# Patient Record
Sex: Male | Born: 1945 | Race: White | Hispanic: No | Marital: Single | Smoking: Current every day smoker
Health system: Southern US, Community
[De-identification: ages and names within clinical notes are randomized; demographics above are authoritative.]

## PROBLEM LIST (undated history)

## (undated) DIAGNOSIS — D696 Thrombocytopenia, unspecified: Secondary | ICD-10-CM

## (undated) DIAGNOSIS — I10 Essential (primary) hypertension: Secondary | ICD-10-CM

## (undated) DIAGNOSIS — F329 Major depressive disorder, single episode, unspecified: Secondary | ICD-10-CM

## (undated) DIAGNOSIS — Z8719 Personal history of other diseases of the digestive system: Secondary | ICD-10-CM

## (undated) DIAGNOSIS — N401 Enlarged prostate with lower urinary tract symptoms: Secondary | ICD-10-CM

## (undated) DIAGNOSIS — K219 Gastro-esophageal reflux disease without esophagitis: Secondary | ICD-10-CM

## (undated) DIAGNOSIS — K760 Fatty (change of) liver, not elsewhere classified: Secondary | ICD-10-CM

## (undated) DIAGNOSIS — C61 Malignant neoplasm of prostate: Secondary | ICD-10-CM

## (undated) DIAGNOSIS — E119 Type 2 diabetes mellitus without complications: Secondary | ICD-10-CM

## (undated) DIAGNOSIS — R3912 Poor urinary stream: Secondary | ICD-10-CM

## (undated) DIAGNOSIS — Z86718 Personal history of other venous thrombosis and embolism: Secondary | ICD-10-CM

## (undated) DIAGNOSIS — F419 Anxiety disorder, unspecified: Secondary | ICD-10-CM

## (undated) DIAGNOSIS — E781 Pure hyperglyceridemia: Secondary | ICD-10-CM

## (undated) DIAGNOSIS — J302 Other seasonal allergic rhinitis: Secondary | ICD-10-CM

## (undated) DIAGNOSIS — K5730 Diverticulosis of large intestine without perforation or abscess without bleeding: Secondary | ICD-10-CM

## (undated) DIAGNOSIS — M1990 Unspecified osteoarthritis, unspecified site: Secondary | ICD-10-CM

## (undated) DIAGNOSIS — F32A Depression, unspecified: Secondary | ICD-10-CM

## (undated) DIAGNOSIS — Z95828 Presence of other vascular implants and grafts: Secondary | ICD-10-CM

## (undated) DIAGNOSIS — G629 Polyneuropathy, unspecified: Secondary | ICD-10-CM

## (undated) DIAGNOSIS — Z973 Presence of spectacles and contact lenses: Secondary | ICD-10-CM

## (undated) HISTORY — PX: OTHER SURGICAL HISTORY: SHX169

## (undated) HISTORY — DX: Polyneuropathy, unspecified: G62.9

## (undated) HISTORY — PX: PROSTATE BIOPSY: SHX241

## (undated) HISTORY — DX: Essential (primary) hypertension: I10

---

## 1971-01-06 HISTORY — PX: CHOLECYSTECTOMY OPEN: SUR202

## 2003-05-12 ENCOUNTER — Emergency Department (HOSPITAL_COMMUNITY): Admission: EM | Admit: 2003-05-12 | Discharge: 2003-05-12 | Payer: Self-pay | Admitting: Emergency Medicine

## 2004-03-27 ENCOUNTER — Ambulatory Visit: Payer: Self-pay | Admitting: Internal Medicine

## 2004-03-27 ENCOUNTER — Ambulatory Visit (HOSPITAL_COMMUNITY): Admission: RE | Admit: 2004-03-27 | Discharge: 2004-03-27 | Payer: Self-pay | Admitting: Internal Medicine

## 2005-04-07 ENCOUNTER — Emergency Department (HOSPITAL_COMMUNITY): Admission: EM | Admit: 2005-04-07 | Discharge: 2005-04-07 | Payer: Self-pay | Admitting: Emergency Medicine

## 2005-11-15 ENCOUNTER — Inpatient Hospital Stay (HOSPITAL_COMMUNITY): Admission: EM | Admit: 2005-11-15 | Discharge: 2005-11-24 | Payer: Self-pay | Admitting: Emergency Medicine

## 2005-11-15 ENCOUNTER — Ambulatory Visit: Payer: Self-pay | Admitting: Internal Medicine

## 2005-12-11 ENCOUNTER — Ambulatory Visit: Payer: Self-pay | Admitting: Internal Medicine

## 2005-12-21 ENCOUNTER — Ambulatory Visit (HOSPITAL_COMMUNITY): Admission: RE | Admit: 2005-12-21 | Discharge: 2005-12-21 | Payer: Self-pay | Admitting: Internal Medicine

## 2006-01-07 ENCOUNTER — Ambulatory Visit (HOSPITAL_COMMUNITY): Admission: RE | Admit: 2006-01-07 | Discharge: 2006-01-07 | Payer: Self-pay | Admitting: General Surgery

## 2006-03-09 ENCOUNTER — Inpatient Hospital Stay (HOSPITAL_COMMUNITY): Admission: AD | Admit: 2006-03-09 | Discharge: 2006-03-19 | Payer: Self-pay | Admitting: Pulmonary Disease

## 2006-03-10 ENCOUNTER — Encounter (INDEPENDENT_AMBULATORY_CARE_PROVIDER_SITE_OTHER): Payer: Self-pay | Admitting: Specialist

## 2006-04-01 ENCOUNTER — Ambulatory Visit (HOSPITAL_COMMUNITY): Admission: RE | Admit: 2006-04-01 | Discharge: 2006-04-01 | Payer: Self-pay | Admitting: General Surgery

## 2006-04-07 ENCOUNTER — Ambulatory Visit (HOSPITAL_COMMUNITY): Admission: RE | Admit: 2006-04-07 | Discharge: 2006-04-07 | Payer: Self-pay | Admitting: Pulmonary Disease

## 2006-06-06 DIAGNOSIS — Z86718 Personal history of other venous thrombosis and embolism: Secondary | ICD-10-CM

## 2006-06-06 HISTORY — DX: Personal history of other venous thrombosis and embolism: Z86.718

## 2006-06-11 ENCOUNTER — Ambulatory Visit: Payer: Self-pay | Admitting: Internal Medicine

## 2006-06-11 ENCOUNTER — Encounter (HOSPITAL_COMMUNITY): Admission: RE | Admit: 2006-06-11 | Discharge: 2006-07-11 | Payer: Self-pay | Admitting: Pulmonary Disease

## 2006-06-11 ENCOUNTER — Ambulatory Visit (HOSPITAL_COMMUNITY): Payer: Self-pay | Admitting: Pulmonary Disease

## 2006-06-11 ENCOUNTER — Ambulatory Visit (HOSPITAL_COMMUNITY): Admission: RE | Admit: 2006-06-11 | Discharge: 2006-06-11 | Payer: Self-pay | Admitting: Pulmonary Disease

## 2006-06-25 ENCOUNTER — Inpatient Hospital Stay (HOSPITAL_COMMUNITY): Admission: AD | Admit: 2006-06-25 | Discharge: 2006-06-30 | Payer: Self-pay | Admitting: Pulmonary Disease

## 2006-06-28 ENCOUNTER — Encounter: Payer: Self-pay | Admitting: Internal Medicine

## 2006-06-28 ENCOUNTER — Ambulatory Visit: Payer: Self-pay | Admitting: Internal Medicine

## 2006-06-29 ENCOUNTER — Ambulatory Visit (HOSPITAL_COMMUNITY): Admission: RE | Admit: 2006-06-29 | Discharge: 2006-06-29 | Payer: Self-pay | Admitting: Family Medicine

## 2006-07-02 DIAGNOSIS — Z95828 Presence of other vascular implants and grafts: Secondary | ICD-10-CM

## 2006-07-02 HISTORY — DX: Presence of other vascular implants and grafts: Z95.828

## 2006-07-02 HISTORY — PX: IVC FILTER INSERTION: CATH118245

## 2007-05-12 ENCOUNTER — Ambulatory Visit (HOSPITAL_COMMUNITY): Admission: RE | Admit: 2007-05-12 | Discharge: 2007-05-12 | Payer: Self-pay | Admitting: Pulmonary Disease

## 2007-05-13 ENCOUNTER — Encounter (HOSPITAL_COMMUNITY): Admission: RE | Admit: 2007-05-13 | Discharge: 2007-06-12 | Payer: Self-pay | Admitting: Pulmonary Disease

## 2007-05-13 ENCOUNTER — Ambulatory Visit (HOSPITAL_COMMUNITY): Payer: Self-pay | Admitting: Pulmonary Disease

## 2007-07-21 ENCOUNTER — Ambulatory Visit (HOSPITAL_COMMUNITY): Admission: RE | Admit: 2007-07-21 | Discharge: 2007-07-21 | Payer: Self-pay | Admitting: Pulmonary Disease

## 2007-07-22 ENCOUNTER — Encounter (HOSPITAL_COMMUNITY): Admission: RE | Admit: 2007-07-22 | Discharge: 2007-08-21 | Payer: Self-pay | Admitting: Oncology

## 2007-07-22 ENCOUNTER — Ambulatory Visit (HOSPITAL_COMMUNITY): Payer: Self-pay | Admitting: Pulmonary Disease

## 2008-01-06 HISTORY — PX: CATARACT EXTRACTION W/ INTRAOCULAR LENS  IMPLANT, BILATERAL: SHX1307

## 2008-08-06 ENCOUNTER — Encounter: Payer: Self-pay | Admitting: Cardiology

## 2008-08-09 ENCOUNTER — Encounter (INDEPENDENT_AMBULATORY_CARE_PROVIDER_SITE_OTHER): Payer: Self-pay | Admitting: *Deleted

## 2008-08-20 DIAGNOSIS — E119 Type 2 diabetes mellitus without complications: Secondary | ICD-10-CM

## 2008-08-20 DIAGNOSIS — K644 Residual hemorrhoidal skin tags: Secondary | ICD-10-CM | POA: Insufficient documentation

## 2008-08-20 DIAGNOSIS — Z860100 Personal history of colon polyps, unspecified: Secondary | ICD-10-CM | POA: Insufficient documentation

## 2008-08-20 DIAGNOSIS — E785 Hyperlipidemia, unspecified: Secondary | ICD-10-CM

## 2008-08-20 DIAGNOSIS — I10 Essential (primary) hypertension: Secondary | ICD-10-CM

## 2008-08-20 DIAGNOSIS — F17200 Nicotine dependence, unspecified, uncomplicated: Secondary | ICD-10-CM | POA: Insufficient documentation

## 2008-08-21 ENCOUNTER — Ambulatory Visit: Payer: Self-pay | Admitting: Internal Medicine

## 2008-08-21 DIAGNOSIS — K5289 Other specified noninfective gastroenteritis and colitis: Secondary | ICD-10-CM

## 2008-08-21 DIAGNOSIS — K5732 Diverticulitis of large intestine without perforation or abscess without bleeding: Secondary | ICD-10-CM

## 2008-08-22 ENCOUNTER — Encounter: Payer: Self-pay | Admitting: Internal Medicine

## 2008-08-22 DIAGNOSIS — R197 Diarrhea, unspecified: Secondary | ICD-10-CM

## 2008-08-24 ENCOUNTER — Encounter: Payer: Self-pay | Admitting: Internal Medicine

## 2008-08-29 ENCOUNTER — Ambulatory Visit (HOSPITAL_COMMUNITY): Admission: RE | Admit: 2008-08-29 | Discharge: 2008-08-29 | Payer: Self-pay | Admitting: Internal Medicine

## 2008-08-29 ENCOUNTER — Ambulatory Visit: Payer: Self-pay | Admitting: Internal Medicine

## 2008-08-29 ENCOUNTER — Encounter: Payer: Self-pay | Admitting: Internal Medicine

## 2008-08-30 ENCOUNTER — Encounter: Payer: Self-pay | Admitting: Internal Medicine

## 2008-08-31 ENCOUNTER — Encounter: Payer: Self-pay | Admitting: Internal Medicine

## 2008-10-09 ENCOUNTER — Ambulatory Visit: Payer: Self-pay | Admitting: Internal Medicine

## 2008-10-14 LAB — CONVERTED CEMR LAB
IgA: 366 mg/dL (ref 68–378)
Tissue Transglutaminase Ab, IgA: 0.9 units (ref <7)

## 2008-11-27 ENCOUNTER — Ambulatory Visit (HOSPITAL_COMMUNITY): Admission: RE | Admit: 2008-11-27 | Discharge: 2008-11-27 | Payer: Self-pay | Admitting: Ophthalmology

## 2008-12-18 ENCOUNTER — Ambulatory Visit (HOSPITAL_COMMUNITY): Admission: RE | Admit: 2008-12-18 | Discharge: 2008-12-18 | Payer: Self-pay | Admitting: Ophthalmology

## 2010-03-28 ENCOUNTER — Emergency Department (HOSPITAL_COMMUNITY): Payer: Medicare Other

## 2010-03-28 ENCOUNTER — Emergency Department (HOSPITAL_COMMUNITY)
Admission: EM | Admit: 2010-03-28 | Discharge: 2010-03-28 | Disposition: A | Discharge status: Home / Self Care | Payer: Medicare Other | Attending: Emergency Medicine | Admitting: Emergency Medicine

## 2010-03-28 DIAGNOSIS — IMO0002 Reserved for concepts with insufficient information to code with codable children: Secondary | ICD-10-CM | POA: Insufficient documentation

## 2010-03-28 DIAGNOSIS — S2249XA Multiple fractures of ribs, unspecified side, initial encounter for closed fracture: Secondary | ICD-10-CM | POA: Insufficient documentation

## 2010-03-28 DIAGNOSIS — Y92009 Unspecified place in unspecified non-institutional (private) residence as the place of occurrence of the external cause: Secondary | ICD-10-CM | POA: Insufficient documentation

## 2010-03-28 DIAGNOSIS — S42033A Displaced fracture of lateral end of unspecified clavicle, initial encounter for closed fracture: Secondary | ICD-10-CM | POA: Insufficient documentation

## 2010-03-28 DIAGNOSIS — E119 Type 2 diabetes mellitus without complications: Secondary | ICD-10-CM | POA: Insufficient documentation

## 2010-03-28 DIAGNOSIS — I10 Essential (primary) hypertension: Secondary | ICD-10-CM | POA: Insufficient documentation

## 2010-03-28 DIAGNOSIS — E785 Hyperlipidemia, unspecified: Secondary | ICD-10-CM | POA: Insufficient documentation

## 2010-03-28 DIAGNOSIS — Z79899 Other long term (current) drug therapy: Secondary | ICD-10-CM | POA: Insufficient documentation

## 2010-03-28 LAB — URINALYSIS, ROUTINE W REFLEX MICROSCOPIC
Glucose, UA: >1000 mg/dL — AB
Leukocytes, UA: NEGATIVE
Nitrite: NEGATIVE
pH: 5 (ref 5.0–8.0)

## 2010-03-28 LAB — URINE MICROSCOPIC-ADD ON

## 2010-04-09 LAB — CBC
HCT: 41 % (ref 39.0–52.0)
MCHC: 35.4 g/dL (ref 30.0–36.0)
MCV: 104.1 fL — ABNORMAL HIGH (ref 78.0–100.0)
Platelets: 80 10*3/uL — ABNORMAL LOW (ref 150–400)
RDW: 17 % — ABNORMAL HIGH (ref 11.5–15.5)

## 2010-04-09 LAB — BASIC METABOLIC PANEL
BUN: 5 mg/dL — ABNORMAL LOW (ref 6–23)
CO2: 28 mEq/L (ref 19–32)
GFR calc non Af Amer: >60 mL/min (ref >60)
Glucose, Bld: 243 mg/dL — ABNORMAL HIGH (ref 70–99)
Potassium: 3.2 mEq/L — ABNORMAL LOW (ref 3.5–5.1)

## 2010-04-09 LAB — POCT I-STAT 4, (NA,K, GLUC, HGB,HCT): Glucose, Bld: 162 mg/dL — ABNORMAL HIGH (ref 70–99)

## 2010-04-12 LAB — OVA AND PARASITE EXAMINATION: Ova and parasites: NONE SEEN

## 2010-04-12 LAB — STOOL CULTURE

## 2010-04-12 LAB — GLUCOSE, CAPILLARY: Glucose-Capillary: 171 mg/dL — ABNORMAL HIGH (ref 70–99)

## 2010-04-12 LAB — FECAL LACTOFERRIN, QUANT: Fecal Lactoferrin: NEGATIVE

## 2010-05-20 NOTE — Op Note (Signed)
NAME:  [name], [name]            ACCOUNT NO.:  [id number]   MEDICAL RECORD NO.:  [id number]          PATIENT TYPE:  AMB   LOCATION:  DAY                           FACILITY:  [facility]   PHYSICIAN:  R. Roetta Sessions, M.D. DATE OF BIRTH:  1945-04-12   DATE OF PROCEDURE:  08/29/2008  DATE OF DISCHARGE:                               OPERATIVE REPORT   PROCEDURE:  Colonoscopy with snare polypectomy, segmental biopsy, stool  sampling.   INDICATIONS FOR PROCEDURE:  A 65 year old gentleman with a history of  nonbloody diarrhea and a history of some degree of colitis, seen at  prior colonoscopies.  Also has a history of colonic adenomas.  He comes  for further evaluation of diarrhea and, in part, for polyp surveillance.  Risks, benefits, alternatives and limitations have been discussed,  questions answered.  Please see the documentation in the medical record.   PROCEDURE NOTE:  O2 saturation, blood pressure, pulse and respirations  were monitored throughout entirety of the procedure.   CONSCIOUS SEDATION:  Versed 5 mg IV, Demerol 75 mg IV in divided doses.   INSTRUMENT:  Pentax video chip system.   FINDINGS:  Digital rectal exam revealed no abnormalities on scout  findings.  The prep was good. Colon:  Colonic mucosa was surveyed from  the rectosigmoid junction to the left transverse, right colon,  appendiceal orifice, ileocecal valve, appendiceal orifice/cecum.  These  structures were well-seen and photographed for the record.  Terminal  ileum was intubated 5 cm.  From this level, the scope was slowly and  cautiously withdrawn.  All previously mentioned mucosal surfaces were  again seen.  The patient has scattered pan colonic diverticula.  Surgical anastomosis identified at 25 cm from the anal verge.  The  terminal ileal mucosa appeared normal.  The patient had multiple polyps  in the ascending and descending segments, which were hot and cold  snared.  Also segmental biopsies of the  ascending and sigmoid were taken  to rule out colitis, but otherwise the colonic mucosa appeared normal.  Scope was pulled down into the rectum, where thorough examination of the  rectal mucosa, including retroflexed view of the anal verge,  demonstrated no abnormalities.  Rectal biopsies were also taken.  A  fresh stool specimen was submitted to the microbiology lab.  The patient  also had some minimal internal hemorrhoids.  The patient tolerated the  procedure well.  Cecal withdrawal time 19 minutes.   IMPRESSION:  1. Minimal anal canal internal hemorrhoids, otherwise normal-appearing      rectum, status post biopsy.  2. Surgical anastomosis 25 cm.  3. Pancolonic diverticula.  4. Multiple polyps in the descending and ascending segments, status      post hot and cold snare polypectomies.  5. The remaining colonic mucosa appeared normal, status post stool      sampling and segmental biopsies described above.  6. Normal terminal ileum.  7. Cecal withdrawal time 19 minutes.   RECOMMENDATIONS:  1. Diverticulosis, hemorrhoids and polyp literature provided to Mr.      Lienau.  2. No aspirin or NSAIDs for next 5 days.  3. Follow-up on pathology and stool samples.  4. Further recommendations to follow.      Jonathon Bellows, M.D.  Electronically Signed     RMR/MEDQ  D:  08/29/2008  T:  08/29/2008  Job:  [id number]   cc:   Ramon Dredge L. Juanetta Gosling, M.D.  Fax: [phone]

## 2010-05-20 NOTE — Group Therapy Note (Signed)
NAMEKENZO, OZMENT            ACCOUNT NO.:  [id number]   MEDICAL RECORD NO.:  [id number]          PATIENT TYPE:  INP   LOCATION:  206                           FACILITY:  [facility]   PHYSICIAN:  Edward L. Juanetta Gosling, M.D.DATE OF BIRTH:  1946/01/04   DATE OF PROCEDURE:  DATE OF DISCHARGE:                                 PROGRESS NOTE   Mr. Jeff is better.  He did not sleep well last night but otherwise  he is okay.   EXAM:  Temperature is 97.6, pulse 77, respirations 20, blood pressure  153/90, O2 sats 96% on room air.  His hemoglobin 10.9, white count is  3000.  His chest is clear.  He looks better.   ASSESSMENT:  He is much improved.   PLAN:  He is set for a venous filter today.      Edward L. Juanetta Gosling, M.D.  Electronically Signed     ELH/MEDQ  D:  06/29/2006  T:  06/29/2006  Job:  [id number]

## 2010-05-20 NOTE — Group Therapy Note (Signed)
[name], [name]            ACCOUNT NO.:  [id number]   MEDICAL RECORD NO.:  [id number]          PATIENT TYPE:  INP   LOCATION:  A206                          FACILITY:  [facility]   PHYSICIAN:  Edward L. Juanetta Gosling, M.D.DATE OF BIRTH:  Oct 01, 1945   DATE OF PROCEDURE:  DATE OF DISCHARGE:                                 PROGRESS NOTE   [name] says he is feeling okay.  He is set for EGD and  colonoscopy today.  He has no other new complaints.   PHYSICAL EXAMINATION:  VITAL SIGNS:  This morning shows temperature  97.9, pulse 74, respirations 20, blood pressure 151/90, O2 sats 95% on  room air, blood sugar 99.  CHEST:  Clear.  HEART:  Regular.  ABDOMEN:  Soft.   He is apparently not having much bleeding anymore.  His hemoglobin level  is up at 10.5, platelets 140, white count still low at 2600.   ASSESSMENT:  I think he has significant liver problem related to his  history of alcohol use, and I suspect that that is why his prothrombin  time went out so far so fast.  It was so difficult to correct.  I am not  going to plan to put him back on Coumadin.  I am going to see about  getting a venal cava filter done.  His white blood count is low.  This  has been a chronic problem as well.  He is to have colonoscopy and  perhaps EGD today.      Edward L. Juanetta Gosling, M.D.  Electronically Signed     ELH/MEDQ  D:  06/28/2006  T:  06/28/2006  Job:  [id number]

## 2010-05-20 NOTE — Op Note (Signed)
NAME:  [name], [name]            ACCOUNT NO.:  [id number]   MEDICAL RECORD NO.:  [id number]          PATIENT TYPE:  INP   LOCATION:  A206                          FACILITY:  [facility]   PHYSICIAN:  R. Roetta Sessions, M.D. DATE OF BIRTH:  1945-02-11   DATE OF PROCEDURE:  06/28/2006  DATE OF DISCHARGE:                               OPERATIVE REPORT   PROCEDURE:  Colonoscopy with ileoscopy, snare polypectomy, biopsy.   INDICATIONS FOR PROCEDURE:  65 year old Caucasian male recently started  on Coumadin, presenting coagulopathic with hematochezia requiring  transfusion.  He required reversal of coagulopathy with vitamin K and  FFP and remains hemodynamically stable. Colonoscopy is now being done  with possible EGD to follow. As discussed potential risks, benefits and  alternatives have been reviewed.  Please see documentation in medical  record.   PROCEDURE NOTE:  O2 saturation, blood pressure, pulse and respirations  were monitored throughout the entire procedure.  Conscious sedation  Versed 4 mg IV, Demerol 75 mg in divided doses.  Instrument Pentax video  chip system.  Digital rectal exam revealed no abnormalities.   ENDOSCOPIC FINDINGS:  Prep was suboptimal. Colon:  Colon mucosa was  surveyed from the rectosigmoid junction through the left transverse,  right colon to appendiceal orifice, ileocecal valve and cecum.  The  structures well seen photographed for the record.  Terminal ileum was  intubated at 10 cm. From this level scope was withdrawn.  Previous  mucosal surfaces were again seen.  The patient had no residual pan  colonic diverticula.  Surgical anastomosis identified at 25 cm appeared  normal.  The patient multiple pedunculated polyps. Lesions were in the  ascending and left colon. Largest was 1 cm in the descending colon. They  were all hot snare removed.  There was one in the ascending colon wanted  to ooze after polypectomy was performed requiring one resolution clip  with good hemostasis.  The remainder of the colonic mucosa appeared  normal.  In the terminal ileum there was one 3 mm protuberant polyps on  folds likely not clinically significant however, one was biopsied.   The scope was pulled down the rectum where thorough examination of the  rectal mucosa including retroflex view anal verge demonstrated no  abnormalities.   Please note there was no blood in the lower GI tract.  The patient  tolerated the procedure very well and was reactive in endoscopy.   IMPRESSION:  Normal rectum status post prior sigmoid resection and  anastomosis appeared normal at 25 cm, multiple pedunculated colonic  polyps and residual pan colonic diverticula.  All polyps removed with  snare.  One oozing polyp in the ascending colon required resolution  clipping. The diminutive polyps in the terminal ileum of uncertain  significance, one biopsied.   Suspect the patient did suffer lower GI tract etiology bleed secondary  to polyps and or diverticula in setting of marked over anticoagulation.   RECOMMENDATIONS:  1. Agree with no more anticoagulation and pursuing of a [place]      filter.  2. Will allow advancement diet, n.p.o. past midnight in light for  filter placement tomorrow. No MRI until clip is known to have      passed.  3. CBC in the morning.  4. Discussed findings and recommendations with Edward L. Juanetta Gosling, M.D.  5. As a separate issue would consider further evaluation of his      pancytopenia.  6. Follow-up on pathology.      Jonathon Bellows, M.D.  Electronically Signed     RMR/MEDQ  D:  06/28/2006  T:  06/28/2006  Job:  [id number]   cc:   Ramon Dredge L. Juanetta Gosling, M.D.  Fax: [phone]   Barbaraann Barthel, M.D.  Fax: [phone]   Ladona Horns. Mariel Sleet, MD  Fax: [phone]

## 2010-05-20 NOTE — H&P (Signed)
NAMEJAVIAN, [name]            ACCOUNT NO.:  [id number]   MEDICAL RECORD NO.:  [id number]          PATIENT TYPE:  INP   LOCATION:  A206                          FACILITY:  [facility]   PHYSICIAN:  Edward L. Juanetta Gosling, M.D.DATE OF BIRTH:  May 12, 1945   DATE OF ADMISSION:  06/24/2006  DATE OF DISCHARGE:  LH                              HISTORY & PHYSICAL   HISTORY OF PRESENT ILLNESS:  Mr. Tweed is a 65 year old who is being  admitted because of GI bleeding.  He has a history of diabetes,  hypertension, hyperlipidemia, distal ulcerative colitis, diverticulitis.  He had been undergoing a workup for edema of his legs and was found to  have a blood clot, was started on Lovenox and then Coumadin.  Called my  office today, said that he was having trouble with bleeding from his  rectum and his prothrombin time was 60 seconds with an INR of 6, so we  brought him into the hospital.  He is going to be watched carefully for  significant bleeding, whether he needs blood transfusions.  He is going  to get the vitamin K.   PAST MEDICAL HISTORY:  1. Diabetes.  2. Hypertension.  3. Ulcerative colitis.  4. Diverticulitis.  5. Hyperlipidemia .  6. He had a resection of his distal colon in March of this year.   SOCIAL HISTORY:  Works in a bank.  He drinks alcohol occasionally.  Smokes about a pack of cigarettes daily.   MEDICATIONS:  1. Glyburide.  2. Pravachol.  3. Coumadin.   REVIEW OF SYSTEMS:  Otherwise, pretty much negative.   PHYSICAL EXAMINATION:  VITAL SIGNS:  temperature 98.4, pulse 85,  respirations 20, blood pressure 141/95, O2 sats 96% on room air.  HEENT:  Shows his pupils are equal round react to light and  accommodation.  Nose and throat are clear.  NECK:  Supple.   CHEST:  Clear without wheezes.  HEART:  Regular without murmur, gallop or rubs.  ABDOMEN:  Soft.  RECTAL:  Stool was heme-positive, red, looks like from hemorrhoidal-type  bleeding.  EXTREMITIES:  Do not show  any edema now, and his bowel sounds are  present and active.   LABORATORY WORK:  White blood count is 4700, hemoglobin 11.3 potassium  is 3.3, so that is going to need to be replaced.  Albumin is 2.6.   ASSESSMENT:  He is over anticoagulated.  He does have a blood clot.  He  is going to get some vitamin K.  I am going to replace his potassium and  will repeat labs in the morning.  He is going to have q.4 h  hemoglobin/hematocrit right now.  If his hemoglobin drops, he is going  to need a blood transfusion, and I will have him rechecked in the  morning.  He may be able to be discharged if he improves.      Edward L. Juanetta Gosling, M.D.  Electronically Signed     ELH/MEDQ  D:  06/24/2006  T:  06/25/2006  Job:  [id number]

## 2010-05-20 NOTE — Group Therapy Note (Signed)
[name], MOUNGER            ACCOUNT NO.:  [id number]   MEDICAL RECORD NO.:  [id number]          PATIENT TYPE:  INP   LOCATION:  206                           FACILITY:  [facility]   PHYSICIAN:  Edward L. Juanetta Gosling, M.D.DATE OF BIRTH:  1945-10-22   DATE OF PROCEDURE:  DATE OF DISCHARGE:                                 PROGRESS NOTE   Mr. Marple had his a vena caval filter done yesterday and he is doing  okay.  He has no complaints.   EXAM:  Shows temperature is 97, pulse 76, respirations 18, blood  pressure 159/91, O2 sats 100% on room air.  Blood sugar 136.  White  count is 2900, hemoglobin 10.1, platelets 126,000, and his electrolytes  yesterday were normal.   ASSESSMENT:  He is better.   PLAN:  To discharge home today.  Please see discharge summary for  details.      Edward L. Juanetta Gosling, M.D.  Electronically Signed     ELH/MEDQ  D:  06/30/2006  T:  06/30/2006  Job:  [id number]

## 2010-05-20 NOTE — Group Therapy Note (Signed)
[name], [name]            ACCOUNT NO.:  [id number]   MEDICAL RECORD NO.:  [id number]          PATIENT TYPE:  INP   LOCATION:  A206                          FACILITY:  [facility]   PHYSICIAN:  Edward L. Juanetta Gosling, M.D.DATE OF BIRTH:  May 24, 1945   DATE OF PROCEDURE:  DATE OF DISCHARGE:                                 PROGRESS NOTE   PROBLEM:  GI bleeding, over anticoagulation.   SUBJECTIVE:  [name] continues to have some bleeding.  He has  received vitamin K twice, but now considering the fact that he is having  a significant bleed,  I think we need to go ahead and get a give him  some fresh frozen plasma.  I am going to have GI see him as well.  He  said he wants to eat.  I told him I do not think that is going to be  possible yet.   EXAM:  Today shows his temperature is 97, pulse 73, respirations 16,  blood pressure 144/97.  White count 3300, hemoglobin 9.4 from 11.31 when  we started yesterday, INR is still 5.6 despite vitamin K x2 although the  second dose was this morning.   ASSESSMENT:  He has significant bleeding and he still bleeding.   I am going to ask for GI consultation continue checking his  hemoglobin/hematocrit give him a blood transfusion if he gets below 09.  We will give him some fresh frozen plasma to go ahead and fully correct  his INR, and continue with everything else.  He says he has some pain  and he has a high tolerance pain medications, et Karie Soda, so I am going  to give him some morphine.  He is on [facility] standing orders already.      Edward L. Juanetta Gosling, M.D.  Electronically Signed     ELH/MEDQ  D:  06/25/2006  T:  06/25/2006  Job:  [id number]

## 2010-05-20 NOTE — Group Therapy Note (Signed)
NAMECAMERIN, LADOUCEUR            ACCOUNT NO.:  [id number]   MEDICAL RECORD NO.:  [id number]          PATIENT TYPE:  INP   LOCATION:  A206                          FACILITY:  [facility]   PHYSICIAN:  Edward L. Juanetta Gosling, M.D.DATE OF BIRTH:  15-Jan-1945   DATE OF PROCEDURE:  06/27/2006  DATE OF DISCHARGE:                                 PROGRESS NOTE   [name] has continued to have some bleeding.  He got 1 unit of  blood yesterday.  He is set for colonoscopy and then, if needed, an  upper endoscopy tomorrow.  He is probably going to need a vena caval  filter because I am very concerned about putting him back on Coumadin.   EXAM:  His exam today shows temperature is 97.8, pulse 75, respirations  20, blood pressure 153/81, O2 sat 95% on room air.  CHEST:  His chest is clear.  HEART:  Heart is regular.  ABDOMEN:  His abdomen soft.  He does not have any tenderness.  EXTREMITIES:  He still has edema of both legs.   His white blood count is 2800, hemoglobin 9, hematocrit 25.4, platelets  114.  It should be noted that he had low white blood cell count, etc.,  when he was in the hospital previously which was thought to be related  to use of alcohol.  I do not plan to change anything today.      Edward L. Juanetta Gosling, M.D.  Electronically Signed     ELH/MEDQ  D:  06/27/2006  T:  06/27/2006  Job:  [id number]

## 2010-05-20 NOTE — Discharge Summary (Signed)
[name], [name]            ACCOUNT NO.:  [id number]   MEDICAL RECORD NO.:  [id number]          PATIENT TYPE:  INP   LOCATION:  A206                          FACILITY:  [facility]   PHYSICIAN:  Edward L. Juanetta Gosling, M.D.DATE OF BIRTH:  10/30/1945   DATE OF ADMISSION:  06/24/2006  DATE OF DISCHARGE:  06/25/2008LH                               DISCHARGE SUMMARY   FINAL DISCHARGE DIAGNOSES:  1. Gastrointestinal bleeding secondary to polyps.  2. Over anticoagulation.  3. Thrombophlebitis.  4. Diabetes.  5. Hypertension.  6. Hyperlipidemia.  7. Distal ulcerative colitis.  8. Diverticulosis status post sigmoid resection.  9. Pancytopenia mild, probably related to use of alcohol.  10.Hypokalemia.   HISTORY:  Mr. Wegner is a 65 year old who had been in his usual state  of fairly poor health having had a sigmoid resection for diverticulitis  and then ended up with a bleeding; and who had been started on Coumadin  because of a clot in his leg; was started on Coumadin, was  anticoagulated and then called my office with rectal bleeding.  He was  found to have a prothrombin time of 60 seconds with an INR of 6.  He was  brought into the hospital at that point.   ADMISSION PHYSICAL EXAMINATION:  A well-developed, well-nourished male  who did not appear to be in any acute distress.  VITAL SIGNS:  Temperature is 98.4, pulse 85, respirations 20, blood  pressure 141/95, O2 sat was 96% on room air.  RECTAL:  His stool was heme positive, red; looked like from more of a  hemorrhoidal type bleeding.  Rectal exam otherwise was negative.  ABDOMEN:  His abdomen was soft.  EXTREMITIES:  Showed no edema.   LABORATORY DATA:  His white blood count was 4700 and later came down;  hemoglobin 11.3, potassium 3.3.   HOSPITAL COURSE:  He was over anticoagulated, so he was given vitamin K.  Potassium was replaced.  He had significant drop in his hemoglobin and  hematocrit and ended up having to have several  units of blood.  He had  GI consultation and eventually underwent a colonoscopy.  He was found to  have polyps in his colon which were removed.  One of these required a  metal staple.  Because of concerns about putting him back on Coumadin,  he underwent a venous filter procedure on the 24th, tolerated all this  well.  By the time of discharge, his potassium was over 3.5.  His  hemoglobin was over 10.   He is discharged home in improved condition on no Coumadin, no aspirin  for at least 2 weeks, and he is going to take iron over-the-counter one  daily, lisinopril 10 mg daily, Pravachol 40 mg daily, glyburide 2.5 mg  daily and he can take Tylenol for pain.  He is told that he can start  his activity, getting up and moving around and I will see him back in my  office next week.      Edward L. Juanetta Gosling, M.D.  Electronically Signed     ELH/MEDQ  D:  06/30/2006  T:  06/30/2006  Job:  [id number]

## 2010-05-20 NOTE — Consult Note (Signed)
[name], [name]            ACCOUNT NO.:  [id number]   MEDICAL RECORD NO.:  [id number]          PATIENT TYPE:  INP   LOCATION:  A206                          FACILITY:  [facility]   PHYSICIAN:  Kassie Mends, M.D.      DATE OF BIRTH:  [birth year]   DATE OF CONSULTATION:  06/25/2006  DATE OF DISCHARGE:                                 CONSULTATION   REASON FOR CONSULTATION:  Rectal bleeding.   HPI:  [name] is a 65 year old male who tells me 2 nights ago he  had sushi, he went to the bathroom and passed a large amount of pure  blood which was bright red and fresh, there were no clots, it did fill  the commode.  Since that time he has had about 10-12 episodes total.  He  complains of low bilateral abdominal pain, feels like hunger pains.  He  also complains of indigestion and heartburn, denies any dysphagia or  odynophagia and denies any anorexia, any fever, does have some chills.  He has been on Coumadin for about 2 weeks now.  Upon admission his INR  was found to be 5.6, he was given Vitamin K 10 mg this morning and  yesterday for a total of 2 doses.  He denies any nausea, vomiting or  diarrhea.  He did lose some weight after his sigmoid resection in March.  However, he tells me he has been able to gain a lot of this back.  Hemoglobin is down to 9.4 from 11.3 yesterday.   PAST MEDICAL/SURGICAL HISTORY:  1. He has a remote history of distal UC in March of 2008.  2. He had a sigmoid resection and drainage of a psoas muscle abscess.  3. He had history of recurrent refractory diverticulitis.  4. His last colonoscopy was March 27, 2004.  He was found to have      pancolonic diverticula and 4 adenomatous polyps as well as an      external hemorrhoid.  Post surgery he developed DVT and was placed      on Lovenox and then Coumadin.  5. He has a history of diabetes mellitus, hypertension and      hyperlipidemia.   MEDICATIONS PRIOR TO ADMISSION:  1. Coumadin 5 mg daily.  2.  Glyburide 2.5 mg daily.  3. Pravastatin 40 mg daily.  4. Levsin p.r.n.  5. Darvocet p.r.n.  6. Percocet p.r.n.  7. Vicodin p.r.n.   ALLERGIES:  FLAGYL CAUSES NAUSEA AND VOMITING.   FAMILY HISTORY:  Positive for 1 cousin and 2 maternal aunts with colon  cancer.  No first degree relatives.   SOCIAL HISTORY:  1. Mr. Marbach has a 40 pack year history of tobacco use.  2. Denies any drug use.  3. He does consume a couple of alcoholic beverages every day or every      other day.  4. He works for [employer].  5. He is single.  6. He lives alone.   REVIEW OF SYSTEMS:  See HPI, otherwise negative.   PHYSICAL EXAM:  VITAL SIGNS:  Weight 86.1 kg, height 74 inches, temp 97,  pulse 72, respirations 19, blood pressure 161/94.  GENERAL:  [name] is an alert, oriented, pleasant, cooperative  Caucasian male in no acute distress.  HEENT:  Sclera clear, nonicteric, conjunctiva pink.  Oropharynx pink and  moist without any lesions.  NECK:  Supple without any mass or thyromegaly.  CHEST:  Heart regular rate and rhythm, normal S1, S2 without any  murmurs, clicks, rubs or gallops.  LUNGS:  Clear to auscultation bilaterally.  ABDOMEN:  Positive bowel sounds x4, no bruits auscultated.  He does have  mild left lower quadrant tenderness, mild suprapubic tenderness, no  rebound, tenderness or guarding, no hepatosplenomegaly or masses.  EXTREMITIES:  Without clubbing or edema bilaterally.  SKIN:  Pale, warm and dry.   LABORATORY STUDIES:  The WBC is 3.3, hemoglobin 9.4, hematocrit 25.8 and  platelets 142,000.  Calcium 7.5, sodium 138, potassium 3.3, chloride  104, CO2 25, BUN 4, creatinine 0.89, glucose 139.  Total bilirubin 0.7,  alkaline phosphatase 64, AST 37, ALT 18, total protein 5.7, albumin 2.6.   IMPRESSION:  [name] is a 65 year old male who is 3 months status  post a sigmoid resection for refractory/recurrent diverticulitis with 2  days of passing bright red blood per rectum.   His hemoglobin is down to  9.4 today, his INR is 5.6 due to Coumadin for 2 weeks for a  postoperative deep vein thrombosis.  Etiology of his bleeding is  questionable at this point, most likely diverticulosi. He does have a  remote history of distal ulcerative colitis or less likely rapid transit  upper gastrointestinal bleed.   PLAN:  1. Agree with Vitamin K to get INR down to therapeutic level.  2. Transfuse if hemoglobin is less than 9.  3. If bleeding persists, will require flex-sig for further      evaluations.  Add daily PPI.  4. Will continue to monitor for signs of regularly declining      hemoglobin.  At that point would consider EGD as well.   We would like to thank Dr. Juanetta Gosling for allowing [place] to participate in the  care of Mr. Weisenberger.      Lorenza Burton, N.P.      Kassie Mends, M.D.  Electronically Signed    KJ/MEDQ  D:  06/25/2006  T:  06/25/2006  Job:  [id number]   cc:   Ramon Dredge L. Juanetta Gosling, M.D.  Fax: [phone]

## 2010-05-20 NOTE — Procedures (Signed)
NAME:  JAKEOB, TULLIS NO.:  [id number]   MEDICAL RECORD NO.:  [id number]          PATIENT TYPE:  OUT   LOCATION:  RAD                           FACILITY:  [facility]   PHYSICIAN:  Pricilla Riffle, MD, FACCDATE OF BIRTH:  05-10-1945   DATE OF PROCEDURE:  06/11/2006  DATE OF DISCHARGE:                                ECHOCARDIOGRAM   INDICATIONS:  The patient is a 65 year old being evaluate for edema.  Test to evaluate LV function.   Two-dimensional echocardiogram:  Left ventricle is normal in size with  an end-diastolic dimension of 50 mm.  The interventricular septum and  posterior wall are moderate to severely thickened at 18 mm and 16 mm  each.   Left atrium is normal.  Right atrium, right ventricle are normal.   The aortic valve was mildly thickened, calcified.  It is not stenotic.  There is no insufficiency.   Mitral valve is mildly thickened with mild annular calcification.  There  is no insufficiency.  Pulmonic valve grossly normal.  Tricuspid valve is  normal with trace insufficiency.   Overall, LV systolic function is normal with an LVEF of approximately 60-  65%.  There is mild diastolic dysfunction.  RVEF is normal.   IVC is normal.  No pericardial effusion is seen.      Pricilla Riffle, MD, [facility]  Electronically Signed     PVR/MEDQ  D:  06/14/2006  T:  06/15/2006  Job:  [id number]

## 2010-05-23 NOTE — Group Therapy Note (Signed)
NAMEOLIVIER, [name] NO.:  [id number]   MEDICAL RECORD NO.:  [id number]          PATIENT TYPE:  INP   LOCATION:  IC09                          FACILITY:  [facility]   PHYSICIAN:  Catalina Pizza, M.D.        DATE OF BIRTH:  1945-12-20   DATE OF PROCEDURE:  03/13/2006  DATE OF DISCHARGE:                                 PROGRESS NOTE   SUBJECTIVE:  [name] is a white male post-op day 3 from a sigmoid  resection secondary to diverticular disease on March 10, 2006, performed  by Dr. Malvin Johns and Dr. Lovell Sheehan.  Apparently, post-op the patient had  some complications resulting in reintubation but has since been doing  well, is extubated and improving.  He is not having any significant  problems with confusion or being disoriented as he was yesterday.   OBJECTIVE:  VITAL SIGNS:  Blood pressure is 142/86, heart rate is 90,  temperature is 97.1 has remained afebrile, sating 92 to 95% on room air.  GENERAL:  [name] is a white male lying in bed in no acute distress.  HEENT:  Unremarkable.  He does have right IJ in with no significant  erythema, some old bloody drainage.  He has an NG tube in place at [name]  time.  The mucous membranes are moist.  Oropharynx is clear.  LUNGS:  Show a few crackles at the bases bilaterally.  HEART:  Regular rate and rhythm.  No murmurs, gallops, or rubs.  ABDOMEN:  Soft, nontender, nondistended with positive bowel sounds.  He  does have a large bandage in the midline with a drain in place, draining  serosanguineous type fluid.  The bandage is clean, dry and intact.  EXTREMITIES:  No lower extremity edema.  NEUROLOGIC:  The patient is alert and oriented  x3.  No deficits  appreciated.  Does have some fidgeting-type nature with tubes coming out  his nose and tape.   LABORATORY:  Work attained showed a CBC showed a white count of 4.3,  hemoglobin 9.4, platelet count 224.  B-MET shows sodium 136, potassium  4.1, chloride 108, CO2 25, glucose 156, BUN 6,  creatinine 1.14, calcium  is 7.3.   IMPRESSION:  [name] is a 65 year old white male status post a  sigmoidectomy due to diverticular disease with a small abscess in the  psoas area.   ASSESSMENT/PLAN:  1. Recent abdominal surgery.  Followed by Dr. Malvin Johns and is      adjusting his medications as well as when the patient will be      appropriate to start advancing his diet as well as when the patient      will be able to get off ICU and get up out of bed.  I emphasized to      the patient that once [name] stepwise process goes, will be able to      get more routinely as well as get the NG tube out and get the tube      out of his bladder when felt appropriate from surgery's standpoint.  2. Recent confusion.  At [name] time,  he is not having any confusion.      Question whether it was related to ICU confusion versus withdrawal      type symptoms, although Dr. Juanetta Gosling did not feel that [name]      [name] was a significant drinker.  He did start a multivitamin      and thiamine and may use Ativan as needed if he does have some      worsening confusion.  3. Diabetes mellitus, type 2.  Post surgery need to keep close check      on his blood sugars.  Blood sugars have been 132, 118, 156, and      171, respectively.  We will continue to manage routinely with      sliding scale insulin since      the patient is not having significant oral intake at [name] time.  4. Psoas abscess.  He is continued on Cipro intravenous and has      remained afebrile for now I agree with [name] antibiotic choice.  We      will continue to monitor.      Catalina Pizza, M.D.  Electronically Signed     ZH/MEDQ  D:  03/13/2006  T:  03/13/2006  Job:  [id number]

## 2010-05-23 NOTE — Group Therapy Note (Signed)
NAMECORDAY, WYKA            ACCOUNT NO.:  [id number]   MEDICAL RECORD NO.:  [id number]          PATIENT TYPE:  INP   LOCATION:  A214                          FACILITY:  [facility]   PHYSICIAN:  Edward L. Juanetta Gosling, M.D.DATE OF BIRTH:  Jun 13, 1945   DATE OF PROCEDURE:  DATE OF DISCHARGE:                                   PROGRESS NOTE   Mr. Crotteau is sleepy, I suspect because he is on the delirium tremens  protocol.  He otherwise says he has had some mild abdominal pain but no  other problems.  His temperature is 97.7, pulse 83, respirations 18, blood  sugar 82, blood pressure 109/75.  His chest is clear.  His heart is regular.  His abdomen is mildly tender.  Extremities showed no edema and his CNS shows  that he is sleepy.  His white count is 2,700, hemoglobin 11.7 platelets 143,  sodium down to 129, potassium is 3.4, glucose 129 on the B-met with BUN of  4, creatinine 0.8.  His albumin is 2.3.  My assessment is that he has  diverticulitis.  He has a history of significant alcohol consumption so he  is being treated with DT prophylaxis.  He has probable COPD.  He has  diabetes, which is well-controlled.  He has hypertension, which is well-  controlled.  He is hyponatremic.  He is hypokalemic, he is leukocytopenic,  and thrombocytopenic.  I suspect that the leukocytopenia, thrombocytopenia  are related to his problems with alcohol.  Will plan to continue the DT  prophylaxis protocol, to get him on multiple vitamin, folic acid, etc,  replace his potassium, continue his antibiotics, continue with his  treatments for blood sugar and follow.      Edward L. Juanetta Gosling, M.D.  Electronically Signed     ELH/MEDQ  D:  11/18/2005  T:  11/18/2005  Job:  [id number]

## 2010-05-23 NOTE — H&P (Signed)
NAMETRAVES, MAJCHRZAK            ACCOUNT NO.:  [id number]   MEDICAL RECORD NO.:  [id number]          PATIENT TYPE:  INP   LOCATION:  A331                          FACILITY:  [facility]   PHYSICIAN:  Edward L. Juanetta Gosling, M.D.DATE OF BIRTH:  [birth year]   DATE OF ADMISSION:  03/09/2006  DATE OF DISCHARGE:  LH                              HISTORY & PHYSICAL   REASON FOR ADMISSION:  Patient being admitted for diabetes control and  bowel prep with significant trouble with hypoglycemia prior to surgery.   HISTORY OF PRESENT ILLNESS:  He is a 65 year old who has multiple  medical problems including diabetes, hypertension, hyperlipidemia,  distal ulcerative colitis and was hospitalized in November of this year  and was found to have significant areas of diverticulitis, possibly some  abscess formation, and he has been following with Dr. Perrin Maltese.  Plans  had initially been made for him to have surgery done in the [facility]  area, but after some thought about it, he wants Dr. Malvin Johns to do his  surgery.  He is being admitted because he is diabetic, he has had  significant hypoglycemia and in fact, had hypoglycemia last night while  he was taking his GoLYTELY and had to bring his blood sugar with orange  juice.   PAST MEDICAL HISTORY:  His past medical history, as mentioned, is  positive for diabetes, hypertension, ulcerative colitis, diverticulitis,  hyperlipidemia and he had a colonoscopy in March 2006.   SOCIAL HISTORY:  He works in a bank.  He smokes about a package of  cigarettes daily.  He drinks alcohol occasionally.   REVIEW OF SYSTEMS:  Other than as mentioned, is pretty much negative.   CURRENT MEDICATIONS:  1. Glyburide 2.5 mg daily.  2. Pravachol 40 mg daily.   PHYSICAL EXAM:  GENERAL:  He looks much better than at the time of his  discharge from the hospital last November.  His nutrition looks better.  He seems to feel better.  VITAL SIGNS:  His temperature is 95.7, pulse 105,  respirations 20, blood  sugar 80, blood pressure 100/72, O2 SAT 98%, weight 79.9.  HEENT:  His mucous membranes are mildly dry.  His nose and throat are  clear.  NECK:  Supple without masses.  CHEST:  Clear with some rhonchi, no wheezing.  HEART:  Regular without murmur, gallop or rub.  ABDOMEN:  Soft.  No masses are felt.  EXTREMITIES:  No clubbing or cyanosis.  He has 1 to 2+ edema of both  legs.  I have told him this is probably protein-calorie problem.  At his  last admission, this was complicated by thrombocytopenia and leukopenia,  which were thought to be secondary to the seriousness of infection and  probably to alcohol use.   LABORATORY WORK:  White count 11,000, hemoglobin 13.7, platelets  316,000.  Sodium is 134, potassium 3.4, glucose was 54, BUN of 10,  creatinine 0.9, albumin of 2.   ASSESSMENT:  He has significant problems with hypoglycemia.  He is going  to be on the hypoglycemia protocol.  We are going to stop his glyburide,  hold  his Pravachol; he does not need it right now.  He is going to have  a bowel preparation and be sent for surgery tomorrow.      Edward L. Juanetta Gosling, M.D.  Electronically Signed     ELH/MEDQ  D:  03/09/2006  T:  03/10/2006  Job:  [id number]

## 2010-05-23 NOTE — Group Therapy Note (Signed)
[name]            ACCOUNT NO.:  [id number]   MEDICAL RECORD NO.:  [id number]          PATIENT TYPE:  INP   LOCATION:  IC09                          FACILITY:  [facility]   PHYSICIAN:  Edward L. Juanetta Gosling, M.D.DATE OF BIRTH:  May 13, 1945   DATE OF PROCEDURE:  DATE OF DISCHARGE:                                 PROGRESS NOTE   [name] is a bit confused and somewhat disoriented this morning.  He says that he did not sleep well because he was in a new room.  His  exam shows that he is awake, alert but confused, as mentioned.  He has  no other new complaints.  His exam shows blood pressure 146/92.  His  pulse rate 92, temperature 36.9, white count is down to 4200 from a peak  of 11,000.  His I&O +896 on the 5th, +37 and 46 on the 6th, and negative  very slightly so far today.  His weight is up to 85.7 kg from 79.9.  Hemoglobin is 9.9, platelets 209.  pH is 7.39, pCO2 of 37.8, pO2 was 117  yesterday.  His chest is clear.  His heart is regular.  His abdomen  soft.  The rest of his exam is unchanged.  I did not examine his  abdomen.   ASSESSMENT:  I think he is doing fair.  One of the concerns is that I  think he may be having some of the ICU disorientation, but he also has a  history of moderate alcohol consumption, so I am going to go and give  him vitamins and thiamine just to be sure.  I do think this has much  more typical pattern of a ICU confusion, but I want to be sure.  He is  very calm but mildly confused.   PLAN:  My plan then is to add the MVI, add thiamine, continue his other  treatments.  I will be out of town starting this afternoon.  Dr. Catalina Pizza will cover for me.      Edward L. Juanetta Gosling, M.D.  Electronically Signed     ELH/MEDQ  D:  03/12/2006  T:  03/12/2006  Job:  [id number]

## 2010-05-23 NOTE — Group Therapy Note (Signed)
[name], CEASE NO.:  [id number]   MEDICAL RECORD NO.:  [id number]          PATIENT TYPE:  INP   LOCATION:  A314                          FACILITY:  [facility]   PHYSICIAN:  Catalina Pizza, M.D.        DATE OF BIRTH:  10-24-1945   DATE OF PROCEDURE:  03/16/2006  DATE OF DISCHARGE:                                 PROGRESS NOTE   Mr. Nichelson is a pleasant white male, postop day 6 from sigmoid  resection secondary to diverticular disease by Dr. Malvin Johns.  At this  time, he is out of the unit, on the floor, and does have NG tube  replaced and started on a clear liquid diet to see if he tolerates this  well.  At this point, the patient has no complaints related to the  surgery.  He does continue to complain about burning sensation and pain  in his thigh with ambulation which apparently has been there for quite  some time, and Dr. Juanetta Gosling has been apprised of this.   PHYSICAL EXAMINATION:  VITAL SIGNS:  Temperature is 97, blood pressure  is 143/83, pulse 90, respirations 20.  CBGs 180, 220, and 186,  respectively.  97% on room air.  GENERAL:  This is a thin white male in no acute distress.  HEENT:  Unremarkable.  LUNGS:  Good air movement throughout.  No crackles appreciated.  HEART:  Regular rate and rhythm.  No murmurs, gallops, or rubs.  ABDOMEN:  Soft, with midline scar from recent surgery.  No acute  problems noted.  EXTREMITIES:  No lower extremity edema.  NEUROLOGIC:  Alert and oriented x3.  No further confusion.  MUSCULOSKELETAL/SKIN:  No significant findings on exam of left leg.  Appears to be neuropathic in nature.   IMPRESSION:  This is a 65 year old white male status post sigmoidectomy  due to diverticular disease.   ASSESSMENT:  1. Sigmoidectomy due to diverticular disease.  Appears to be doing      well post surgery at this time.  2. Mild anxiety.  He will continue with Ativan and nicotine patch.  3. Diabetes mellitus type 2.  Diet is advanced, and  will need to      manage with more routine long-acting insulin to maximize therapy.  4. History of ? psoas abscess.  Has not had any further fever.  Is      continued on Cipro for now.  No other signs.  5. Anemia.  Hemoglobin has remained stable post surgery, although on      the low side.  6. Left thigh pain.  The patient is worried about his bounce back from      a physical standpoint due to pain in his left leg.  Unclear exactly      the cause of this.  It has been going on for some time, and Dr.      Juanetta Gosling is aware of this.      Appears neuropathic in nature, and question whether related to      lumbar spine compression on nerve or lateral cutaneous nerve  compression, and whether the patient might benefit from being on a      medicine such as Neurontin or Lyrica is unclear.  Will defer to Dr.      Juanetta Gosling for this.      Catalina Pizza, M.D.  Electronically Signed     ZH/MEDQ  D:  03/16/2006  T:  03/18/2006  Job:  [id number]

## 2010-05-23 NOTE — Group Therapy Note (Signed)
[name], [name]            ACCOUNT NO.:  [id number]   MEDICAL RECORD NO.:  [id number]          PATIENT TYPE:  INP   LOCATION:  A314                          FACILITY:  [facility]   PHYSICIAN:  Edward L. Juanetta Gosling, M.D.DATE OF BIRTH:  22-Aug-1945   DATE OF PROCEDURE:  03/19/2006  DATE OF DISCHARGE:  03/19/2006                                 PROGRESS NOTE   [name] says he is doing okay and he is set for discharge by Dr.  Malvin Johns today.  I have discussed his medications with him.  He will be  on his regular medicines at home, but he will not be taking his meds for  lipids.  I have discussed with him the fact that he needs to be  rechecked sometime in probably 2-3 months as far as his lipids, A1c  level, et Karie Soda, that doing it now probably will not help [place].   His temperature is 99.8, pulse is 90, respirations 20, blood sugar 209,  blood pressure 161/86, O2 sats 97% on room air.  He looks better.   ASSESSMENT:  He is improving.   PLAN:  Discharge today per Dr. Malvin Johns.      Edward L. Juanetta Gosling, M.D.  Electronically Signed     ELH/MEDQ  D:  03/19/2006  T:  03/20/2006  Job:  [id number]

## 2010-05-23 NOTE — Consult Note (Signed)
[name], [name] NO.:  [id number]   MEDICAL RECORD NO.:  [id number]          PATIENT TYPE:  INP   LOCATION:  A214                          FACILITY:  [facility]   PHYSICIAN:  Barbaraann Barthel, M.D. DATE OF BIRTH:  [birth year]   DATE OF CONSULTATION:  11/16/2005  DATE OF DISCHARGE:                                   CONSULTATION   Surgery was asked to see this 65 year old white male for abdominal pain and  diverticular disease.   CHIEF COMPLAINT:  Left-sided abdominal crampy pain for approximately 1 week.   HISTORY OF PRESENT MEDICAL ILLNESS:  The patient states that he developed  abdominal pain approximately a week ago and this began in the left lower  quadrant with crampy pain and he is essentially ignored this.  He did take  some milk of magnesia when he was having symptoms of constipation.  The pain  worsened and necessitated a visit to the emergency room on Sunday and  surgery was consulted the following day.  CT scans revealed that he had  diverticulitis with some dilated proximal bowel and he had what appeared to  be abscess formation within the wall of the left colon and there was no free  air and no extravasation of contrast.   Of note is the fact that this patient has had a colonoscopy basically in  March 2006, because he bears the diagnosis of having ulcerative colitis of  the transverse colon.  This was diagnosed by Dr. Welton Flakes years ago.  This  colonoscopy otherwise did not reveal any cancer.  He also has had a history  of diverticulitis in two other previous occasions.   PHYSICAL EXAMINATION:  GENERAL APPEARANCE:  A pleasant 65 year old white  male.  VITAL SIGNS:  He is 6 feet 2, weighs 200 pounds.  He is afebrile,  temperature 98, blood pressure 117/65, heart rate 75 per minute,  respirations 18.  HEENT:  Head is normocephalic.  Eyes:  Extraocular movements are intact.  Pupils are round and react to light and accommodation.  There is noted some  conjunctive pallor.  Sclera is otherwise a normal tincture.  Nose and oral  mucosa are moist.  NECK:  Supple and cylindrical without jugular vein distension, thyromegaly  or tracheal deviation.  There is no cervical adenopathy and no bruits are  auscultated.  The patient has had all of his teeth extracted.  CHEST:  Clear both anterior and posterior to auscultation.  HEART:  Regular rhythm.  ABDOMEN:  Bowel sounds are normoactive.  The patient is somewhat tender on  deep palpation on the left side, but clearly improved from after admission  and the initiation of antibiotics.  He states that he is certainly better  than he was a week ago when this process began.  No femoral or inguinal  hernias are appreciated.  GENITALIA:  Otherwise within normal limits.  The patient has a right  paramedial incision consistent with a previous gallbladder surgery and he  states at that time he also had an incidental appendectomy.  RECTAL:  There is trace guaiac positive stool and no masses palpated  and the  prostate is smooth.  EXTREMITIES:  Within normal limits.   REVIEW OF SYSTEMS:  GI:  The patient has had a couple of bouts of nausea and  vomiting with this episode.  He has a history of hiatal hernia and a history  of diverticular disease in the past and he has been diagnosed with  ulcerative colitis in the past and he has undergone fairly regular follow-up  of with colonoscopies to follow-up after this diagnosis.   The patient has no history of hepatitis.  The patient he has had no bright  red rectal bleeding or black tarry stools.  He has had some constipation  and, as stated, he has guaiac-positive stools on rectal examination.   GU:  The patient states that he has had no dysuria present or frequency.  He  does have a small kidney stone noted on CT scan but he has had no  symptomatic kidney stone problems in the past.  ENDOCRINE:  The patient as a  history of diabetes type 2 for which he takes  glyburide.  No past history of  thyroid disease.   CARDIORESPIRATORY:  The patient has a history of hypertension and  hyperlipidemia.  The patient smokes, he states, half a pack of cigarettes  per day.  He also apparently imbibes bourbon on a fairly regular basis,  although he states that this is social in nature.   MUSCULOSKELETAL:  Grossly within normal limits.   SOCIOECONOMIC:  The patient works in the back office of a bank.   MEDICATIONS:  The patient takes glyburide, lisinopril and a Statin for his  hyperlipidemia.  He has some intolerance to p.o. Flagyl, no allergy per se.   IMPRESSION:  Recurrent diverticulitis with abscess formation as noted on the  CT scan.   SECONDARY DIAGNOSES:  1. Diabetes mellitus type 2.  2. Hypertension.  3. History of ulcerative colitis with a past colonoscopy being negative      for any cancer in March 2006.   PLAN:  This patient is improving with antibiotic therapy.  I have increased  his antibiotics from 400 mg a Cipro IV daily to b.i.d.  He is to continue  his Cleocin and I have altered his diet to a full liquid diet only and we  will follow him closely.  He has currently had his electrolytes replaced as  he presented with some hypokalemia that was significant and now after six  runs of potassium, his potassium is now 3.2 with a sodium 128.  This is due  to the diarrhea that was having from this problem.   The plan is to remain on full liquid diet, continue the antibiotics and  replacement of potassium and I will follow him for any acute changes.  He  appears to be improving.  We can follow him with a flat plate of the abdomen  to see if there are any changes despite saying that he may have some  obstructive qualities, he is moving his bowels well here now and passing  flatus and clinically he is not obstructing and clinically is abdomen is  soft with improvement.  We will follow him closely for any acute surgical  changes.     Barbaraann Barthel, M.D.  Electronically Signed     WB/MEDQ  D:  11/16/2005  T:  11/16/2005  Job:  [id number]

## 2010-05-23 NOTE — Group Therapy Note (Signed)
NAMEERNESTINE, [name]            ACCOUNT NO.:  [id number]   MEDICAL RECORD NO.:  [id number]          PATIENT TYPE:  INP   LOCATION:  IC09                          FACILITY:  [facility]   PHYSICIAN:  Edward L. Juanetta Gosling, M.D.DATE OF BIRTH:  02-23-1945   DATE OF PROCEDURE:  DATE OF DISCHARGE:                                 PROGRESS NOTE   This is a patient of Dr. Malvin Johns in the intensive care unit.   SUBJECTIVE:  [name] is overall doing, I think, about the same.  He has no new complaints.  He is intubated on a ventilator.   PHYSICAL EXAMINATION:  He is awake and alert.  His blood pressures in the 130s, pulse is in the 80s.  His chest is very clear.  His heart is regular.  He motions that his pain is fairly well controlled but not perfect.   ASSESSMENT:  He seems to doing better.   PLAN:  Try to continue with his meds and treatments, and see if we get  him off the ventilator today.      Edward L. Juanetta Gosling, M.D.  Electronically Signed     ELH/MEDQ  D:  03/11/2006  T:  03/11/2006  Job:  [id number]

## 2010-05-23 NOTE — Op Note (Signed)
[name] [name], [name]            ACCOUNT NO.:  [id number]   MEDICAL RECORD NO.:  [id number]          PATIENT TYPE:  AMB   LOCATION:  DAY                           FACILITY:  [facility]   PHYSICIAN:  Lionel December, M.D.    DATE OF BIRTH:  1945-12-04   DATE OF PROCEDURE:  03/27/2004  DATE OF DISCHARGE:                                 OPERATIVE REPORT   PROCEDURE:  Total colonoscopy with polypectomy.   INDICATIONS:  [name] [name] is a 65 year old Caucasian male with remote history of  distal ulcerative colitis, who is not on any maintenance therapy, who is  having a recurrent episode of LLQ abdominal pain.  Recently he had low-grade  fever and also had constipation while usually he has diarrhea.  He felt  better after he took Cipro, which was given to him for other reasons.  He  says his last colonoscopy was in 2000 at [facility],  which was normal.   The procedure risks were reviewed the patient and  informed consent was  obtained.   PREMEDICATION:  Demerol 50 mg IV, Versed 8 mg IV.   FINDINGS:  Procedure performed in endoscopy suite.  The patient's vital  signs and O2 saturation were monitored during procedure and remained stable.  The patient was in the placed left lateral recumbent position and rectal  examination performed.  No abnormality noted external or digital exam.  The  Olympus video scope was placed in the rectum and advanced under vision into  sigmoid colon.  Preparation was suboptimal.  He had thick mucosal coating of  stool and some pieces of formed stool.  He had scattered diverticula  throughout the colon.  There was a swollen, erythematous fold at sigmoid  colon in the vicinity of some diverticula, and at this segment was narrowed.  After some difficulty I was able to pass the scope through it  finally  through this segment, which appeared to be spastic.  The scope was advanced  into cecum.  He had scattered diverticula throughout the colon, but most of  these were in  the sigmoid colon.  Cecal landmarks, i.e. appendiceal stump  and ileocecal valve, were-well seen.  A short segment of TI was also  examined and was normal.  As the scope was withdrawn colonic mucosa was  carefully examined.  There two small polyps, one at descending colon,  another one at hepatic flexure, both of which was snared and retrieved for  histologic exam and submitted in one container.  There were two more small  polyps in area of splenic flexure-descending colon, which was snared and  retrieved for histologic examination.  There was least one more polyp in  transverse colon.  He had a lot of stool in this area, and this was not  snared.  There were no other mucosal abnormalities, although exam was not  optimal because of the prep.  On the way out, biopsy was taken from this  erythematous, swollen fold in the sigmoid colon.  Mucosa of the rectum was  normal.  Scope was retroflexed to examine anorectal region and moderate-  sized hemorrhoids were  noted below the dentate line.  Endoscope was  straightened and withdrawn.  The patient tolerated the procedure well.   FINAL DIAGNOSES:  1.  Pan colonic diverticulosis.  He has markedly swollen fold at sigmoid      colon with spastic segment, possibly indicative of diverticulitis.  2.  Four small polyps snared, two from the right side into from splenic      flexure area.  He had at least one or maybe two more polyps which were      not removed.  3.  External hemorrhoids.   RECOMMENDATIONS:  1.  Will treat him with Cipro 500 mg p.o. b.i.d. for two weeks, followed by      500 mg p.o. b.i.d. for two weeks along with metronidazole 500 mg p.o.      b.i.d. for weeks.  2.  He will start Fiber Choice two tablets q.d. starting next week.  3.  Dicyclomine 10 mg p.o. t.i.d..  4.  I will be contacting the patient with biopsy results.  We will plan to      seen him in the office in four to six weeks and determine whether or not      he should  have a barium enema otherwise.  If he remains with pain in      left lower quadrant, I may consider barium enema to make sure he has not      developed stricture in this area.  5.  Finally he should return for colonoscopy in six to 12 months after a      vigorous prep, at which time these polyps will be removed and biopsy      also taken for from to taken from different segments of the colon      looking for dysplasia.      NR/MEDQ  D:  03/27/2004  T:  03/27/2004  Job:  [id number]   cc:   Ramon Dredge L. Juanetta Gosling, M.D.  [address]  [place]  [place] [address]  Fax: [phone]

## 2010-05-23 NOTE — Group Therapy Note (Signed)
[name], [name]            ACCOUNT NO.:  [id number]   MEDICAL RECORD NO.:  [id number]          PATIENT TYPE:  INP   LOCATION:  A314                          FACILITY:  [facility]   PHYSICIAN:  Edward L. Juanetta Gosling, M.D.DATE OF BIRTH:  1945/07/29   DATE OF PROCEDURE:  03/17/2006  DATE OF DISCHARGE:                                 PROGRESS NOTE   [name] has had some abdominal discomfort during the night that he  thinks was gas but feels better now.  He is otherwise doing fairly well.  He did not eat this morning because he did not feel like it with the  abdominal discomfort.  Otherwise, That he will likely do with abdominal  discomfort.  Otherwise he is doing okay.  He has been up and moving  around, walking in the halls.  His exam shows that he is awake and  alert, looks fairly comfortable.  Temperature is 98.5; pulse is 90,  respirations 20, blood sugar 201, blood pressure 150/73.  His chest is  clear.  Abdomen is soft.   ASSESSMENT:  He seems to be doing better in general.   PLAN:  Continue his current medications and treatments, and I have added  [facility] standing orders so he could have Maalox, Mylanta, etc., if he  needs it.  The same.  The patient for the 13th [name] says he is  doing better.  RECTAL:  Plans to send him home tomorrow.  He is set for  a to have his sutures removed.  His exam temperature is 99, pulse 97,  respirations 20, blood sugar 201, blood pressure 132/74, O2 saturations  97% on room air.   ASSESSMENT:  He is had a colon resection.  He is better but he is still  slow slowly responding.  His blood sugars in the actually stressed that.      Edward L. Juanetta Gosling, M.D.  Electronically Signed     ELH/MEDQ  D:  03/18/2006  T:  03/19/2006  Job:  [id number]

## 2010-05-23 NOTE — Group Therapy Note (Signed)
[name], [name]            ACCOUNT NO.:  [id number]   MEDICAL RECORD NO.:  [id number]          PATIENT TYPE:  INP   LOCATION:  A319                          FACILITY:  [facility]   PHYSICIAN:  Edward L. Juanetta Gosling, M.D.DATE OF BIRTH:  [birth year]   DATE OF PROCEDURE:  11/23/2005  DATE OF DISCHARGE:                                   PROGRESS NOTE   [name] says he is much better.  He had a lot of gas last night but he  is otherwise feeling okay.  He has no new complaints.   PHYSICAL EXAM:  Now shows his temperature 100.1, pulse 78, respirations 20,  blood sugar 163, blood pressure 121/77.   I have discussed with the GI team and we are talking about going ahead and  getting him off of IV antibiotics.   LABS:  This morning show his white count is up to 6,200, hemoglobin is 13.3,  potassium 5.1, albumin is 2.3.   He remains on Cipro, Zosyn, and I am going to switch him over to Cipro and  Augmentin by mouth.  He may be able to be discharged tomorrow.      Edward L. Juanetta Gosling, M.D.  Electronically Signed     ELH/MEDQ  D:  11/23/2005  T:  11/23/2005  Job:  [id number]

## 2010-05-23 NOTE — Consult Note (Signed)
[name], [name] NO.:  [id number]   MEDICAL RECORD NO.:  [id number]          PATIENT TYPE:  INP   LOCATION:  A331                          FACILITY:  [facility]   PHYSICIAN:  [name], M.D. DATE OF BIRTH:  1945/02/14   DATE OF CONSULTATION:  03/09/2006  DATE OF DISCHARGE:                                 CONSULTATION   NOTE:  This is a 65 year old white male who is admitted to the hospital  for elective sigmoid resection.  He was referred both by the medical  service and the GI service for sigmoid resection for recurrent  diverticulitis.  He had had three previous episodes and a procedure  complicated with abscess.  We had treated him with antibiotics.  This  became quiescent and finally after improvement with serial CTs and we  had plan for elective surgery.  We discussed complications with him in  detail, discussing complications not limited to but including bleeding,  infection, anastomosis leak and the possibility of a colostomy.  Informed consent was obtained.   PHYSICAL EXAMINATION:  GENERAL:  A pleasant 64 year old white male who  appears tired.  VITAL SIGNS:  He is 6 feet 2 inches, weighs 182-1/2 pounds.  His  temperature is 98.5 and respirations 16, pulse rate 80, and a blood  pressure 106/60.  HEENT:  His head is normocephalic.  Eyes:  Extraocular movements are  intact.  Pupils were round and react to light and accommodation.  There  is some conjunctive pallor noted.  The sclerae otherwise are a normal  tincture without an icteric tincture.  Nose and oral mucosa are moist.  The patient has had extensive dental work.  NECK:  Supple and cylindrical without jugular vein distension,  thyromegaly or tracheal deviation.  There is no cervical adenopathy.  No  bruits are auscultated and no cervical adenopathy.  CHEST:  Clear both anterior and posterior to auscultation.  HEART:  Regular rhythm.  ABDOMEN:  The patient is mildly tender on deep palpation  in the left  lower quadrant, however bowel sounds are normoactive.  The patient has  had a colonoscopy in March of 2006 with the last rectal examination  performed in the office was and guaiac-negative.  GENITALIA:  Within  normal limits.  The patient has no femoral or inguinal hernias.  EXTREMITIES:  Within normal limits.   REVIEW OF SYSTEMS:  GI:  The patient has a history of recurrent  diverticular disease as mentioned above.  CT scan shows some  improvement.  He bears the diagnosis of ulcerative colitis, which  apparently was in the transverse colon, room seen by Dr. Welton Flakes many years  ago.  This was not mentioned with any flare-ups or any cancer findings  on his last colonoscopy March 2006.  The patient has no history of  hepatitis.  He has had no bright red rectal bleeding or black, tarry  stools.  Recently he has had some constipation and he is having loose  stools now and has just completed his mechanical bowel prep.  MUSCULOSKELETAL:  Grossly within normal limits.  CARDIORESPIRATORY:  The  patient has a  history of hypertension and hyperlipidemia.  He smokes  perhaps maybe a half pack of cigarettes per day and he has discontinued  his alcohol consumption, which he did on a fairly regular social basis.  ENDOCRINE:  The patient is a type 2 diabetic.  SOCIOECONOMIC:  The  patient works in the office in a bank.   MEDICATIONS:  The patient takes glyburide, lisinopril and a statin for  hyperlipidemia.   He has no known allergies.  However, Flagyl does make him nauseated.   IMPRESSION:  1. Recurrent diverticular disease with previous abscess formation and      previous 3 hospitalizations, 3 serious bouts with diverticular      disease.  2. Diabetes mellitus type 2.  3. Hypertension.  4. Hyperlipidemia.  5. Remote history of ulcerative colitis, negative colonoscopy.   PLAN:  The we have discussed admission with him.  He is admitted to Dr.  Juanetta Gosling' service.  We will follow him in  consultation and we will plan  for sigmoid resection.  We have discussed this in detail with the  patient including the above-mentioned possibility of risks, and he is  complaining his mechanical and antibiotic prep and when medically  stable, we will plan for surgery on him in the morning.  Laboratory data  is in order.  He has some mild hypokalemia of 3.4.  We will give him  some p.o. potassium preoperatively.  We will plan for surgery if all  agree in the a.m.      [name], M.D.  Electronically Signed     WB/MEDQ  D:  03/09/2006  T:  03/10/2006  Job:  [id number]   cc:   Ramon Dredge L. Juanetta Gosling, M.D.  Fax: [phone]   Lionel December, M.D.  [address]  [place]  [place] [address]

## 2010-05-23 NOTE — Group Therapy Note (Signed)
[name], [name]            ACCOUNT NO.:  [id number]   MEDICAL RECORD NO.:  [id number]          PATIENT TYPE:  INP   LOCATION:  A319                          FACILITY:  [facility]   PHYSICIAN:  Edward L. Juanetta Gosling, M.D.DATE OF BIRTH:  08/17/1945   DATE OF PROCEDURE:  11/24/2005  DATE OF DISCHARGE:                                   PROGRESS NOTE   PROBLEMS:  1. Diverticulitis.  2. Diabetes.  3. Hypertension.   Mr. Basista says he is better.  He has no new complaints.  He has not had  any nausea or vomiting.  His exam shows temperature is 97.3, pulse 77,  respirations 20, blood sugar 119, blood pressure 125/75, O2 sats 100% on  room air.  His chest is clear.  His abdomen is soft.  CT done showed  improvement.   ASSESSMENT:  He is improved.  Plan is for discharge home.  Please see  discharge summary for details.      Edward L. Juanetta Gosling, M.D.  Electronically Signed     ELH/MEDQ  D:  11/24/2005  T:  11/24/2005  Job:  [id number]

## 2010-05-23 NOTE — Group Therapy Note (Signed)
NAMEMAKAYLA, CONFER            ACCOUNT NO.:  [id number]   MEDICAL RECORD NO.:  [id number]          PATIENT TYPE:  INP   LOCATION:  A319                          FACILITY:  [facility]   PHYSICIAN:  Edward L. Juanetta Gosling, M.D.DATE OF BIRTH:  29-Jul-1945   DATE OF PROCEDURE:  11/21/2005  DATE OF DISCHARGE:                                   PROGRESS NOTE   PROBLEM:  1. Diverticulitis with probable abscess formation.  2. Diabetes.  3. Hypertension.   SUBJECTIVE:  Mr Hardcastle says he is better and feels a little better.  He  is eating a little bit but has still not had a bowel movement.  His abdomen  is still mildly uncomfortable.   His physical exam this morning shows that his temperature is 97.6, pulse 68,  respirations 20, blood sugar 169, blood pressure 135/70. White count is  3500, hemoglobin 13, platelets 263. His abdomen is soft.  His chest is  clear.   ASSESSMENT:  He has diverticulitis which is better but not gone.  He has low  white blood cell count, I think multifactorial. He has diabetes, which is  well-controlled, and hypertension, well controlled. He has problems with use  of alcohol, but he has not had any symptoms of DTs thus far.  I am going to  try to get him up and moving around some more and hopefully have him ready  for discharge soon.      Edward L. Juanetta Gosling, M.D.  Electronically Signed     ELH/MEDQ  D:  11/21/2005  T:  11/21/2005  Job:  [id number]

## 2010-05-23 NOTE — Op Note (Signed)
NAME:  [name], [name] NO.:  [id number]   MEDICAL RECORD NO.:  [id number]          PATIENT TYPE:  INP   LOCATION:  A331                          FACILITY:  [facility]   PHYSICIAN:  Barbaraann Barthel, M.D. DATE OF BIRTH:  Oct 04, 1945   DATE OF PROCEDURE:  03/10/2006  DATE OF DISCHARGE:                               OPERATIVE REPORT   SURGEON:  Barbaraann Barthel, M.D.   ASSISTANT:  Dalia Heading, M.D.   PREOPERATIVE DIAGNOSIS:  Recurrent diverticular disease.   POSTOPERATIVE DIAGNOSIS:  Recurrent diverticular disease.   PROCEDURES:  1. Sigmoid resection with stapled side-to-side anastomosis.  2. Drainage of small left psoas muscle abscess.   NOTE:  This is a 65 year old white male who had recurrent episodes of  diverticular disease.  He was referred by the medical service and the GI  service for resection.  We tried to have him in a quiescent state prior  to surgery and he improved clinically this was noted on serial CT scans,  which showed resolution of very small psoas muscle abscess, and the  patient clinically improved and we then scheduled him for surgery and an  elective basis.  The week prior he thought he was beginning to have a  cold and we cancelled the surgery and then rescheduled him, at which  time he was admitted to the hospital, where we then discussed surgery  with this patient and on an elective basis, as stated, and discussing  complications not limited to but including bleeding, infection,  anastomotic leak and the possibility that colostomy may be required.  Informed consent was obtained.   GROSS OPERATIVE FINDINGS:  The patient had an inflamed mid sigmoid colon  that was adhesed to the retroperitoneum.  This was difficult and  required considerable dissection to remove this and freeing up of the  splenic flexure and elevating the distal sigmoid colon and rectum to  allow an anastomosis.  He also had a small psoas muscle abscess.  Rest  of  examination was within normal limits.  We did not see any other  lesions.  There were no changes clinically to substantiate the previous  diagnosis of Dr. Welton Flakes years ago that he may have localize transverse  colon ulcerative colitis.  There were certainly no findings there.  The  rest of the examination apart from extensive adhesions in the right  upper quadrant from his previous open gallbladder surgery, there were no  other abnormalities.  He did have a previous appendectomy.   TECHNIQUE:  The patient was placed in a supine position and after the  adequate administration of general anesthesia via endotracheal  intubation, a Foley catheter was aseptically inserted and the abdomen  was prepped with Betadine solution and draped in the usual manner.  The  patient was placed in stirrups.  We then did a midline incision, which  we had to extend almost to the xiphoid process to allow [place] to do this  difficult dissection with this frozen sigmoid colon into his pelvis.  We  freed up the colon distally and then amputated it with a GIA stapling  device and  then proximally we used a TA-55 stapler to double-close the  proximal portion.  We had to do this again as there was a small amount  of leak when we did that.  We then with tedious dissection clamped the  mesocolon and ligated this with 2-0 silk and with smaller vessels using  the LDS as needed.  Once the specimen was removed, we then irrigated  copiously with normal saline solution, changed gloves, and then  performed a side-to-side stapled anastomosis using then the GIA and the  TA-55 stapler to close the anastomosis.  A distal 3-0 GI silk suture was  placed distally.  We then approximated the mesentery.  I elected leave a  Jackson-Pratt drain in the area of the small psoas muscle wall abscess  which we encountered.  This was quite small.  We irrigated copiously  when we finished the anastomosis and changed gloves and checked for  hemostasis.   We then approximated the peritoneum with 0 Polysorb suture  and the fascia with interrupted 0 Prolene sutures.  The skin was  approximated with the stapling device.   It should be stated that there was some difficulty placing the NG tube  towards the end of the procedure.  We thought that Dr. Lovell Sheehan may have  to do an upper GI endoscopy in order to place this.  This was not  necessary and he was able to manipulate a 16-French NG tube into to the  stomach and I was able to manipulate it further distally so that we had  it in good position at the time of closure.  Prior to closure all  sponge, needle and instrument counts were found to be correct.  Estimated blood loss was approximately 1000 mL.  This was due to no  uncontrolled bleeding but just constant draining or oozing from the  difficult dissection.  The patient received 2 units of packed red blood  cells, 500 mL of Hespan, and 5000 mL of crystalloids intraoperatively.  The patient was then taken to the recovery room.  Once in the recovery  room the patient was extubated but required reintubation when he had  difficulty breathing.  He was taken to the intensive care unit with the  endotracheal tube in place.  I called Dr. Juanetta Gosling, notifying him of his  need for a respiratory consultation and to follow medically.  This  patient will be followed in the intensive care unit postoperatively.      Barbaraann Barthel, M.D.  Electronically Signed     WB/MEDQ  D:  03/10/2006  T:  03/10/2006  Job:  [id number]   cc:   Ramon Dredge L. Juanetta Gosling, M.D.  Fax: [phone]   Lionel December, M.D.  [address]  [place]  [place] [address]   Dalia Heading, M.D.  Fax: [phone]

## 2010-05-23 NOTE — Consult Note (Signed)
[name], [name]            ACCOUNT NO.:  [id number]   MEDICAL RECORD NO.:  [id number]          PATIENT TYPE:  INP   LOCATION:  IC09                          FACILITY:  [facility]   PHYSICIAN:  Roselie Awkward, MD    DATE OF BIRTH:  12-22-1945   DATE OF CONSULTATION:  DATE OF DISCHARGE:                                 CONSULTATION   ANESTHESIA CONSULTATION NOTE   PROCEDURE:  Insertion of light internal jugular central venous line.   DIAGNOSIS:  Status post colon resection and need for venous access.   Consultation was received by Dr. Malvin Johns to insert right internal  jugular central line for venous access for this patient.  The procedure  was explained to patient in detail and risks and complications were  discussed and patient consent was obtained.  Patient was placed supine  in bed and the right area of the neck was exposed and prepped with  antiseptic.  Sterile drape, gown, gloves and technique were observed  throughout the procedure.  With the patient in Trendelenburg position  local anesthesia was used to infiltrate the skin.  A 22 gauge needle was  used to locate the internal jugular vein at the medial border of the  lateral head of the sternocleidomastoid muscle and a sheath introducer  was then inserted into the internal jugular through which a guidewire  was passed.  A vessel dilator was then passed over the guidewire, then  an Arrow triple lumen catheter was passed over the guidewire into the  internal jugular vein and advanced to a depth of about 18 cm.  The  guidewire was removed and blood was aspirated from the distal port.  The  port was flushed and the catheter was sutured in place and secured and  all ports were then aspirated and flushed with saline.  The patient was  taken out of Trendelenburg position and the neck area cleansed and  sterile dressing applied.  The patient tolerated the procedure well  without complications and portable chest x-ray was ordered  for  confirmation of placement.           ______________________________  Roselie Awkward, MD     CP/MEDQ  D:  03/12/2006  T:  03/12/2006  Job:  [id number]

## 2010-05-23 NOTE — Group Therapy Note (Signed)
NAME:  CARL, BUTNER NO.:  [id number]   MEDICAL RECORD NO.:  [id number]          PATIENT TYPE:  INP   LOCATION:  IC09                          FACILITY:  [facility]   PHYSICIAN:  Catalina Pizza, M.D.        DATE OF BIRTH:  1945/11/11   DATE OF PROCEDURE:  03/15/2006  DATE OF DISCHARGE:                                 PROGRESS NOTE   Mr. Poyer is a pleasant white male postoperative day #5 from sigmoid  resection secondary to diverticular disease followed by Barbaraann Barthel, M.D.  Apparently he is doing well at this time.  He did state  he passed gas and does not feel as anxious and jittery since started on  the nicotine patch.  No other issues at this time.   PHYSICAL EXAMINATION:  VITAL SIGNS:  The patient is afebrile, blood  pressures have been ranging in the normal to high normal range.  Heart  rate is stable saturating 99% on room air.  GENERAL:  This is a white male lying in bed in no acute distress.  HEENT:  Unremarkable.  LUNGS:  Few crackles at the bases bilaterally, but otherwise good air  movement.  HEART:  Regular rate and rhythm with no murmurs, rubs, or gallops.  ABDOMEN:  Soft with generalized diffuse tenderness related to surgery.  Continue with drain in the midline, serosanguineous fluid coming out of  this.  EXTREMITIES:  No lower extremity edema.  NEUROLOGY:  Alert and oriented x3.  No further confusion.   LABORATORY DATA:  BMET shows sodium 131, potassium 3.8, chloride 101,  CO2 27, glucose 117, BUN 1, creatinine 1.03, calcium 7.4.  CBC; white  count 7.8, hemoglobin 10, platelet count 267.  CBG's have been 163, 173,  176, 168, and 148, respectively.   IMPRESSION:  This is a 65 year old white male status post sigmoidectomy  due to diverticular disease, and a history of small abscess in the psoas  area.   ASSESSMENT:  1. Recent abdominal surgery followed by Dr. Malvin Johns for this.  2. Recent effusion but no further signs of effusion at this  time.  3. Mild anxiety.  Continue with the Ativan and Nicotine patch p.r.n.  4. Sore throat, probably related to NG tube.  Continue with      Chloraseptic spray.  5. Diabetes mellitus type 2.  As the patient begins to transition to      oral food per Dr. Daisy Blossom orders, we will transition over to      long-acting insulin as well as continue with a sliding scale for      now.  6. Question psoas abscess.  He did have another low grade temperature      at 100.3.  No significant white count at this time, but continued      to evaluate and continue with the Cipro.  Question whether we need      to broaden spectrum antibiotics if continues to have fever or      whether Dr. Malvin Johns thinks this is more related to his post      surgery fever.  7. Anemia.  His hemoglobin has remained stable since surgery and has      trended up mildly.  Once the patient is eating and drinking more,      should improve.      Catalina Pizza, M.D.  Electronically Signed     ZH/MEDQ  D:  03/15/2006  T:  03/15/2006  Job:  [id number]

## 2010-05-23 NOTE — Group Therapy Note (Signed)
[name], [name]            ACCOUNT NO.:  [id number]   MEDICAL RECORD NO.:  [id number]          PATIENT TYPE:  INP   LOCATION:  A319                          FACILITY:  [facility]   PHYSICIAN:  Edward L. Juanetta Gosling, M.D.DATE OF BIRTH:  Nov 20, 1945   DATE OF PROCEDURE:  11/22/2005  DATE OF DISCHARGE:                                   PROGRESS NOTE   PROBLEM:  Diverticulitis, diabetes, hypertension.   [name] is overall better.  He has been able to eat and keep things  down and seems to be improving.  He has no new complaints.   PHYSICAL EXAMINATION:  VITAL SIGNS: His exam today shows temperature is  97.5, pulse is 76, respirations 18, blood sugar 131, blood pressure 134/91,  O2 sat 100% on room air.  CHEST:  His chest is clear.  ABDOMEN:  His abdomen much softer.  EXTREMITIES:  Extremities showed no edema.   ASSESSMENT:  He is better.   PLAN:  Plan is to continue his treatments, try to continue to encourage him  to get up and move around.  Dr. Malvin Johns has advanced his diet.      Edward L. Juanetta Gosling, M.D.  Electronically Signed     ELH/MEDQ  D:  11/22/2005  T:  11/22/2005  Job:  [id number]

## 2010-05-23 NOTE — Group Therapy Note (Signed)
NAMEKERIM, STATZER            ACCOUNT NO.:  [id number]   MEDICAL RECORD NO.:  [id number]          PATIENT TYPE:  INP   LOCATION:  A214                          FACILITY:  [facility]   PHYSICIAN:  Edward L. Juanetta Gosling, M.D.DATE OF BIRTH:  1945/01/25   DATE OF PROCEDURE:  DATE OF DISCHARGE:                                   PROGRESS NOTE   PROGRESS NOTE:  Mr. Bonczek says he feels a little better today.  He does  not have any other new complaints.  He is still having some abdominal pain.  He has had a small bowel movement.   PHYSICAL EXAMINATION:  CHEST:  Fairly clear.  VITAL SIGNS:  He is afebrile now.  ABDOMEN:  Mildly tender.  EXTREMITIES:  Showed no edema.  CNS:  Grossly intact.   ASSESSMENT:  1. He has diabetes.  His blood sugars have been pretty good.  2. He has diverticulitis which is of course why he is here.  3. He has hypertension, well controlled right now.   PLAN:  Then is to have him continue his medications, continue with  treatments, and the antibiotics.  No changes in meds right now.      Edward L. Juanetta Gosling, M.D.  Electronically Signed     ELH/MEDQ  D:  11/17/2005  T:  11/17/2005  Job:  [id number]

## 2010-05-23 NOTE — Consult Note (Signed)
[name], [name]            ACCOUNT NO.:  [id number]   MEDICAL RECORD NO.:  [id number]          PATIENT TYPE:  INP   LOCATION:  A214                          FACILITY:  [facility]   PHYSICIAN:  Lionel December, M.D.    DATE OF BIRTH:  1945/10/24   DATE OF CONSULTATION:  DATE OF DISCHARGE:                                   CONSULTATION   REASON FOR CONSULTATION:  Diverticulitis/abdominal pain.   HISTORY OF PRESENT ILLNESS:  [name] is a 65 year old Caucasian male  who states that over the last week and a half he has felt severe lower  abdominal pain.  Pain began to increase with movement.  He rated his pain  anywhere from 4-10/10 on pain scale.  He describes the pain as cramping.  He had constipation prior to the onset of this where he took Milk of  Magnesia at home with minimal relief.  He had gone up to 5-6 days without  bowel movement.  He drank a bottle of magnesium citrate.  He has had  diarrhea ever since.  He says it has been too numerous to count episodes.  He complains of anorexia as well as intermittent nausea or vomiting and some  chills, but denies any fever.  He was found to be hemoccult positive in the  emergency room.  His last previous bout of diverticulitis was March 2006  where he underwent colonoscopy with Dr.  Karilyn Cota at that time.  He was found  to have pan colonic diverticulosis with possibly diverticulitis and was  treated with antibiotics at that time.  He had four small polyps which were  focally adenomatous.  He was found to have external hemorrhoids.  He also  told me he has a remote history of distal __________, but has not been on  medication for this.  He has a history of diabetes mellitus and  hypercholesterolemia, IBS and hypertension.  He is status post  cholecystectomy.   MEDICATIONS PRIOR TO ADMISSION:  1. Glyburide 2.5 mg daily.  2. Lisinopril __________mg daily.  3. Prevacid 40 mg daily.   ALLERGIES:  No known drug allergies.   INTOLERANCES:  FLAGYL CAUSES NAUSEA AND VOMITING.   FAMILY HISTORY:  Positive for colon cancer in a maternal uncle.  Denies any  first degree relatives with history of rectal carcinoma, liver or chronic GI  problems.   SOCIAL HISTORY:  He is single.  He does not have any children.  He has a 40-  pack year history of tobacco use.  He consumes a couple of alcoholic  beverages every other day.  Denies any drug use.   REVIEW OF SYSTEMS:  CONSTITUTIONAL:  Weight stable.  CARDIOVASCULAR:  Denies  any chest pain or palpitations. PULMONOLOGY:  Denies any cough, shortness of  breath, dyspnea or hemoptysis.  GI:  See HPI.  Denies any heartburn,  indigestion, dysphagia or odynophagia.  GU:  She denies any dysuria,  hematuria or increased urinary frequency.   PHYSICAL EXAMINATION:  VITAL SIGNS:  Weight 191.6 pounds, temperature 98,  respirations 18, blood pressure 117/65, pulse 75.  GENERAL APPEARANCE:  [name]  is alert, oriented x3.  SKIN:  Pink, warm and dry without any rashes or jaundice.  HEENT:  Sclerae are clear, nonicteric.  Conjunctivae are pink.  Oropharynx  pink and moist without any lesions.  NECK:  Supple without any mass or thyromegaly.  CHEST:  Heart regular rate and rhythm with normal S1, S2 without any  murmurs, clicks, rubs or gallops.  LUNGS:  Clear to auscultation bilaterally.  ABDOMEN:  Positive bowel sounds x4.  He does have increased tympany as well  as hyperactive bowel sounds.  Abdomen is soft and mildly distended.  He does  have tenderness to the left lower quadrant.  No rebound tenderness or  guarding.  No hepatosplenomegaly or mass.  EXTREMITIES:  Without clubbing or edema bilaterally.   LABORATORY DATA:  White blood cell count 5.6, hemoglobin 11.7, hematocrit  32.6, platelets 132, MCV 100.  Calcium 6.9, sodium 128, potassium 3.2,  chloride 96, CO2 26, BUN 15, creatinine 1.2, glucose 107, total bilirubin  1.7, alk-phos 98, AST 31, ALT 17, total protein 6,  albumin 2.3, lipase 18.  Myoglobin was greater than 500, troponin was normal as was CK MB.   IMPRESSION:  Mr. Kunath is a 65 year old Caucasian male with acute  diverticulitis with abscess and possible distal colonic obstruction.  Will  review CT with radiologist and Dr. Karilyn Cota.  Will need antibiotic coverage.  He is intolerant of Flagyl.   PLAN:  1. Review CT.  Agree with IV antibiotics for now.  2. Agree with surgical consult.  3. Further recommendations to follow pending review of CT.   Would like to thank Dr. Juanetta Gosling for allowing [place] to participate in the care  of [name].      Nicholas Lose, N.P.      Lionel December, M.D.  Electronically Signed    KC/MEDQ  D:  11/16/2005  T:  11/16/2005  Job:  [id number]   cc:   Lionel December, M.D.  [address]  [place]  [place] [address]

## 2010-05-23 NOTE — Discharge Summary (Signed)
[name], [name] NO.:  [id number]   MEDICAL RECORD NO.:  [id number]          PATIENT TYPE:  INP   LOCATION:  A314                          FACILITY:  [facility]   PHYSICIAN:  Barbaraann Barthel, M.D. DATE OF BIRTH:  Sep 26, 1945   DATE OF ADMISSION:  03/09/2006  DATE OF DISCHARGE:  03/14/2008LH                               DISCHARGE SUMMARY   PROCEDURES:  On March 10, 2006, sigmoid resection and drainage of psoas  muscle abscess.   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   SECONDARY DIAGNOSES:  1. Hypertension.  2. Hyperlipidemia.  3. Remote history of ulcerative colitis of the transverse colon.   CONSULTATIONS:  To Dr. Malvin Johns from surgery.   NOTE:  This is Dr. Malvin Johns dictating a Discharge Summary for Dr.  Juanetta Gosling.   HISTORY OF PRESENT ILLNESS:  This is a 65 year old white male who was  admitted to the medical service for recurrent diverticular disease and  for sigmoid resection.  He had been seen and scoped by the GI service  and was referred as well from them for a sigmoid resection.  He had  multiple bouts of this problem and was noted to have small abscesses  which were improving as he was clinically improving by serial CT scan  surveillance.  He was admitted on March 09, 2006. Mechanical antibiotic  prep was completed, and he was taken to surgery on March 10, 2006, after  being cleared by the medical service.  He underwent a stapled side-to-  side anastomosis of his sigmoid colon, and he had drainage of a psoas  muscle abscess.  This surgery went well.  He did require reintubation in  the recovery room due to poor respiratory effort on his part.  However,  he was later extubated without problems in the intensive care unit later  by the medical service.   The patient was extubated on March 11, 2006, and he remained stable  postoperatively.  His counts remained stable.  He did receive 2 units of  packed red blood cells  intraoperatively; however, he did not require any  postoperative transfusions.  His recovery was slow in the sense that he  had a mildly prolonged ileus; however, he did well, and his diet and  activity were advanced as tolerated, and he was transferred to the  regular floor on March 15, 2006.  His diet and activity were advanced  accordingly, and he was discharged on March 19, 2006, at which time he  was tolerating p.o. well, a soft diet, having bowel movements without  problem.  His wound was clean. Half of his clips were removed, and the  rest of his wound was Steri-Stripped. His drain had been removed a few  days prior to discharge.  He has remained afebrile without leg pain or  shortness of breath or any febrile episodes.   DISCHARGE INSTRUCTIONS:  The patient chooses to go to convalesce with a  friend as he lives by himself.  He will be convalescing in [place].  This patient has been given my number and knows how to call me should he  have any problems.   His discharge instructions include the fact that he is excused from  work.  He is told to increase his activity as tolerated.  He is  permitted to go up and down the stairs.  He is permitted to shower on  March 20, 2006, and he is told to clean his wound 2-3 times a day and  after a shower.  He is told to do no lifting greater than 5 pounds, no  driving, no sexual activity.  His diet is explained above.  He is told  to take a multivitamin daily. Nicoderm patch will be continued 14 mg  daily. Glyburide is to continue  2.5 mg daily, and pravastatin 40 mg  daily.  He is also given a prescription for Darvocet-N 100 one tablet  every 4-6 hours as needed for pain.   We will follow up with him in the perioperative period, and he is told  to contact us should there be any acute changes or go to the emergency  room.      Barbaraann Barthel, M.D.  Electronically Signed     WB/MEDQ  D:  03/19/2006  T:  03/20/2006  Job:  [id number]    cc:   Ramon Dredge L. Juanetta Gosling, M.D.  Fax: [phone]   Lionel December, M.D.  [address]  [place]  [place] [address]
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
## 2010-05-23 NOTE — Group Therapy Note (Signed)
[name], [name] NO.:  [id number]   MEDICAL RECORD NO.:  [id number]          PATIENT TYPE:  INP   LOCATION:  IC09                          FACILITY:  [facility]   PHYSICIAN:  Catalina Pizza, M.D.        DATE OF BIRTH:  July 29, 1945   DATE OF PROCEDURE:  03/14/2006  DATE OF DISCHARGE:                                 PROGRESS NOTE   [name] is a white male, postop day 4 from sigmoid resection  secondary to diverticular disease performed by Dr. Malvin [name].  Apparently  is doing well following this with some to-be-expected abdominal pain,  but the patient states he still feels a little jittery and is requesting  a nicotine patch.  He states he smokes approximately 1/2 to 1 pack per  day.  He also states he is having some throat pain which is likely from  NG tube in place.  Denies any other problems as far as shortness of  breath or chest pain.   PHYSICAL EXAMINATION:  VITAL SIGNS:  Temperature 99.9.  Blood pressure  has been ranging between 138/79, up to 157/73.  Heart rate is 85.  Respirations 19-20.  Satting 97% on room air.  GENERAL:  This is a white male lying in bed.  No acute distress.  HEENT:  Unremarkable.  RIGHT IJ SITE:  Looks good.  NG tube in place.  LUNGS:  Show a few crackles at the base bilaterally, but otherwise good  air movement.  HEART:  Regular rate and rhythm.  No murmurs, gallops, or rubs.  ABDOMEN:  Soft.  Does have some diffuse-type tenderness related to  recent surgery, mainly midline.  Does not show any significant drainage  and does have drain in place draining serosanguineous fluid.  EXTREMITIES:  No lower extremity edema.  NEUROLOGIC:  The patient is alert and oriented x3.  Does not have any  confusion at this time.   LABORATORY DATA:  None obtained.   IMPRESSION:  This is a 65 year old white male status post sigmoidectomy  due to diverticular disease and small abscess and psoas area.   ASSESSMENT AND PLAN:  1. Recent abdominal  surgery followed by Dr. Malvin [name] and will be      dealing with postsurgical stuff as well as pain and when the      patient is able to start getting routine feedings.  2. Recent effusion.  No further signs of effusion at this time.  3. Mild anxiety.  This may be related to withdrawal from tobacco.      Does have Ativan p.r.n. if needed.  Will start back on the nicotine      patch at this time.  4. Sore throat, this is likely due to NG tube.  We will use      Chloraseptic spray p.r.n. for this.  5. Diabetes mellitus, type 2.  Continue to monitor his blood sugars      which appear to be under better control at this time.  Will      continue with the sliding scale of insulin.  6. Psoas abscesses on Cipro IV.  Does have low-grade temperature at      99.9.  Question whether this is postsurgical versus any further      problems with abscess, but will continue with the antibiotic and      continue to monitor routine lab work.      Catalina Pizza, M.D.  Electronically Signed     ZH/MEDQ  D:  03/14/2006  T:  03/14/2006  Job:  [id number]
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
## 2010-05-23 NOTE — Group Therapy Note (Signed)
NAMEALLARD, LIGHTSEY            ACCOUNT NO.:  [id number]   MEDICAL RECORD NO.:  [id number]          PATIENT TYPE:  INP   LOCATION:  A314                          FACILITY:  [facility]   PHYSICIAN:  Edward L. Juanetta Gosling, M.D.DATE OF BIRTH:  1945-07-18   DATE OF PROCEDURE:  03/17/2006  DATE OF DISCHARGE:                                 PROGRESS NOTE   Mr. Grattan says he doing better, says that Dr. Malvin Johns plans to send  him home tomorrow.  He is set for having his sutures removed.  His exam,  temperature is 99, pulse 97, respirations 20, blood sugar 201, blood  pressure 132/74, O2 saturations 97% on room air.   ASSESSMENT:  He has had a colon resection.  He is better, but he is  still slowly responding.  His blood sugars we will need to address.   PLAN:  Continue his current medications and treatments, and I have added  [facility] standing orders so he could have Maalox, Mylanta, etc., if he  needs it.  The same.  The patient for the 13th Mr. Douville says he is  doing better.  RECTAL:  Plans to send him home tomorrow.  He is set for  a to have his sutures removed.  His exam temperature is 99, pulse 97,  respirations 20, blood sugar 201, blood pressure 132/74, O2 saturations  97% on room air.   ASSESSMENT:  He is had a colon resection.  He is better but he is still  slow slowly responding.  His blood sugars in the actually stressed that.      Edward L. Juanetta Gosling, M.D.  Electronically Signed     ELH/MEDQ  D:  03/18/2006  T:  03/19/2006  Job:  [id number]

## 2010-05-23 NOTE — Group Therapy Note (Signed)
[name], [name]            ACCOUNT NO.:  [id number]   MEDICAL RECORD NO.:  [id number]          PATIENT TYPE:  INP   LOCATION:  A214                          FACILITY:  [facility]   PHYSICIAN:  [name], M.D.DATE OF BIRTH:  12-31-1945   DATE OF PROCEDURE:  DATE OF DISCHARGE:                                   PROGRESS NOTE   PROBLEM:  Diverticulitis.   SUBJECTIVE:  [name] is overall much better.  He has less pain.  He  feels like eating.  He is eating some oatmeal now.  His chest is clear.  His  heart is regular.  Abdomen is fairly soft.  Temperature is 97.9, pulse 75,  respirations 20, blood sugar 129, blood pressure 131/72.   ASSESSMENT:  He is complaining of some back pain.  He is complaining of  insomnia.  My assessment is that he is better.  His white count is still low  at about 3000 today.  He has an HIV test pending.  Hemoglobin 11.4,  platelets 179, better.   ASSESSMENT:  He is better.   PLAN:  Is to continue his current treatments, medications and follow.      [name], M.D.  Electronically Signed     ELH/MEDQ  D:  11/19/2005  T:  11/19/2005  Job:  [id number]

## 2010-05-23 NOTE — Group Therapy Note (Signed)
[name], [name]            ACCOUNT NO.:  [id number]   MEDICAL RECORD NO.:  [id number]          PATIENT TYPE:  INP   LOCATION:  A319                          FACILITY:  [facility]   PHYSICIAN:  Edward L. Juanetta Gosling, M.D.DATE OF BIRTH:  1945/02/15   DATE OF PROCEDURE:  11/20/2005  DATE OF DISCHARGE:                                   PROGRESS NOTE   Mr. Schoenfelder says he is much better.  He feels like eating.  He is very  weak, in his upper extremities mostly, however.  He has no other new  complaints.  His physical examination this morning shows temperature is 99,  pulse 88, respirations 20, blood sugar 136, blood pressure 143/77.  His  chest is fairly clear.  His heart is regular.  His abdomen much softer.  Extremities showed he does have some, what looks like even some muscle  wasting.  I suspect his nutrition has been bad.  He has not been following  his diet at home, has not been exercising.  His HIV screen was normal.   ASSESSMENT:  He is better, still diabetic of course.  He has hypertension,  which is pretty well-controlled.  He is very weak, so I am going to go ahead  and request physical therapy consultation to see if we get is a strength  better in his upper extremities.  We are going to go ahead and advance his  diet, discontinue his IV fluids and switch him over to a saline lock.      Edward L. Juanetta Gosling, M.D.  Electronically Signed     ELH/MEDQ  D:  11/20/2005  T:  11/20/2005  Job:  [id number]

## 2010-05-23 NOTE — Procedures (Signed)
NAMEJAFET, WISSING            ACCOUNT NO.:  [id number]   MEDICAL RECORD NO.:  [id number]          PATIENT TYPE:  INP   LOCATION:  A314                          FACILITY:  [facility]   PHYSICIAN:  Edward L. Juanetta Gosling, M.D.DATE OF BIRTH:  04/08/1945   DATE OF PROCEDURE:  03/10/2006  DATE OF DISCHARGE:  03/19/2006                              EKG INTERPRETATION   RHYTHM STRIP:   INTERPRETATION:  The rhythm is sinus rhythm with considerable artifact.  There are areas that show what looks like an artifact from pulling on  the leads.      Edward L. Juanetta Gosling, M.D.  Electronically Signed     ELH/MEDQ  D:  04/09/2006  T:  04/09/2006  Job:  [id number]

## 2010-10-14 ENCOUNTER — Encounter (INDEPENDENT_AMBULATORY_CARE_PROVIDER_SITE_OTHER): Payer: Self-pay | Admitting: Internal Medicine

## 2010-10-14 ENCOUNTER — Ambulatory Visit (INDEPENDENT_AMBULATORY_CARE_PROVIDER_SITE_OTHER): Payer: Medicare Other | Admitting: Internal Medicine

## 2010-10-14 VITALS — BP 156/76 | HR 66 | Temp 98.5°F | Ht 74.0 in | Wt 198.0 lb

## 2010-10-14 DIAGNOSIS — R197 Diarrhea, unspecified: Secondary | ICD-10-CM

## 2010-10-14 MED ORDER — DIPHENOXYLATE-ATROPINE 2.5-0.025 MG PO TABS: 1.0000 | ORAL_TABLET | Freq: Four times a day (QID) | ORAL | Status: DC | PRN

## 2010-10-14 NOTE — Patient Instructions (Signed)
Fiber pills. Lomotil prn.  He will f/u in 4 monts.

## 2010-10-14 NOTE — Progress Notes (Signed)
Subjective:     Patient ID: [name], male   DOB: 05/09/1945, 65 y.o.   MRN: [id number]  HPI Mr. Advincula is a 65 yr old male presenting today with c/o    Years ago, he was having problems with diarrhea. He says it is getting worse and worse. He tells me has had diarrhea for 2-3 yrs which has progressively worsened.  Years ago, he was given an Rx for Cholestyramine for diarrhea.  He says it did not work.  Hx of colon cancer: 2 uncles and died with the colon cancer.   He c/o of explosive diarrhea which is not new.    He says he will have the urge to go and doesn't make it to the bathroom.  He is having diarrhea 2-3 a day.   His appetite is not good.  No weight loss. No abdominal pain. He does c/o hemorrhoidal pain.   Hx of distal ulcerative colitis.    Colonoscopy  8.24.2010  INDICATIONS FOR PROCEDURE: A 65 year old gentleman with a history of  nonbloody diarrhea and a history of some degree of colitis, seen at  prior colonoscopies. Also has a history of colonic adenomas. He comes  for further evaluation of diarrhea and, in part, for polyp surveillance.  Risks, benefits, alternatives and limitations have been discussed,  questions answered. Please see the documentation in the medical record.    FINAL DIAGNOSIS  MICROSCOPIC EXAMINATION AND DIAGNOSIS   1. ASCENDING COLON, POLYP: TUBULAR ADENOMA. NO HIGH GRADE DYSPLASIA OR MALIGNANCY IDENTIFIED.  2. COLON, DESCENDING, POLYP, BIOPSIES: - MULTIPLE FRAGMENTS OF TWO ADENOMATOUS POLYPS, SOME WITH HIGH GRADE GLANDULAR DYSPLASIA. - NO INVASIVE CARCINOMA IDENTIFIED.  3. COLON, ASCENDING, BIOPSY: - FRAGMENTS OF BENIGN COLONIC MUCOSA. - NO ACTIVE MUCOSAL INFLAMMATION, GRANULOMAS OR DYSPLASIA IDENTIFIED.  4. COLON, SIGMOID, BIOPSIES: - TWO FRAGMENTS OF BENIGN COLONIC MUCOSA WITH NO ACTIVE MUCOSAL INFLAMMATION AND ONE FRAGMENT OF COLONIC MUCOSA WITH MUCUS DEPLETED GLANDS WITH REACTIVE LYMPHOID AGGREGATE. - SEE COMMENT.  5.  RECTUM, BIOPSIES: BENIGN RECTAL MUCOSA WITH NO EVIDENCE OF ACTIVE MUCOSAL INFLAMMATION, GRANULOMAS OR DYSPLASIA.       . Minimal anal canal internal hemorrhoids, otherwise normal-appearing  rectum, status post biopsy.  2. Surgical anastomosis 25 cm.  3. Pancolonic diverticula.  4. Multiple polyps in the descending and ascending segments, status  post hot and cold snare polypectomies.  5. The remaining colonic mucosa appeared normal, status post stool  sampling and segmental biopsies described above.  6. Normal terminal ileum.  7. Cecal withdrawal time 19 minutes.   Review of Systems see hpi    History   Social History  . Marital Status: Single    Spouse Name: N/A    Number of Children: N/A  . Years of Education: N/A   Occupational History  . Not on file.   Social History Main Topics  . Smoking status: Current Everyday Smoker  . Smokeless tobacco: Not on file   Comment: 1/2 pack a day.  . Alcohol Use: Yes     drink occasionally  . Drug Use: No  . Sexually Active: Not on file   Other Topics Concern  . Not on file   Social History Narrative  . No narrative on file   Past Surgical History  Procedure Date  . Cholecystectomy   . Colon surgery     for diverticulitis. Removed 13 inches were removed   Past Medical History  Diagnosis Date  . Hypertension   . Diabetes mellitus  x 30 yrs  . Colitis     Mucous colitis   Current Outpatient Prescriptions  Medication Sig Dispense Refill  . citalopram (CELEXA) 20 MG tablet Take 20 mg by mouth daily.        Marland Kitchen glyBURIDE (DIABETA) 2.5 MG tablet Take 2.5 mg by mouth daily with breakfast.        . lisinopril (PRINIVIL,ZESTRIL) 20 MG tablet Take 20 mg by mouth daily.        . pravastatin (PRAVACHOL) 40 MG tablet Take 40 mg by mouth daily.        . diphenoxylate-atropine (LOMOTIL) 2.5-0.025 MG per tablet Take 1 tablet by mouth 4 (four) times daily as needed for diarrhea/loose stools.  90 tablet  1   History   Social  History  . Marital Status: Single    Spouse Name: N/A    Number of Children: N/A  . Years of Education: N/A   Occupational History  . Not on file.   Social History Main Topics  . Smoking status: Current Everyday Smoker  . Smokeless tobacco: Not on file   Comment: 1/2 pack a day.  . Alcohol Use: Yes     drink occasionally  . Drug Use: No  . Sexually Active: Not on file   Other Topics Concern  . Not on file   Social History Narrative  . No narrative on file   History   Social History Narrative  . No narrative on file   Family Status  Relation Status Death Age  . Mother Deceased     old age 66  . Father Deceased     age 15 Circulatory problems.  (PVD)  . Brother Alive     peripheral neuropathy   Allergies  Allergen Reactions  . Metronidazole     REACTION: causes nausea and vomiting    Objective:   Physical Exam Filed Vitals:   10/14/10 1033  BP: 156/76  Pulse: 66  Temp: 98.5 F (36.9 C)  Height: 6\' 2"  (1.88 m)  Weight: 198 lb (89.812 kg)    Alert and oriented. Skin warm and dry. Oral mucosa is moist. Upper and lower dentures.. Sclera anicteric, conjunctivae is pink. Thyroid not enlarged. No cervical lymphadenopathy. Lungs clear. Heart regular rate and rhythm.  Abdomen is soft. Bowel sounds are positive. No hepatomegaly. No abdominal masses felt. No tenderness.  No edema to lower extremities. Patient is alert and oriented.     Assessment:    Diarrhea, with a hx of a colon resection for diverticulitis which has contributed to his diarrhea. Hx of multiple colonic polyps.     Plan:     Fiber tabs x 4 a day. E-prescribe Lomotil to pharmacy. He will follow up in 3 months.  He wiill be placed on a recall for a colonoscopy the first of next year.I discussed this case with Dr. Karilyn Cota. Patient states he is satisfied with his care.

## 2010-10-21 ENCOUNTER — Telehealth (INDEPENDENT_AMBULATORY_CARE_PROVIDER_SITE_OTHER): Payer: Self-pay | Admitting: *Deleted

## 2010-10-21 MED ORDER — DIPHENOXYLATE-ATROPINE 2.5-0.025 MG PO TABS: 1.0000 | ORAL_TABLET | Freq: Four times a day (QID) | ORAL | Status: DC | PRN

## 2010-10-21 NOTE — Telephone Encounter (Signed)
Patient called and there is a issue with his Lomotil at [facility].  We have not gotten anything from them.  Told him I would tell you and you would see what they wanted.

## 2010-10-22 LAB — CBC
HCT: 25.4 — ABNORMAL LOW
HCT: 25.8 — ABNORMAL LOW
HCT: 28.3 — ABNORMAL LOW
HCT: 30.4 — ABNORMAL LOW
Hemoglobin: 10.1 — ABNORMAL LOW
Hemoglobin: 10.5 — ABNORMAL LOW
Hemoglobin: 9.4 — ABNORMAL LOW
MCHC: 35.5
MCHC: 35.6
MCV: 85.5
MCV: 85.9
MCV: 86.9
MCV: 88.5
MCV: 88.9
MCV: 89
Platelets: 114 — ABNORMAL LOW
Platelets: 124 — ABNORMAL LOW
Platelets: 140 — ABNORMAL LOW
Platelets: 142 — ABNORMAL LOW
Platelets: 182
RBC: 3.01 — ABNORMAL LOW
RBC: 3.2 — ABNORMAL LOW
RBC: 3.56 — ABNORMAL LOW
RBC: 3.87 — ABNORMAL LOW
RDW: 16.6 — ABNORMAL HIGH
RDW: 16.6 — ABNORMAL HIGH
WBC: 2.6 — ABNORMAL LOW
WBC: 3 — ABNORMAL LOW
WBC: 3.3 — ABNORMAL LOW
WBC: 3.3 — ABNORMAL LOW
WBC: 4.7

## 2010-10-22 LAB — DIFFERENTIAL
Basophils Absolute: 0
Basophils Absolute: 0
Basophils Relative: 0
Basophils Relative: 1
Blasts: 0
Eosinophils Absolute: 0.1
Eosinophils Absolute: 0.1
Eosinophils Absolute: 0.1
Eosinophils Absolute: 0.1
Eosinophils Relative: 2
Eosinophils Relative: 2
Eosinophils Relative: 2
Eosinophils Relative: 2
Lymphocytes Relative: 39
Lymphocytes Relative: 44
Lymphocytes Relative: 56 — ABNORMAL HIGH
Lymphs Abs: 1.3
Lymphs Abs: 1.7
Lymphs Abs: 1.7
Lymphs Abs: 1.8
Metamyelocytes Relative: 0
Metamyelocytes Relative: 0
Monocytes Absolute: 0.3
Monocytes Absolute: 0.4
Monocytes Relative: 10
Monocytes Relative: 11
Monocytes Relative: 11
Monocytes Relative: 8
Myelocytes: 0
Neutro Abs: 1.1 — ABNORMAL LOW
Neutro Abs: 1.3 — ABNORMAL LOW
Neutrophils Relative %: 37 — ABNORMAL LOW
Neutrophils Relative %: 40 — ABNORMAL LOW
Neutrophils Relative %: 43
Promyelocytes Absolute: 0
nRBC: 0
nRBC: 0

## 2010-10-22 LAB — COMPREHENSIVE METABOLIC PANEL
ALT: 18
AST: 37
Albumin: 2.6 — ABNORMAL LOW
CO2: 26
Calcium: 7.5 — ABNORMAL LOW
Chloride: 104
Creatinine, Ser: 0.89
GFR calc Af Amer: >60
GFR calc non Af Amer: >60
Sodium: 138

## 2010-10-22 LAB — HEMOGLOBIN AND HEMATOCRIT, BLOOD
HCT: 25.9 — ABNORMAL LOW
HCT: 28 — ABNORMAL LOW
HCT: 31.8 — ABNORMAL LOW
Hemoglobin: 9.4 — ABNORMAL LOW
Hemoglobin: 9.9 — ABNORMAL LOW

## 2010-10-22 LAB — CROSSMATCH

## 2010-10-22 LAB — BASIC METABOLIC PANEL
CO2: 28
Calcium: 7.6 — ABNORMAL LOW
Chloride: 106
GFR calc Af Amer: >60
GFR calc non Af Amer: >60
Glucose, Bld: 134 — ABNORMAL HIGH
Potassium: 3.1 — ABNORMAL LOW
Potassium: 3.7
Sodium: 138
Sodium: 140

## 2010-10-22 LAB — PREPARE FRESH FROZEN PLASMA

## 2010-10-22 LAB — PROTIME-INR
INR: 1.4
INR: 2 — ABNORMAL HIGH
INR: 2.4 — ABNORMAL HIGH
INR: 5.6
Prothrombin Time: 24 — ABNORMAL HIGH
Prothrombin Time: 27.6 — ABNORMAL HIGH

## 2010-10-22 LAB — PREPARE RBC (CROSSMATCH)

## 2010-10-23 LAB — PROTIME-INR
INR: 2.7 — ABNORMAL HIGH
Prothrombin Time: 21.8 — ABNORMAL HIGH
Prothrombin Time: 30.3 — ABNORMAL HIGH

## 2010-10-23 LAB — PROTEIN, URINE, 24 HOUR: Protein, Urine: 14

## 2011-01-08 ENCOUNTER — Encounter (INDEPENDENT_AMBULATORY_CARE_PROVIDER_SITE_OTHER): Payer: Self-pay | Admitting: *Deleted

## 2011-02-27 ENCOUNTER — Encounter (INDEPENDENT_AMBULATORY_CARE_PROVIDER_SITE_OTHER): Payer: Self-pay | Admitting: *Deleted

## 2011-03-12 ENCOUNTER — Ambulatory Visit (INDEPENDENT_AMBULATORY_CARE_PROVIDER_SITE_OTHER): Payer: Medicare Other | Admitting: Internal Medicine

## 2011-03-27 ENCOUNTER — Encounter (INDEPENDENT_AMBULATORY_CARE_PROVIDER_SITE_OTHER): Payer: Self-pay | Admitting: *Deleted

## 2011-07-19 ENCOUNTER — Other Ambulatory Visit (INDEPENDENT_AMBULATORY_CARE_PROVIDER_SITE_OTHER): Payer: Self-pay | Admitting: Internal Medicine

## 2012-05-23 ENCOUNTER — Telehealth (INDEPENDENT_AMBULATORY_CARE_PROVIDER_SITE_OTHER): Payer: Self-pay | Admitting: *Deleted

## 2012-05-23 NOTE — Telephone Encounter (Signed)
Patient called and said he was told by Terri, his TCS was past due. I show in the computer the last TCS was on 08/29/08. Please advised when next TCS is due, so we can put him on the recall list.

## 2012-05-23 NOTE — Telephone Encounter (Signed)
He is due now for a colonoscopy.

## 2012-05-24 ENCOUNTER — Other Ambulatory Visit (INDEPENDENT_AMBULATORY_CARE_PROVIDER_SITE_OTHER): Payer: Self-pay | Admitting: *Deleted

## 2012-05-24 ENCOUNTER — Telehealth (INDEPENDENT_AMBULATORY_CARE_PROVIDER_SITE_OTHER): Payer: Self-pay | Admitting: *Deleted

## 2012-05-24 ENCOUNTER — Encounter (HOSPITAL_COMMUNITY): Payer: Self-pay | Admitting: Pharmacy Technician

## 2012-05-24 DIAGNOSIS — Z1211 Encounter for screening for malignant neoplasm of colon: Secondary | ICD-10-CM

## 2012-05-24 DIAGNOSIS — Z86010 Personal history of colonic polyps: Secondary | ICD-10-CM

## 2012-05-24 DIAGNOSIS — Z800 Family history of malignant neoplasm of digestive organs: Secondary | ICD-10-CM

## 2012-05-24 MED ORDER — PEG-KCL-NACL-NASULF-NA ASC-C 100 G PO SOLR: 1.0000 | Freq: Once | ORAL | Status: DC

## 2012-05-24 NOTE — Telephone Encounter (Signed)
lmom asking patient to call me so I can schedule TCS

## 2012-05-24 NOTE — Telephone Encounter (Signed)
  Procedure: tcs  Reason/Indication:  Hx polyps, fam hx colon ca  Has patient had this procedure before?  Yes, 2010 (EPIC)  If so, when, by whom and where?    Is there a family history of colon cancer?  Yes, 3 uncles  Who?  What age when diagnosed?    Is patient diabetic?   yes      Does patient have prosthetic heart valve?  no  Do you have a pacemaker?  no  Has patient ever had endocarditis? no  Has patient had joint replacement within last 12 months?  no  Is patient on Coumadin, Plavix and/or Aspirin? no  Medications: glyburide 2 mg daily (PM), lisinopril 20 mg daily, citalopram 20 mg daily, pravastatin 40 mg daily, alprazolam 5 yr TCS noted mg prn  Allergies: see EPIC  Medication Adjustment: hold glyburide evening before  Procedure date & time: 06/01/12 at 930

## 2012-05-24 NOTE — Telephone Encounter (Signed)
TCS sch'd 06/01/12, patient aware

## 2012-05-24 NOTE — Telephone Encounter (Signed)
agree

## 2012-06-01 ENCOUNTER — Encounter (HOSPITAL_COMMUNITY): Payer: Self-pay | Admitting: *Deleted

## 2012-06-01 ENCOUNTER — Encounter (HOSPITAL_COMMUNITY)
Admission: RE | Disposition: A | Discharge status: Home / Self Care | Payer: Self-pay | Source: Ambulatory Visit | Attending: Internal Medicine

## 2012-06-01 ENCOUNTER — Ambulatory Visit (HOSPITAL_COMMUNITY)
Admission: RE | Admit: 2012-06-01 | Discharge: 2012-06-01 | Disposition: A | Discharge status: Home / Self Care | Payer: Medicare Other | Source: Ambulatory Visit | Attending: Internal Medicine | Admitting: Internal Medicine

## 2012-06-01 DIAGNOSIS — D126 Benign neoplasm of colon, unspecified: Secondary | ICD-10-CM

## 2012-06-01 DIAGNOSIS — E119 Type 2 diabetes mellitus without complications: Secondary | ICD-10-CM | POA: Insufficient documentation

## 2012-06-01 DIAGNOSIS — Z01812 Encounter for preprocedural laboratory examination: Secondary | ICD-10-CM | POA: Insufficient documentation

## 2012-06-01 DIAGNOSIS — Z860100 Personal history of colon polyps, unspecified: Secondary | ICD-10-CM | POA: Insufficient documentation

## 2012-06-01 DIAGNOSIS — K5730 Diverticulosis of large intestine without perforation or abscess without bleeding: Secondary | ICD-10-CM | POA: Insufficient documentation

## 2012-06-01 DIAGNOSIS — Z800 Family history of malignant neoplasm of digestive organs: Secondary | ICD-10-CM

## 2012-06-01 DIAGNOSIS — K644 Residual hemorrhoidal skin tags: Secondary | ICD-10-CM

## 2012-06-01 DIAGNOSIS — I10 Essential (primary) hypertension: Secondary | ICD-10-CM | POA: Insufficient documentation

## 2012-06-01 HISTORY — PX: COLONOSCOPY: SHX5424

## 2012-06-01 LAB — GLUCOSE, CAPILLARY: Glucose-Capillary: 277 mg/dL — ABNORMAL HIGH (ref 70–99)

## 2012-06-01 SURGERY — COLONOSCOPY
Anesthesia: Moderate Sedation

## 2012-06-01 MED ORDER — PSYLLIUM 28 % PO PACK: 1.0000 | PACK | Freq: Every day | ORAL | Status: DC

## 2012-06-01 MED ORDER — SODIUM CHLORIDE 0.9 % IV SOLN
INTRAVENOUS | Status: DC
Start: 2012-06-01 — End: 2012-06-01
  Administered 2012-06-01: 1000 mL via INTRAVENOUS

## 2012-06-01 MED ORDER — MEPERIDINE HCL 50 MG/ML IJ SOLN
INTRAMUSCULAR | Status: DC | PRN
  Administered 2012-06-01: 25 mg via INTRAVENOUS

## 2012-06-01 MED ORDER — STERILE WATER FOR IRRIGATION IR SOLN
Status: DC | PRN
  Administered 2012-06-01: 11:00:00

## 2012-06-01 MED ORDER — MEPERIDINE HCL 50 MG/ML IJ SOLN
INTRAMUSCULAR | Status: AC
  Filled 2012-06-01: qty 1

## 2012-06-01 MED ORDER — MIDAZOLAM HCL 5 MG/5ML IJ SOLN
INTRAMUSCULAR | Status: AC
  Filled 2012-06-01: qty 10

## 2012-06-01 MED ORDER — MIDAZOLAM HCL 5 MG/5ML IJ SOLN
INTRAMUSCULAR | Status: DC | PRN
  Administered 2012-06-01: 2 mg via INTRAVENOUS

## 2012-06-01 NOTE — H&P (Addendum)
[name] is an 67 y.o. male.   Chief Complaint: Patient is here for colonoscopy. HPI: Patient 67 year old Caucasian male with history of colonic adenomas. Multiple adenomas removed on prior to colonoscopies. He denies change in bowel habits. He complains of excessive flatulence. Has occasional hematochezia felt secondary to hemorrhoids. Family history significant for colon carcinoma and 2 maternal uncles and they're in their 12s and one died of metastatic disease  Past Medical History  Diagnosis Date  . Hypertension   . Diabetes mellitus     x 30 yrs  . Colitis     Mucous colitis    Past Surgical History  Procedure Laterality Date  . Cholecystectomy    . Colon surgery      for diverticulitis. Removed 13 inches were removed    History reviewed. No pertinent family history. Social History:  reports that he has been smoking.  He does not have any smokeless tobacco history on file. He reports that  drinks alcohol. He reports that he does not use illicit drugs.  Allergies:  Allergies  Allergen Reactions  . Metronidazole     REACTION: causes nausea and vomiting    Medications Prior to Admission  Medication Sig Dispense Refill  . acetaminophen (TYLENOL) 500 MG tablet Take 1,000 mg by mouth every 6 (six) hours as needed for pain (headache).      . ALPRAZolam (XANAX) 0.5 MG tablet Take 0.5 mg by mouth 3 (three) times daily as needed for anxiety.      . citalopram (CELEXA) 20 MG tablet Take 20 mg by mouth every evening.       Marland Kitchen glipiZIDE (GLUCOTROL) 5 MG tablet Take 2.5 mg by mouth daily.      Marland Kitchen lisinopril (PRINIVIL,ZESTRIL) 20 MG tablet Take 20 mg by mouth every evening.       . naproxen sodium (ALEVE) 220 MG tablet Take 220 mg by mouth 2 (two) times daily as needed (headache/pain).      . peg 3350 powder (MOVIPREP) 100 G SOLR Take 1 kit (100 g total) by mouth once.  1 kit  0  . pravastatin (PRAVACHOL) 40 MG tablet Take 40 mg by mouth every evening.       . pregabalin  (LYRICA) 100 MG capsule Take 100 mg by mouth at bedtime.      Marland Kitchen glyBURIDE (DIABETA) 2.5 MG tablet Take 2.5 mg by mouth daily with breakfast.          Results for orders placed during the hospital encounter of 06/01/12 (from the past 48 hour(s))  GLUCOSE, CAPILLARY     Status: Abnormal   Collection Time    06/01/12 10:12 AM      Result Value Range   Glucose-Capillary 277 (*) 70 - 99 mg/dL   No results found.  ROS  Blood pressure 175/85, pulse 74, temperature 98.9 F (37.2 C), temperature source Oral, resp. rate 20, height 6\' 2"  (1.88 m), weight 195 lb (88.451 kg), SpO2 98.00%. Physical Exam  Constitutional: He appears well-developed and well-nourished.  HENT:  Mouth/Throat: Oropharynx is clear and moist.  Eyes: Conjunctivae are normal. No scleral icterus.  Neck: No thyromegaly present.  Cardiovascular: Normal rate, regular rhythm and normal heart sounds.   No murmur heard. Respiratory: Effort normal and breath sounds normal.  GI: Soft. He exhibits no distension and no mass. There is no tenderness.  Musculoskeletal: He exhibits no edema.  Lymphadenopathy:    He has no cervical adenopathy.  Skin: Skin is warm and dry.  Assessment/Plan History of colonic adenomas. Family history of colon carcinoma in second degree relatives. Surveillance colonoscopy.  Quandra Fedorchak U 06/01/2012, 11:19 AM

## 2012-06-01 NOTE — Op Note (Signed)
COLONOSCOPY PROCEDURE REPORT  PATIENT:  [name]  MR#:  [id number] Birthdate:  Feb 25, 1945, 67 y.o., male Endoscopist:  Dr. Malissa Hippo, MD Referred By:  Dr. Oneal Deputy. Juanetta Gosling, MD  Procedure Date: 06/01/2012  Procedure:   Colonoscopy  Indications:  Patient is 67 year old Caucasian male with history of colonic adenomas and colon carcinoma in 2 second-degree relatives. He has had multiple colonic adenomas removed on 2 prior colonoscopies. The last one was in August 2010.  Informed Consent:  The procedure and risks were reviewed with the patient and informed consent was obtained.  Medications:  Demerol 25 mg IV Versed 2 mg IV  Description of procedure:  After a digital rectal exam was performed, that colonoscope was advanced from the anus through the rectum and colon to the area of the cecum, ileocecal valve and appendiceal orifice. The cecum was deeply intubated. These structures were well-seen and photographed for the record. From the level of the cecum and ileocecal valve, the scope was slowly and cautiously withdrawn. The mucosal surfaces were carefully surveyed utilizing scope tip to flexion to facilitate fold flattening as needed. The scope was pulled down into the rectum where a thorough exam including retroflexion was performed.  Findings:   Prep satisfactory. 2 small polyps ablated via cold biopsy. These are located at cecum and sigmoid colon and these are submitted together. Few small diverticula noted at ascending colon. Wide-open colonic anastomosis located 25 cm from the anal margin. Normal rectal mucosa. Moderate-sized hemorrhoids noted below the dentate line.   Therapeutic/Diagnostic Maneuvers Performed:  See above  Complications:  None  Cecal Withdrawal Time:  19 minutes  Impression:  Examination performed to cecum. Two small polyps ablated via cold biopsy and submitted together(cecum and sigmoid colon).. Colonic anastomosis located at 25 cm from the anal  margin. External hemorrhoids.   Recommendations:  Standard instructions given. I will contact patient with biopsy results and further recommendations. Xifaxan (550 mg) two thirds of a tablet by mouth 3 times a day for 10 days.  REHMAN,NAJEEB U  06/01/2012 11:51 AM  CC: Dr. Fredirick Maudlin, MD & Dr. Bonnetta Barry ref. provider found

## 2012-06-02 ENCOUNTER — Encounter (HOSPITAL_COMMUNITY): Payer: Self-pay | Admitting: Internal Medicine

## 2013-07-19 ENCOUNTER — Encounter (HOSPITAL_COMMUNITY): Payer: Self-pay

## 2013-07-19 ENCOUNTER — Encounter (HOSPITAL_COMMUNITY): Payer: Medicare Other | Attending: Hematology and Oncology

## 2013-07-19 VITALS — BP 141/69 | HR 81 | Temp 98.3°F | Resp 18 | Ht 71.25 in | Wt 206.8 lb

## 2013-07-19 DIAGNOSIS — Z860100 Personal history of colon polyps, unspecified: Secondary | ICD-10-CM | POA: Insufficient documentation

## 2013-07-19 DIAGNOSIS — D72819 Decreased white blood cell count, unspecified: Secondary | ICD-10-CM | POA: Diagnosis not present

## 2013-07-19 DIAGNOSIS — F17200 Nicotine dependence, unspecified, uncomplicated: Secondary | ICD-10-CM | POA: Diagnosis not present

## 2013-07-19 DIAGNOSIS — K5790 Diverticulosis of intestine, part unspecified, without perforation or abscess without bleeding: Secondary | ICD-10-CM | POA: Insufficient documentation

## 2013-07-19 DIAGNOSIS — K5730 Diverticulosis of large intestine without perforation or abscess without bleeding: Secondary | ICD-10-CM | POA: Diagnosis not present

## 2013-07-19 DIAGNOSIS — I10 Essential (primary) hypertension: Secondary | ICD-10-CM

## 2013-07-19 DIAGNOSIS — E781 Pure hyperglyceridemia: Secondary | ICD-10-CM | POA: Diagnosis not present

## 2013-07-19 DIAGNOSIS — E119 Type 2 diabetes mellitus without complications: Secondary | ICD-10-CM | POA: Diagnosis not present

## 2013-07-19 DIAGNOSIS — D696 Thrombocytopenia, unspecified: Secondary | ICD-10-CM | POA: Diagnosis present

## 2013-07-19 LAB — CBC WITH DIFFERENTIAL/PLATELET
Basophils Absolute: 0 10*3/uL (ref 0.0–0.1)
Basophils Relative: 0 % (ref 0–1)
Eosinophils Absolute: 0 10*3/uL (ref 0.0–0.7)
Eosinophils Relative: 2 % (ref 0–5)
HCT: 43.3 % (ref 39.0–52.0)
Hemoglobin: 15.7 g/dL (ref 13.0–17.0)
LYMPHS ABS: 0.7 10*3/uL (ref 0.7–4.0)
Lymphocytes Relative: 29 % (ref 12–46)
MCH: 34.8 pg — AB (ref 26.0–34.0)
MCHC: 36.3 g/dL — AB (ref 30.0–36.0)
MCV: 96 fL (ref 78.0–100.0)
MONO ABS: 0.2 10*3/uL (ref 0.1–1.0)
Monocytes Relative: 10 % (ref 3–12)
Neutro Abs: 1.5 10*3/uL — ABNORMAL LOW (ref 1.7–7.7)
Neutrophils Relative %: 59 % (ref 43–77)
PLATELETS: 60 10*3/uL — AB (ref 150–400)
RBC: 4.51 MIL/uL (ref 4.22–5.81)
RDW: 13.8 % (ref 11.5–15.5)
WBC: 2.4 10*3/uL — ABNORMAL LOW (ref 4.0–10.5)

## 2013-07-19 LAB — COMPREHENSIVE METABOLIC PANEL
ALK PHOS: 58 U/L (ref 39–117)
ALT: 33 U/L (ref 0–53)
ANION GAP: 11 (ref 5–15)
AST: 35 U/L (ref 0–37)
Albumin: 3.6 g/dL (ref 3.5–5.2)
BILIRUBIN TOTAL: 1 mg/dL (ref 0.3–1.2)
BUN: 10 mg/dL (ref 6–23)
CHLORIDE: 100 meq/L (ref 96–112)
CO2: 28 mEq/L (ref 19–32)
Calcium: 9.9 mg/dL (ref 8.4–10.5)
Creatinine, Ser: 1.11 mg/dL (ref 0.50–1.35)
GFR calc non Af Amer: 66 mL/min — ABNORMAL LOW (ref >90)
GFR, EST AFRICAN AMERICAN: 77 mL/min — AB (ref >90)
GLUCOSE: 287 mg/dL — AB (ref 70–99)
POTASSIUM: 4.5 meq/L (ref 3.7–5.3)
SODIUM: 139 meq/L (ref 137–147)
TOTAL PROTEIN: 6.9 g/dL (ref 6.0–8.3)

## 2013-07-19 LAB — RETICULOCYTES
RBC.: 4.51 MIL/uL (ref 4.22–5.81)
Retic Count, Absolute: 157.9 10*3/uL (ref 19.0–186.0)
Retic Ct Pct: 3.5 % — ABNORMAL HIGH (ref 0.4–3.1)

## 2013-07-19 LAB — LACTATE DEHYDROGENASE: LDH: 189 U/L (ref 94–250)

## 2013-07-19 NOTE — Progress Notes (Signed)
[place] A. Barnet Glasgow, M.D.  NEW PATIENT EVALUATION   Name: [name] Date: 07/20/2013 MRN: [id number] DOB: [birth year]  PCP: Wende Neighbors, M.D. REFERRING PHYSICIAN: Wende Neighbors, M.D.  REASON FOR REFERRAL: Thrombocytopenia, leukopenia     HISTORY OF PRESENT ILLNESS:[name] is a 68 y.o. male who is referred by family physician because of leukopenia and thrombocytopenia no at least since April of 2015.  Aside from fatigue, the patient has felt well with good appetite. He does have easy satiety but denies any fever, night sweats, sore throat, lymphadenopathy, lower extremity swelling or redness, chest pain, PND, orthopnea, skin rash, joint pain, headache, or seizures. He does admit to easy bruisability but has not had frequent infections. Leukopenia, cytopenia abdomen documented as far back as October 2014.   PAST MEDICAL HISTORY:  has a past medical history of Hypertension; Diabetes mellitus; Colitis; High triglycerides; Peripheral neuropathy; and Femoral deep venous thrombosis (DVT) (2009).     PAST SURGICAL HISTORY: Past Surgical History  Procedure Laterality Date  . Cholecystectomy    . Colon surgery      for diverticulitis. Removed 13 inches were removed  . Colonoscopy N/A 06/01/2012    Procedure: COLONOSCOPY;  Surgeon: Rogene Houston, MD;  Location: [facility];  Service: Endoscopy;  Laterality: N/A;  930-moved to [address] to notify pt     CURRENT MEDICATIONS: has a current medication list which includes the following prescription(s): alprazolam, fenofibric acid, desvenlafaxine succinate, fiber, glipizide, lisinopril, metformin, naproxen sodium, omega 3, pravastatin, pregabalin, zolpidem, and acetaminophen.   ALLERGIES: Metronidazole   SOCIAL HISTORY:  reports that he has been smoking.  He has never used smokeless tobacco. He reports that he drinks alcohol. He reports that he does not use illicit  drugs.   FAMILY HISTORY: family history is not on file.    REVIEW OF SYSTEMS:  Other than that discussed above is noncontributory.    PHYSICAL EXAM:  height is 5' 11.25" (1.81 m) and weight is 206 lb 12.8 oz (93.804 kg). His oral temperature is 98.3 F (36.8 C). His blood pressure is 141/69 and his pulse is 81. His respiration is 18 and oxygen saturation is 97%.    GENERAL:alert, no distress and comfortablel SKIN: skin color, texture, turgor are normal, no rashes or significant lesions EYES: normal, Conjunctiva are pink and non-injected, sclera clear OROPHARYNX:no exudate, no erythema and lips, buccal mucosa, and tongue normal  NECK: supple, thyroid normal size, non-tender, without nodularity CHEST: Normal AP diameter with no breast masses. LYMPH:  no palpable lymphadenopathy in the cervical, axillary or inguinal LUNGS: clear to auscultation and percussion with normal breathing effort HEART: regular rate & rhythm and no murmurs ABDOMEN:abdomen soft, non-tender and normal bowel sounds. Obese with possible spleen palpable. MUSCULOSKELETALl:no cyanosis of digits, no clubbing or edema  NEURO: alert & oriented x 3 with fluent speech, no focal motor/sensory deficits    LABORATORY DATA:  10/13/2012: WBC 3.2, hemoglobin 16.5, platelets 56,000. 01/23/2013: WBC 5.0, hemoglobin 17.2, platelets 75,000 04/28/2013:  WBC 2.4 hemoglobin 15.2 platelets 61,000 with ANC of 1.0, MCV 95.5  Office Visit on 07/19/2013  Component Date Value Ref Range Status  . WBC 07/19/2013 2.4* 4.0 - 10.5 K/uL Final  . RBC 07/19/2013 4.51  4.22 - 5.81 MIL/uL Final  . Hemoglobin 07/19/2013 15.7  13.0 - 17.0 g/dL Final  . HCT 07/19/2013 43.3  39.0 - 52.0 % Final  . MCV 07/19/2013  96.0  78.0 - 100.0 fL Final  . MCH 07/19/2013 34.8* 26.0 - 34.0 pg Final  . MCHC 07/19/2013 36.3* 30.0 - 36.0 g/dL Final  . RDW 07/19/2013 13.8  11.5 - 15.5 % Final  . Platelets 07/19/2013 60* 150 - 400 K/uL Final   SPECIMEN CHECKED FOR  CLOTS  . Neutrophils Relative % 07/19/2013 59  43 - 77 % Final  . Lymphocytes Relative 07/19/2013 29  12 - 46 % Final  . Monocytes Relative 07/19/2013 10  3 - 12 % Final  . Eosinophils Relative 07/19/2013 2  0 - 5 % Final  . Basophils Relative 07/19/2013 0  0 - 1 % Final  . Neutro Abs 07/19/2013 1.5* 1.7 - 7.7 K/uL Final  . Lymphs Abs 07/19/2013 0.7  0.7 - 4.0 K/uL Final  . Monocytes Absolute 07/19/2013 0.2  0.1 - 1.0 K/uL Final  . Eosinophils Absolute 07/19/2013 0.0  0.0 - 0.7 K/uL Final  . Basophils Absolute 07/19/2013 0.0  0.0 - 0.1 K/uL Final  . RBC Morphology 07/19/2013 POLYCHROMASIA PRESENT   Final  . WBC Morphology 07/19/2013 ATYPICAL LYMPHOCYTES   Final  . Smear Review 07/19/2013 PLATELET COUNT CONFIRMED BY SMEAR   Final   Comment: PLATELETS APPEAR DECREASED                          LARGE PLATELETS PRESENT                          GIANT PLATELETS SEEN  . Retic Ct Pct 07/19/2013 3.5* 0.4 - 3.1 % Final  . RBC. 07/19/2013 4.51  4.22 - 5.81 MIL/uL Final  . Retic Count, Manual 07/19/2013 157.9  19.0 - 186.0 K/uL Final  . LDH 07/19/2013 189  94 - 250 U/L Final   SLIGHT HEMOLYSIS  . Sodium 07/19/2013 139  137 - 147 mEq/L Final  . Potassium 07/19/2013 4.5  3.7 - 5.3 mEq/L Final  . Chloride 07/19/2013 100  96 - 112 mEq/L Final  . CO2 07/19/2013 28  19 - 32 mEq/L Final  . Glucose, Bld 07/19/2013 287* 70 - 99 mg/dL Final  . BUN 07/19/2013 10  6 - 23 mg/dL Final  . Creatinine, Ser 07/19/2013 1.11  0.50 - 1.35 mg/dL Final  . Calcium 07/19/2013 9.9  8.4 - 10.5 mg/dL Final  . Total Protein 07/19/2013 6.9  6.0 - 8.3 g/dL Final  . Albumin 07/19/2013 3.6  3.5 - 5.2 g/dL Final  . AST 07/19/2013 35  0 - 37 U/L Final  . ALT 07/19/2013 33  0 - 53 U/L Final  . Alkaline Phosphatase 07/19/2013 58  39 - 117 U/L Final  . Total Bilirubin 07/19/2013 1.0  0.3 - 1.2 mg/dL Final  . GFR calc non Af Amer 07/19/2013 66* >90 mL/min Final  . GFR calc Af Amer 07/19/2013 77* >90 mL/min Final   Comment: (NOTE)                           The eGFR has been calculated using the CKD EPI equation.                          This calculation has not been validated in all clinical situations.  eGFR's persistently <90 mL/min signify possible Chronic Kidney                          Disease.  . Anion gap 07/19/2013 11  5 - 15 Final    Urinalysis    Component Value Date/Time   COLORURINE YELLOW 03/28/2010 1427   APPEARANCEUR CLEAR 03/28/2010 1427   LABSPEC >1.030* 03/28/2010 1427   PHURINE 5.0 03/28/2010 1427   GLUCOSEU >1000* 03/28/2010 1427   HGBUR TRACE* 03/28/2010 1427   BILIRUBINUR SMALL* 03/28/2010 1427   KETONESUR TRACE* 03/28/2010 1427   PROTEINUR TRACE* 03/28/2010 1427   UROBILINOGEN 0.2 03/28/2010 1427   NITRITE NEGATIVE 03/28/2010 1427   LEUKOCYTESUR NEGATIVE 03/28/2010 1427      @RADIOGRAPHY : No results found.  PATHOLOGY: Peripheral smear failed to reveal evidence of premature forms, basophilic stippling, or hypersegmented neutrophils.   IMPRESSION:  #1. Leukopenia and thrombocytopenia with normal red cell count, possible immune mechanism versus primary bone marrow disorder versus vascular effects from enlarged spleen. #2. Diabetes mellitus, type II, non-insulin requiring, controlled. #3. Hypertension, controlled. #4. Diverticulosis of the large intestine, asymptomatic. #5. History of colonic polyps.   PLAN:  #1. Additional lab tests were done today to rule out immune mechanisms for leukopenia and thrombocytopenia. #2. Ultrasound abdomen for assessment of the liver architecture and spleen size. #3. If nonrevealing, bone marrow aspiration biopsy will be scheduled at [facility]. #4. Followup in 3 weeks with CBC  I appreciate the opportunity of sharing in his care.   Doroteo Bradford, MD 07/20/2013 6:21 AM   DISCLAIMER:  This note was dictated with voice recognition softwre.  Similar sounding words can inadvertently be transcribed inaccurately and may not be  corrected upon review.

## 2013-07-19 NOTE — Patient Instructions (Addendum)
[facility] Discharge Instructions  RECOMMENDATIONS MADE BY THE CONSULTANT AND ANY TEST RESULTS WILL BE SENT TO YOUR REFERRING PHYSICIAN.  EXAM FINDINGS BY THE PHYSICIAN TODAY AND SIGNS OR SYMPTOMS TO REPORT TO CLINIC OR PRIMARY PHYSICIAN: Exam and findings as discussed by Dr. Barnet Glasgow.  Will be checking some labs today and will get an ultrasound of your liver and spleen.  You could have a Myelodysplastic Syndrome.  Will see you back in 3 weeks to discuss all test results.  MEDICATIONS PRESCRIBED:  none  INSTRUCTIONS/FOLLOW-UP: Ultrasound of liver and spleen as scheduled and lab and office visit in 3 weeks.  Thank you for choosing [facility] to provide your oncology and hematology care.  To afford each patient quality time with our providers, please arrive at least 15 minutes before your scheduled appointment time.  With your help, our goal is to use those 15 minutes to complete the necessary work-up to ensure our physicians have the information they need to help with your evaluation and healthcare recommendations.    Effective January 1st, 2014, we ask that you re-schedule your appointment with our physicians should you arrive 10 or more minutes late for your appointment.  We strive to give you quality time with our providers, and arriving late affects you and other patients whose appointments are after yours.    Again, thank you for choosing [facility].  Our hope is that these requests will decrease the amount of time that you wait before being seen by our physicians.       _____________________________________________________________  Should you have questions after your visit to [facility], please contact our office at [phone] between the hours of 8:30 a.m. and 4:30 p.m.  Voicemails left after 4:30 p.m. will not be returned until the following business day.  For prescription refill requests, have your pharmacy contact our  office with your prescription refill request.    _______________________________________________________________  We hope that we have given you very good care.  You may receive a patient satisfaction survey in the mail, please complete it and return it as soon as possible.  We value your feedback!  _______________________________________________________________  Have you asked about our STAR program?  STAR stands for Survivorship Training and Rehabilitation, and this is a nationally recognized cancer care program that focuses on survivorship and rehabilitation.  Cancer and cancer treatments may cause problems, such as, pain, making you feel tired and keeping you from doing the things that you need or want to do. Cancer rehabilitation can help. Our goal is to reduce these troubling effects and help you have the best quality of life possible.  You may receive a survey from a nurse that asks questions about your current state of health.  Based on the survey results, all eligible patients will be referred to the [facility] program for an evaluation so we can better serve you!  A frequently asked questions sheet is available upon request. Myelodysplastic Syndrome Myelodysplastic syndrome (MDS) is a group of blood diseases that affects new blood cells. This syndrome starts in the bone marrow where new blood cells are made. New blood cells are called immature cells or stem cells. With MDS, some immature cells do not grow into adult blood cells. Immature blood cells cannot live for very long in the body and they die. Over time, immature blood cells crowd out normal adult blood cells. This causes a low blood count. The bone marrow produces red blood  cells which carry oxygen, white blood cells which fight infection, and platelets which help stop bleeding. If you have too few red blood cells, your blood may not have enough oxygen, and you will feel tired. If you do not have enough white blood cells, you might get  infections often. If youhave too few platelets, you could bruise or bleed easily. CAUSES Sometimes, the cause is unknown. Known causes include:  Being exposed to certain drugs or chemicals.   Radiation treatment. This treatment is often given to fight cancer. Some things make it more likely that MDS will develop. These are called risk factors. They include:   Being white (Caucasian).   Being male.   Being older than 60 years.   Having been treated with cancer drugs or radiation.   Having been exposed to tobacco smoke, pesticides, lead, mercury, or the benzene chemical.  SYMPTOMS As MDS develops, the body reaches a point where it does not have enough adult blood cells. That is when symptoms usually start. Symptoms often develop slowly over time. The type of symptoms people have depends on which blood cells are affected. Symptoms may include:   Being tired all the time.   Feeling out of breath.   Having pale skin.   Getting infections often.   Having a fever often.   Bruising easily.   Bleeding that lasts longer than normal.   Having tiny spots of bleeding under the skin (petechiae). DIAGNOSIS A physical exam is performed. Blood tests that may be performed include:  A complete blood count. This test counts the number of red, white, and platelet blood cells.   Peripheral blood smear. This test checks the number and type of blood cells. It also checks their size and shape.   Bone marrow testing. A small piece of bone marrow is examined under a microscope to look for MDS.  TREATMENT There is no cure for MDS. However, treatment is directed at relieving symptoms. Treatment can also slow down the making of immature blood cells. The type of treatment that is best for you depends on the type of MDS you have. Common treatments include:   Blood transfusions.  Medicines that:  Slow down the number of immature blood cells that are made.   Help immature cells  develop into adult cells.   Make more blood cells.   Treat infections (antibiotics).  Bone marrow stem cell transplant. Your stem cells would be replaced with healthy stem cells from a donor.  HOME CARE INSTRUCTIONS  Only take medicine as prescribed by your caregiver. Follow the directions carefully. Do not start a new medicine unless your caregiver says it is okay.   Ask your caregiver if it is okay to take aspirin. Aspirin can make bleeding more likely.   Avoid dangerous activities to prevent bleeding. Ask your caregiver what activities are safe for you.   Check with your caregiver before you have any minor surgery or dental procedure.   Wash all fruits and vegetables before eating them. Make sure meat and eggs are well-cooked. Do not eat raw fish and shellfish.   Wash your hands often. You can also use hand sanitizer. Stay away from people who are sick.   Keep all your follow-up appointments. SEEK MEDICAL CARE IF:  You feel more tired than normal.   You think you might have an infection.   You have more bruising than usual.   You have trouble catching your breath.  SEEK IMMEDIATE MEDICAL CARE IF:   You  have an infection that is getting worse.   You have bleeding that you cannot stop.   You have trouble breathing.   You have chest pain.   You have a fever. Document Released: 06/23/2011 Document Reviewed: 06/23/2011 [facility] Patient Information 2015 [place]. This information is not intended to replace advice given to you by your health care provider. Make sure you discuss any questions you have with your health care provider.

## 2013-07-20 LAB — FOLATE: Folate: 6 ng/mL

## 2013-07-20 LAB — ANA: ANA: NEGATIVE

## 2013-07-20 LAB — VITAMIN B12: Vitamin B-12: 360 pg/mL (ref 211–911)

## 2013-07-21 LAB — CARDIOLIPIN ANTIBODY: Phospholipids: 198 mg/dL (ref 151–264)

## 2013-07-25 ENCOUNTER — Ambulatory Visit (HOSPITAL_COMMUNITY)
Admission: RE | Admit: 2013-07-25 | Discharge: 2013-07-25 | Disposition: A | Discharge status: Home / Self Care | Payer: Medicare Other | Source: Ambulatory Visit | Attending: Hematology and Oncology | Admitting: Hematology and Oncology

## 2013-07-25 ENCOUNTER — Telehealth (HOSPITAL_COMMUNITY): Payer: Self-pay | Admitting: Oncology

## 2013-07-25 DIAGNOSIS — K7689 Other specified diseases of liver: Secondary | ICD-10-CM | POA: Insufficient documentation

## 2013-07-25 DIAGNOSIS — R161 Splenomegaly, not elsewhere classified: Secondary | ICD-10-CM | POA: Insufficient documentation

## 2013-07-25 DIAGNOSIS — D696 Thrombocytopenia, unspecified: Secondary | ICD-10-CM

## 2013-07-25 DIAGNOSIS — D649 Anemia, unspecified: Secondary | ICD-10-CM | POA: Insufficient documentation

## 2013-07-25 DIAGNOSIS — D72819 Decreased white blood cell count, unspecified: Secondary | ICD-10-CM

## 2013-07-25 NOTE — Telephone Encounter (Signed)
Patient called requesting results from [place].  I personally reviewed and went over radiographic studies with the patient.  The results are noted within this dictation.    Jahniah Pallas,[place] 07/25/2013

## 2013-08-10 ENCOUNTER — Encounter (HOSPITAL_BASED_OUTPATIENT_CLINIC_OR_DEPARTMENT_OTHER): Payer: Medicare Other

## 2013-08-10 ENCOUNTER — Encounter (HOSPITAL_COMMUNITY): Payer: Self-pay

## 2013-08-10 ENCOUNTER — Encounter (HOSPITAL_COMMUNITY): Payer: Medicare Other | Attending: Hematology and Oncology

## 2013-08-10 VITALS — BP 171/79 | HR 74 | Temp 98.2°F | Resp 22 | Wt 212.0 lb

## 2013-08-10 DIAGNOSIS — D72819 Decreased white blood cell count, unspecified: Secondary | ICD-10-CM

## 2013-08-10 DIAGNOSIS — K7460 Unspecified cirrhosis of liver: Secondary | ICD-10-CM | POA: Diagnosis present

## 2013-08-10 DIAGNOSIS — D696 Thrombocytopenia, unspecified: Secondary | ICD-10-CM

## 2013-08-10 LAB — CBC WITH DIFFERENTIAL/PLATELET
BASOS PCT: 0 % (ref 0–1)
Basophils Absolute: 0 10*3/uL (ref 0.0–0.1)
EOS PCT: 3 % (ref 0–5)
Eosinophils Absolute: 0.1 10*3/uL (ref 0.0–0.7)
HEMATOCRIT: 43.4 % (ref 39.0–52.0)
Hemoglobin: 16 g/dL (ref 13.0–17.0)
LYMPHS PCT: 38 % (ref 12–46)
Lymphs Abs: 0.9 10*3/uL (ref 0.7–4.0)
MCH: 35.2 pg — ABNORMAL HIGH (ref 26.0–34.0)
MCHC: 36.9 g/dL — AB (ref 30.0–36.0)
MCV: 95.4 fL (ref 78.0–100.0)
MONO ABS: 0.2 10*3/uL (ref 0.1–1.0)
Monocytes Relative: 9 % (ref 3–12)
Neutro Abs: 1.2 10*3/uL — ABNORMAL LOW (ref 1.7–7.7)
Neutrophils Relative %: 50 % (ref 43–77)
PLATELETS: 62 10*3/uL — AB (ref 150–400)
RBC: 4.55 MIL/uL (ref 4.22–5.81)
RDW: 13.4 % (ref 11.5–15.5)
WBC: 2.4 10*3/uL — AB (ref 4.0–10.5)

## 2013-08-10 LAB — HEPATITIS B SURFACE ANTIBODY,QUALITATIVE: Hep B S Ab: POSITIVE — AB

## 2013-08-10 LAB — HEPATITIS A ANTIBODY, TOTAL: HEP A TOTAL AB: NONREACTIVE

## 2013-08-10 LAB — HEPATITIS B CORE ANTIBODY, TOTAL: Hep B Core Total Ab: NONREACTIVE

## 2013-08-10 LAB — HEPATITIS C ANTIBODY: HCV Ab: NEGATIVE

## 2013-08-10 NOTE — Patient Instructions (Signed)
[facility] Discharge Instructions  RECOMMENDATIONS MADE BY THE CONSULTANT AND ANY TEST RESULTS WILL BE SENT TO YOUR REFERRING PHYSICIAN.  EXAM FINDINGS BY THE PHYSICIAN TODAY AND SIGNS OR SYMPTOMS TO REPORT TO CLINIC OR PRIMARY PHYSICIAN: Exam and findings as discussed by Dr.Formanek.  MEDICATIONS PRESCRIBED:  Continue as prescribed.  INSTRUCTIONS/FOLLOW-UP: Refer back to [facility]. 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 No follow up needed in this clinic as this time.   Thank you for choosing [facility] to provide your oncology and hematology care.  To afford each patient quality time with our providers, please arrive at least 15 minutes before your scheduled appointment time.  With your help, our goal is to use those 15 minutes to complete the necessary work-up to ensure our physicians have the information they need to help with your evaluation and healthcare recommendations.    Effective January 1st, 2014, we ask that you re-schedule your appointment with our physicians should you arrive 10 or more minutes late for your appointment.  We strive to give you quality time with our providers, and arriving late affects you and other patients whose appointments are after yours.    Again, thank you for choosing [facility].  Our hope is that these requests will decrease the amount of time that you wait before being seen by our physicians.       _____________________________________________________________  Should you have questions after your visit to [facility], please contact our office at [phone] between the hours of 8:30 a.m. and 4:30 p.m.  Voicemails left after 4:30 p.m. will not be returned until the following business day.  For prescription refill requests, have your pharmacy contact our office with your prescription refill request.     _______________________________________________________________  We hope that we have given you very good care.  You may receive a patient satisfaction survey in the mail, please complete it and return it as soon as possible.  We value your feedback!  _______________________________________________________________  Have you asked about our STAR program?  STAR stands for Survivorship Training and Rehabilitation, and this is a nationally recognized cancer care program that focuses on survivorship and rehabilitation.  Cancer and cancer treatments may cause problems, such as, pain, making you feel tired and keeping you from doing the things that you need or want to do. Cancer rehabilitation can help. Our goal is to reduce these troubling effects and help you have the best quality of life possible.  You may receive a survey from a nurse that asks questions about your current state of health.  Based on the survey results, all eligible patients will be referred to the [facility] program for an evaluation so we can better serve you!  A frequently asked questions sheet is available upon request.

## 2013-08-10 NOTE — Progress Notes (Signed)
[facility]  OFFICE PROGRESS NOTE  [place], Thedore Mins, MD  [place] [address]  DIAGNOSIS: Thrombocytopenia, unspecified  Leukopenia  Cirrhosis of liver without ascites, unspecified hepatic cirrhosis type - Plan: Hepatitis A antibody, total, Hepatitis B core antibody, total, Hepatitis B e antibody, Hepatitis B surface antibody, Hepatitis C antibody  Chief Complaint  Patient presents with  . Pancytopenia    CURRENT THERAPY: Completed workup for pancytopenia.  INTERVAL HISTORY: [name] 68 y.o. male returns for followup after additional testing to explain neutropenia and thrombocytopenia.  He offers no new complaints since his last visit. Denies easy bruising, epistaxis, melena, hematochezia, hematuria, lower extremity swelling or redness, PND, orthopnea, palpitations, headache, or seizures.  MEDICAL HISTORY: Past Medical History  Diagnosis Date  . Hypertension   . Diabetes mellitus     x 30 yrs  . Colitis     Mucous colitis  . High triglycerides   . Peripheral neuropathy   . Femoral deep venous thrombosis (DVT) 2009    both upper legs    INTERIM HISTORY: has DM; HYPERLIPIDEMIA; SMOKER; HYPERTENSION; EXTERNAL HEMORRHOIDS; COLITIS; DIVERTICULITIS, COLON; DIARRHEA; COLONIC POLYPS, ADENOMATOUS, HX OF; and Diverticulosis on his problem list.    ALLERGIES:  is allergic to metronidazole.  MEDICATIONS: has a current medication list which includes the following prescription(s): acetaminophen, alprazolam, fenofibric acid, desvenlafaxine succinate, fiber, glipizide, lisinopril, metformin, naproxen sodium, omega 3, pravastatin, pregabalin, and zolpidem.  SURGICAL HISTORY:  Past Surgical History  Procedure Laterality Date  . Cholecystectomy    . Colon surgery      for diverticulitis. Removed 13 inches were removed  . Colonoscopy N/A 06/01/2012    Procedure: COLONOSCOPY;  Surgeon: Rogene Houston, MD;  Location: [facility];  Service: Endoscopy;  Laterality: N/A;  930-moved to 1025 Ann to notify pt    FAMILY HISTORY: family history is not on file.  SOCIAL HISTORY:  reports that he has been smoking.  He has never used smokeless tobacco. He reports that he drinks alcohol. He reports that he does not use illicit drugs.  REVIEW OF SYSTEMS:  Other than that discussed above is noncontributory.  PHYSICAL EXAMINATION: ECOG PERFORMANCE STATUS: 0 - Asymptomatic  Blood pressure 171/79, pulse 74, temperature 98.2 F (36.8 C), temperature source Oral, resp. rate 22, weight 212 lb (96.163 kg).  GENERAL:alert, no distress and comfortable SKIN: skin color, texture, turgor are normal, no rashes or significant lesions EYES: PERLA; Conjunctiva are pink and non-injected, sclera clear SINUSES: No redness or tenderness over maxillary or ethmoid sinuses OROPHARYNX:no exudate, no erythema on lips, buccal mucosa, or tongue. NECK: supple, thyroid normal size, non-tender, without nodularity. No masses CHEST: Increased AP diameter with no gynecomastia. LYMPH:  no palpable lymphadenopathy in the cervical, axillary or inguinal LUNGS: clear to auscultation and percussion with normal breathing effort HEART: regular rate & rhythm and no murmurs. ABDOMEN:abdomen soft, non-tender and normal bowel sounds. Obese with spleen tip palpable. MUSCULOSKELETAL:no cyanosis of digits and no clubbing. Range of motion normal.  NEURO: alert & oriented x 3 with fluent speech, no focal motor/sensory deficits   LABORATORY DATA: Lab on 08/10/2013  Component Date Value Ref Range Status  . WBC 08/10/2013 2.4* 4.0 - 10.5 K/uL Final  . RBC 08/10/2013 4.55  4.22 - 5.81 MIL/uL Final  . Hemoglobin 08/10/2013 16.0  13.0 - 17.0 g/dL Final  . HCT 08/10/2013 43.4  39.0 - 52.0 % Final  .  MCV 08/10/2013 95.4  78.0 - 100.0 fL Final  . MCH 08/10/2013 35.2* 26.0 - 34.0 pg Final  . MCHC 08/10/2013 36.9* 30.0 - 36.0 g/dL Final  . RDW 08/10/2013 13.4  11.5  - 15.5 % Final  . Platelets 08/10/2013 62* 150 - 400 K/uL Final   Comment: SPECIMEN CHECKED FOR CLOTS                          CONSISTENT WITH PREVIOUS RESULT  . Neutrophils Relative % 08/10/2013 50  43 - 77 % Final  . Neutro Abs 08/10/2013 1.2* 1.7 - 7.7 K/uL Final  . Lymphocytes Relative 08/10/2013 38  12 - 46 % Final  . Lymphs Abs 08/10/2013 0.9  0.7 - 4.0 K/uL Final  . Monocytes Relative 08/10/2013 9  3 - 12 % Final  . Monocytes Absolute 08/10/2013 0.2  0.1 - 1.0 K/uL Final  . Eosinophils Relative 08/10/2013 3  0 - 5 % Final  . Eosinophils Absolute 08/10/2013 0.1  0.0 - 0.7 K/uL Final  . Basophils Relative 08/10/2013 0  0 - 1 % Final  . Basophils Absolute 08/10/2013 0.0  0.0 - 0.1 K/uL Final  Office Visit on 07/19/2013  Component Date Value Ref Range Status  . Phospholipids 07/19/2013 198  151 - 264 mg/dL Final   Comment: (NOTE)                          This test was performed using a kit that has not                          been cleared or approved by the FDA.  The                          analytical performance characteristics of this                          test have been determined by [facility], [place], [place].  This test should                          not be used for diagnosis without confirmation by                          other medically established means.                          Performed at [facility]  . WBC 07/19/2013 2.4* 4.0 - 10.5 K/uL Final  . RBC 07/19/2013 4.51  4.22 - 5.81 MIL/uL Final  . Hemoglobin 07/19/2013 15.7  13.0 - 17.0 g/dL Final  . HCT 07/19/2013 43.3  39.0 - 52.0 % Final  . MCV 07/19/2013 96.0  78.0 - 100.0 fL Final  . MCH 07/19/2013 34.8* 26.0 - 34.0 pg Final  . MCHC 07/19/2013 36.3* 30.0 - 36.0 g/dL Final  . RDW 07/19/2013 13.8  11.5 - 15.5 % Final  . Platelets 07/19/2013 60* 150 - 400 K/uL Final   SPECIMEN CHECKED FOR CLOTS  . Neutrophils Relative % 07/19/2013 59  43 -  77 % Final    . Lymphocytes Relative 07/19/2013 29  12 - 46 % Final  . Monocytes Relative 07/19/2013 10  3 - 12 % Final  . Eosinophils Relative 07/19/2013 2  0 - 5 % Final  . Basophils Relative 07/19/2013 0  0 - 1 % Final  . Neutro Abs 07/19/2013 1.5* 1.7 - 7.7 K/uL Final  . Lymphs Abs 07/19/2013 0.7  0.7 - 4.0 K/uL Final  . Monocytes Absolute 07/19/2013 0.2  0.1 - 1.0 K/uL Final  . Eosinophils Absolute 07/19/2013 0.0  0.0 - 0.7 K/uL Final  . Basophils Absolute 07/19/2013 0.0  0.0 - 0.1 K/uL Final  . RBC Morphology 07/19/2013 POLYCHROMASIA PRESENT   Final  . WBC Morphology 07/19/2013 ATYPICAL LYMPHOCYTES   Final  . Smear Review 07/19/2013 PLATELET COUNT CONFIRMED BY SMEAR   Final   Comment: PLATELETS APPEAR DECREASED                          LARGE PLATELETS PRESENT                          GIANT PLATELETS SEEN  . Retic Ct Pct 07/19/2013 3.5* 0.4 - 3.1 % Final  . RBC. 07/19/2013 4.51  4.22 - 5.81 MIL/uL Final  . Retic Count, Manual 07/19/2013 157.9  19.0 - 186.0 K/uL Final  . LDH 07/19/2013 189  94 - 250 U/L Final   SLIGHT HEMOLYSIS  . Sodium 07/19/2013 139  137 - 147 mEq/L Final  . Potassium 07/19/2013 4.5  3.7 - 5.3 mEq/L Final  . Chloride 07/19/2013 100  96 - 112 mEq/L Final  . CO2 07/19/2013 28  19 - 32 mEq/L Final  . Glucose, Bld 07/19/2013 287* 70 - 99 mg/dL Final  . BUN 07/19/2013 10  6 - 23 mg/dL Final  . Creatinine, Ser 07/19/2013 1.11  0.50 - 1.35 mg/dL Final  . Calcium 07/19/2013 9.9  8.4 - 10.5 mg/dL Final  . Total Protein 07/19/2013 6.9  6.0 - 8.3 g/dL Final  . Albumin 07/19/2013 3.6  3.5 - 5.2 g/dL Final  . AST 07/19/2013 35  0 - 37 U/L Final  . ALT 07/19/2013 33  0 - 53 U/L Final  . Alkaline Phosphatase 07/19/2013 58  39 - 117 U/L Final  . Total Bilirubin 07/19/2013 1.0  0.3 - 1.2 mg/dL Final  . GFR calc non Af Amer 07/19/2013 66* >90 mL/min Final  . GFR calc Af Amer 07/19/2013 77* >90 mL/min Final   Comment: (NOTE)                          The eGFR has been calculated using  the CKD EPI equation.                          This calculation has not been validated in all clinical situations.                          eGFR's persistently <90 mL/min signify possible Chronic Kidney                          Disease.  . Anion gap 07/19/2013 11  5 - 15 Final  . Vitamin B-12 07/19/2013 360  211 - 911 pg/mL Final   Performed at  [facility]  . Folate 07/19/2013 6.0   Final   Comment: (NOTE)                          Reference Ranges                                 Deficient:       0.4 - 3.3 ng/mL                                 Indeterminate:   3.4 - 5.4 ng/mL                                 Normal:              > 5.4 ng/mL                          Performed at [facility]  . ANA 07/19/2013 NEGATIVE  NEGATIVE Final   Performed at [facility]: No new pathology.  Urinalysis    Component Value Date/Time   COLORURINE YELLOW 03/28/2010 1427   APPEARANCEUR CLEAR 03/28/2010 1427   LABSPEC >1.030* 03/28/2010 1427   PHURINE 5.0 03/28/2010 1427   GLUCOSEU >1000* 03/28/2010 1427   HGBUR TRACE* 03/28/2010 1427   BILIRUBINUR SMALL* 03/28/2010 1427   KETONESUR TRACE* 03/28/2010 1427   PROTEINUR TRACE* 03/28/2010 1427   UROBILINOGEN 0.2 03/28/2010 1427   NITRITE NEGATIVE 03/28/2010 1427   LEUKOCYTESUR NEGATIVE 03/28/2010 1427    RADIOGRAPHIC STUDIES: US Abdomen Complete  07/25/2013   CLINICAL DATA:  Anemia.  EXAM: ULTRASOUND ABDOMEN COMPLETE  COMPARISON:  None.  FINDINGS: Gallbladder:  Cholecystectomy .  Common bile duct:  Diameter: 3.5 mm.  Liver:  Liver is nodular and heterogeneous suggesting hepatocellular disease.  IVC:  No abnormality visualized.  Pancreas:  Visualized portion unremarkable.  Spleen:  Spleen is enlarged at 17.6 cm.  Splenic volume is 1303 cm cubed.  Right Kidney:  Length: 11.9 cm.  Cortical thinning.  No mass or hydronephrosis.  Left Kidney:  Length: 11.7 cm.  Cortical thinning.  No mass hydronephrosis.  Abdominal aorta:  No  aneurysm visualized.  Other findings:  None.  IMPRESSION: 1. Splenomegaly. 2. Liver is nodular and heterogeneous suggesting hepatocellular disease.   Electronically Signed   By: Marcello Moores  Register   On: 07/25/2013 08:30    ASSESSMENT:  #1. 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 #2. Cirrhosis of the liver by ultrasound with splenomegaly probably due to fatty liver. #3. Diabetes mellitus, type II, non-insulin requiring, controlled. #4. Hypertension, controlled. #5. Diverticulosis of large intestine, status post partial colonic resection in the past due to severe diverticulitis requiring multiple units of red cell and platelet transfusion. #6. History of colonic polyps.   PLAN:  #1. Hepatitis workup was done today. #2. Recommend patient see his gastroenterologist for followup regarding etiology of his cirrhosis if hepatitis workup was negative. #3. No followup was scheduled in this office.   All questions were answered. The patient knows to call the clinic with any problems, questions or concerns. We can certainly see the patient much sooner if necessary.   I spent 25 minutes counseling the patient face to face. The total time  spent in the appointment was 30 minutes.    Doroteo Bradford, MD 08/10/2013 11:25 AM  DISCLAIMER:  This note was dictated with voice recognition software.  Similar sounding words can inadvertently be transcribed inaccurately and may not be corrected upon review.

## 2013-08-10 NOTE — Progress Notes (Signed)
LABS DRAWN FOR AHAV,HBCAB,HBEAB,HBSAB,HEPCA,CBCD

## 2013-08-14 LAB — HEPATITIS B E ANTIBODY: Hep B E Ab: NONREACTIVE

## 2013-08-17 ENCOUNTER — Other Ambulatory Visit (INDEPENDENT_AMBULATORY_CARE_PROVIDER_SITE_OTHER): Payer: Self-pay | Admitting: Internal Medicine

## 2013-08-17 ENCOUNTER — Ambulatory Visit (INDEPENDENT_AMBULATORY_CARE_PROVIDER_SITE_OTHER): Payer: Medicare Other | Admitting: Internal Medicine

## 2013-08-17 ENCOUNTER — Encounter (INDEPENDENT_AMBULATORY_CARE_PROVIDER_SITE_OTHER): Payer: Self-pay | Admitting: Internal Medicine

## 2013-08-17 VITALS — BP 138/76 | HR 72 | Temp 98.0°F | Ht 73.0 in | Wt 211.4 lb

## 2013-08-17 DIAGNOSIS — K7460 Unspecified cirrhosis of liver: Secondary | ICD-10-CM | POA: Insufficient documentation

## 2013-08-17 NOTE — Progress Notes (Signed)
Subjective:     Patient ID: [name], male   DOB: 1945/11/18, 68 y.o.   MRN: [id number]  HPI Patient is a self referral for his liver disease.  Has recently seen Dr. Barnet Glasgow for thrombocytopenia. Appetite has been good. No weight loss unintentional. No abdominal pain. No abdominal distention.  BMs are normal. Usually has a BM once a day.  Denies prior hx of ever being told he had cirrhosis.  No prior hx of jaundice.  On average he drinks 2 bourbons a day. He does not drink after he eats dinner. If he drinks beer, he only has a couple of beers.   07/25/2013 US abdomen:  None.  IMPRESSION:  1. Splenomegaly.  2. Liver is nodular and heterogeneous suggesting hepatocellular  Disease.  06/01/2012 Colonoscopy: Procedure: Colonoscopy  Indications: Patient is 68 year old Caucasian male with history of colonic adenomas and colon carcinoma in 2 second-degree relatives. He has had multiple colonic adenomas removed on 2 prior colonoscopies. The last one was in August 2010.  Prep satisfactory.  2 small polyps ablated via cold biopsy. These are located at cecum and sigmoid colon and these are submitted together.  Few small diverticula noted at ascending colon.  Wide-open colonic anastomosis located 25 cm from the anal margin.  Normal rectal mucosa.  Moderate-sized hemorrhoids noted below the dentate line.       CBC    Component Value Date/Time   WBC 2.4* 08/10/2013 1052   RBC 4.55 08/10/2013 1052   RBC 4.51 07/19/2013 1530   HGB 16.0 08/10/2013 1052   HCT 43.4 08/10/2013 1052   PLT 62* 08/10/2013 1052   MCV 95.4 08/10/2013 1052   MCH 35.2* 08/10/2013 1052   MCHC 36.9* 08/10/2013 1052   RDW 13.4 08/10/2013 1052   LYMPHSABS 0.9 08/10/2013 1052   MONOABS 0.2 08/10/2013 1052   EOSABS 0.1 08/10/2013 1052   BASOSABS 0.0 08/10/2013 1052    CMP     Component Value Date/Time   NA 139 07/19/2013 1530   K 4.5 07/19/2013 1530   CL 100 07/19/2013 1530   CO2 28 07/19/2013 1530   GLUCOSE 287* 07/19/2013 1530    BUN 10 07/19/2013 1530   CREATININE 1.11 07/19/2013 1530   CALCIUM 9.9 07/19/2013 1530   PROT 6.9 07/19/2013 1530   ALBUMIN 3.6 07/19/2013 1530   AST 35 07/19/2013 1530   ALT 33 07/19/2013 1530   ALKPHOS 58 07/19/2013 1530   BILITOT 1.0 07/19/2013 1530   GFRNONAA 66* 07/19/2013 1530   GFRAA 77* 07/19/2013 1530    07/19/2013 Vitamin B12 360, ANA negative, Folate 6.10, Hepa A,B,C antibody negative.  Review of Systems Past Medical History  Diagnosis Date  . Hypertension   . Diabetes mellitus     x 30 yrs  . Colitis     Mucous colitis  . High triglycerides   . Peripheral neuropathy   . Femoral deep venous thrombosis (DVT) 2009    both upper legs    Past Surgical History  Procedure Laterality Date  . Cholecystectomy    . Colonoscopy N/A 06/01/2012    Procedure: COLONOSCOPY;  Surgeon: Rogene Houston, MD;  Location: [facility];  Service: Endoscopy;  Laterality: N/A;  930-moved to 1025 Ann to notify pt  . Colon surgery      for diverticulitis. Removed 13 inches were removed    Allergies  Allergen Reactions  . Metronidazole     REACTION: causes nausea and vomiting    Current Outpatient Prescriptions on  File Prior to Visit  Medication Sig Dispense Refill  . acetaminophen (TYLENOL) 500 MG tablet Take 1,000 mg by mouth every 6 (six) hours as needed for pain (headache).      . ALPRAZolam (XANAX) 0.5 MG tablet Take 0.5 mg by mouth 3 (three) times daily as needed for anxiety.      . Choline Fenofibrate (FENOFIBRIC ACID) 45 MG CPDR Take 1 capsule by mouth daily.      Marland Kitchen Desvenlafaxine Succinate (PRISTIQ PO) Take 1 tablet by mouth daily.      Marland Kitchen FIBER PO Take 1 capsule by mouth as needed.      Marland Kitchen glipiZIDE (GLUCOTROL) 5 MG tablet Take 2.5 mg by mouth daily.      Marland Kitchen lisinopril (PRINIVIL,ZESTRIL) 20 MG tablet Take 20 mg by mouth every evening.       . metFORMIN (GLUCOPHAGE) 500 MG tablet Take by mouth 2 (two) times daily with a meal.      . naproxen sodium (ALEVE) 220 MG tablet Take 220 mg  by mouth 2 (two) times daily as needed (headache/pain).      . Omega 3 1000 MG CAPS Take 2 capsules by mouth 2 (two) times daily.      . pravastatin (PRAVACHOL) 40 MG tablet Take 20 mg by mouth every evening.       . pregabalin (LYRICA) 100 MG capsule Take 100 mg by mouth 2 (two) times daily.       Marland Kitchen zolpidem (AMBIEN) 10 MG tablet Take 10 mg by mouth at bedtime as needed for sleep.       No current facility-administered medications on file prior to visit.        Objective:   Physical Exam  Filed Vitals:   08/17/13 1530  BP: 138/76  Pulse: 72  Temp: 98 F (36.7 C)  Height: 6\' 1"  (1.854 m)  Weight: 211 lb 6.4 oz (95.89 kg)   Alert and oriented. Skin warm and dry. Oral mucosa is moist.   . Sclera anicteric, conjunctivae is pink. Thyroid not enlarged. No cervical lymphadenopathy. Lungs clear. Heart regular rate and rhythm.  Abdomen is soft. Bowel sounds are positive. No hepatomegaly. Patient does have splenomegaly. No abdominal masses felt. No tenderness.  No edema to lower extremities.       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 Needs to rule out other causes.  Liver enzymes are normal.  Thrombocytopenia related to his liver disease. Platelet have been low at least since 2008.  Leukopenia at least since 2008    Plan:     Sedrate, Ferritin, AFP, PT/INR, Alpha 1 antitrypsin, Ceruplasmin, SMA. Further recommendations to follow.

## 2013-08-17 NOTE — Patient Instructions (Signed)
Labs, OV in 4 months

## 2013-08-18 LAB — PROTIME-INR
INR: 1.13 (ref <1.50)
PROTHROMBIN TIME: 14.5 s (ref 11.6–15.2)

## 2013-08-18 LAB — SEDIMENTATION RATE: Sed Rate: 4 mm/hr (ref 0–16)

## 2013-08-18 LAB — FERRITIN: FERRITIN: 260 ng/mL (ref 22–322)

## 2013-08-18 LAB — AFP TUMOR MARKER: AFP-Tumor Marker: 3.8 ng/mL (ref 0.0–8.0)

## 2013-08-18 LAB — ANTI-SMOOTH MUSCLE ANTIBODY, IGG: Smooth Muscle Ab: 18 U (ref <20)

## 2013-08-21 ENCOUNTER — Other Ambulatory Visit (INDEPENDENT_AMBULATORY_CARE_PROVIDER_SITE_OTHER): Payer: Self-pay | Admitting: Internal Medicine

## 2013-08-21 LAB — ALPHA-1-ANTITRYPSIN: A-1 Antitrypsin, Ser: 147 mg/dL (ref 83–199)

## 2013-08-21 LAB — CERULOPLASMIN: Ceruloplasmin: 22 mg/dL (ref 18–36)

## 2013-08-23 NOTE — Progress Notes (Signed)
Apt has been scheduled for 12/20/13 with Garlan Fair, NP.

## 2013-12-20 ENCOUNTER — Ambulatory Visit (INDEPENDENT_AMBULATORY_CARE_PROVIDER_SITE_OTHER): Payer: Medicare Other | Admitting: Internal Medicine

## 2014-04-24 DIAGNOSIS — E1165 Type 2 diabetes mellitus with hyperglycemia: Secondary | ICD-10-CM | POA: Diagnosis not present

## 2014-04-26 DIAGNOSIS — E119 Type 2 diabetes mellitus without complications: Secondary | ICD-10-CM | POA: Diagnosis not present

## 2014-04-26 DIAGNOSIS — K7581 Nonalcoholic steatohepatitis (NASH): Secondary | ICD-10-CM | POA: Diagnosis not present

## 2014-04-26 DIAGNOSIS — I10 Essential (primary) hypertension: Secondary | ICD-10-CM | POA: Diagnosis not present

## 2014-04-26 DIAGNOSIS — Z6827 Body mass index (BMI) 27.0-27.9, adult: Secondary | ICD-10-CM | POA: Diagnosis not present

## 2014-08-10 ENCOUNTER — Emergency Department (HOSPITAL_COMMUNITY): Payer: Medicare Other

## 2014-08-10 ENCOUNTER — Encounter (HOSPITAL_COMMUNITY): Payer: Self-pay | Admitting: Emergency Medicine

## 2014-08-10 ENCOUNTER — Emergency Department (HOSPITAL_COMMUNITY)
Admission: EM | Admit: 2014-08-10 | Discharge: 2014-08-10 | Disposition: A | Discharge status: Home / Self Care | Payer: Medicare Other | Attending: Emergency Medicine | Admitting: Emergency Medicine

## 2014-08-10 DIAGNOSIS — Z8719 Personal history of other diseases of the digestive system: Secondary | ICD-10-CM | POA: Insufficient documentation

## 2014-08-10 DIAGNOSIS — M25512 Pain in left shoulder: Secondary | ICD-10-CM | POA: Diagnosis not present

## 2014-08-10 DIAGNOSIS — Y998 Other external cause status: Secondary | ICD-10-CM | POA: Diagnosis not present

## 2014-08-10 DIAGNOSIS — S4992XA Unspecified injury of left shoulder and upper arm, initial encounter: Secondary | ICD-10-CM | POA: Diagnosis present

## 2014-08-10 DIAGNOSIS — S299XXA Unspecified injury of thorax, initial encounter: Secondary | ICD-10-CM | POA: Insufficient documentation

## 2014-08-10 DIAGNOSIS — Z79899 Other long term (current) drug therapy: Secondary | ICD-10-CM | POA: Insufficient documentation

## 2014-08-10 DIAGNOSIS — Y9389 Activity, other specified: Secondary | ICD-10-CM | POA: Diagnosis not present

## 2014-08-10 DIAGNOSIS — Z720 Tobacco use: Secondary | ICD-10-CM | POA: Diagnosis not present

## 2014-08-10 DIAGNOSIS — Y92000 Kitchen of unspecified non-institutional (private) residence as  the place of occurrence of the external cause: Secondary | ICD-10-CM | POA: Insufficient documentation

## 2014-08-10 DIAGNOSIS — S42212A Unspecified displaced fracture of surgical neck of left humerus, initial encounter for closed fracture: Secondary | ICD-10-CM | POA: Insufficient documentation

## 2014-08-10 DIAGNOSIS — W010XXA Fall on same level from slipping, tripping and stumbling without subsequent striking against object, initial encounter: Secondary | ICD-10-CM | POA: Diagnosis not present

## 2014-08-10 DIAGNOSIS — I10 Essential (primary) hypertension: Secondary | ICD-10-CM | POA: Diagnosis not present

## 2014-08-10 DIAGNOSIS — E119 Type 2 diabetes mellitus without complications: Secondary | ICD-10-CM | POA: Insufficient documentation

## 2014-08-10 DIAGNOSIS — Z86718 Personal history of other venous thrombosis and embolism: Secondary | ICD-10-CM | POA: Diagnosis not present

## 2014-08-10 DIAGNOSIS — G629 Polyneuropathy, unspecified: Secondary | ICD-10-CM | POA: Insufficient documentation

## 2014-08-10 DIAGNOSIS — R079 Chest pain, unspecified: Secondary | ICD-10-CM | POA: Diagnosis not present

## 2014-08-10 DIAGNOSIS — S42302A Unspecified fracture of shaft of humerus, left arm, initial encounter for closed fracture: Secondary | ICD-10-CM

## 2014-08-10 DIAGNOSIS — S42252A Displaced fracture of greater tuberosity of left humerus, initial encounter for closed fracture: Secondary | ICD-10-CM | POA: Diagnosis not present

## 2014-08-10 LAB — CBG MONITORING, ED: GLUCOSE-CAPILLARY: 159 mg/dL — AB (ref 65–99)

## 2014-08-10 MED ORDER — OXYCODONE-ACETAMINOPHEN 5-325 MG PO TABS
2.0000 | ORAL_TABLET | Freq: Once | ORAL | Status: AC
  Administered 2014-08-10: 2 via ORAL
  Filled 2014-08-10: qty 2

## 2014-08-10 MED ORDER — OXYCODONE-ACETAMINOPHEN 5-325 MG PO TABS: 2.0000 | ORAL_TABLET | ORAL | Status: DC | PRN

## 2014-08-10 NOTE — ED Provider Notes (Signed)
CSN: [id number]     Arrival date & time 08/10/14  1412 History   First MD Initiated Contact with Patient 08/10/14 1456     Chief Complaint  Patient presents with  . Arm Injury      HPI  Patient presents for evaluation of left shoulder pain after a fall last night at his home. He complains of some water on his kitchen floor last night. He slipped and fell against an adducted left arm. He complains of pain and swelling of the left proximal humerus. Not able move the shoulder without a great of pain. Pain in the chest. Does not feel short of breath.  No strike to the head. No loss of consciousness. No midline neck or back pain.  Past Medical History  Diagnosis Date  . Hypertension   . Diabetes mellitus     x 30 yrs  . Colitis     Mucous colitis  . High triglycerides   . Peripheral neuropathy   . 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 Cholecystectomy    . Colonoscopy N/A 06/01/2012    Procedure: COLONOSCOPY;  Surgeon: Rogene Houston, MD;  Location: [facility];  Service: Endoscopy;  Laterality: N/A;  930-moved to 1025 Ann to notify pt  . Colon surgery      for diverticulitis. Removed 13 inches were removed   History reviewed. No pertinent family history. History  Substance Use Topics  . Smoking status: Current Every Day Smoker -- 1.00 packs/day    Types: Cigarettes  . Smokeless tobacco: Never Used     Comment: 1/2 pack a day.  . Alcohol Use: Yes     Comment: drink occasionally. drinks less than a 6 pack a day. He drinks bourbon and wine.  He does not drink every day    Review of Systems  Constitutional: Negative for fever, chills, diaphoresis, appetite change and fatigue.  HENT: Negative for mouth sores, sore throat and trouble swallowing.   Eyes: Negative for visual disturbance.  Respiratory: Negative for cough, chest tightness, shortness of breath and wheezing.   Cardiovascular: Positive for  chest pain.  Gastrointestinal: Negative for nausea, vomiting, abdominal pain, diarrhea and abdominal distention.  Endocrine: Negative for polydipsia, polyphagia and polyuria.  Genitourinary: Negative for dysuria, frequency and hematuria.  Musculoskeletal: Negative for gait problem.       Lt shoulder pain.  Skin: Negative for color change, pallor and rash.  Neurological: Negative for dizziness, syncope, light-headedness and headaches.  Hematological: Does not bruise/bleed easily.  Psychiatric/Behavioral: Negative for behavioral problems and confusion.      Allergies  Metronidazole  Home Medications   Prior to Admission medications   Medication Sig Start Date End Date Taking? Authorizing Provider  acetaminophen (TYLENOL) 500 MG tablet Take 1,000 mg by mouth every 6 (six) hours as needed for pain (headache).    Historical Provider, MD  ALPRAZolam Duanne Moron) 0.5 MG tablet Take 0.5 mg by mouth 3 (three) times daily as needed for anxiety.    Historical Provider, MD  Choline Fenofibrate (FENOFIBRIC ACID) 45 MG CPDR Take 1 capsule by mouth daily.    Historical Provider, MD  Desvenlafaxine Succinate (PRISTIQ PO) Take 1 tablet by mouth daily.    Historical Provider, MD  FIBER PO Take 1 capsule by mouth as needed.    Historical Provider, MD  glipiZIDE (GLUCOTROL) 5 MG tablet Take 2.5 mg by mouth daily.  Historical Provider, MD  lisinopril (PRINIVIL,ZESTRIL) 20 MG tablet Take 20 mg by mouth every evening.     Historical Provider, MD  metFORMIN (GLUCOPHAGE) 500 MG tablet Take by mouth 2 (two) times daily with a meal.    Historical Provider, MD  naproxen sodium (ALEVE) 220 MG tablet Take 220 mg by mouth 2 (two) times daily as needed (headache/pain).    Historical Provider, MD  Omega 3 1000 MG CAPS Take 2 capsules by mouth 2 (two) times daily.    Historical Provider, MD  oxyCODONE-acetaminophen (PERCOCET/ROXICET) 5-325 MG per tablet Take 2 tablets by mouth every 4 (four) hours as needed. 08/10/14   Tanna Furry, MD  pravastatin (PRAVACHOL) 40 MG tablet Take 20 mg by mouth every evening.     Historical Provider, MD  pregabalin (LYRICA) 100 MG capsule Take 100 mg by mouth 2 (two) times daily.     Historical Provider, MD  zolpidem (AMBIEN) 10 MG tablet Take 10 mg by mouth at bedtime as needed for sleep.    Historical Provider, MD   BP 144/78 mmHg  Pulse 91  Temp(Src) 97.9 F (36.6 C) (Oral)  Resp 18  Ht 6\' 2"  (1.88 m)  Wt 200 lb (90.719 kg)  BMI 25.67 kg/m2  SpO2 98% Physical Exam  Constitutional: He is oriented to person, place, and time. He appears well-developed and well-nourished. No distress.  HENT:  Head: Normocephalic.  Eyes: Conjunctivae are normal. Pupils are equal, round, and reactive to light. No scleral icterus.  Neck: Normal range of motion. Neck supple. No thyromegaly present.  Cardiovascular: Normal rate and regular rhythm.  Exam reveals no gallop and no friction rub.   No murmur heard. Pulmonary/Chest: Effort normal and breath sounds normal. No respiratory distress. He has no wheezes. He has no rales.    Abdominal: Soft. Bowel sounds are normal. He exhibits no distension. There is no tenderness. There is no rebound.  Musculoskeletal: Normal range of motion.       Arms: Neurological: He is alert and oriented to person, place, and time.  Skin: Skin is warm and dry. No rash noted.  Psychiatric: He has a normal mood and affect. His behavior is normal.    ED Course  Procedures (including critical care time) Labs Review Labs Reviewed  CBG MONITORING, ED - Abnormal; Notable for the following:    Glucose-Capillary 159 (*)    All other components within normal limits    Imaging Review Dg Chest 2 View  08/10/2014   CLINICAL DATA:  Pain following fall  EXAM: CHEST  2 VIEW  COMPARISON:  March 28, 2010  FINDINGS: There is no edema or consolidation. The heart size and pulmonary vascularity are normal. No adenopathy. No pneumothorax.  There is a fracture of the proximal  humeral metaphysis on the left. There is evidence of old trauma involving the right clavicle. There old healed rib fractures on the right as well.  IMPRESSION: Acute fracture proximal left humeral metaphysis. Old healed rib fractures on the right. Old healed fracture right clavicle. No lung edema or consolidation. No pneumothorax.   Electronically Signed   By: Lowella Grip III M.D.   On: 08/10/2014 15:11   Dg Shoulder Left  08/10/2014   CLINICAL DATA:  Fall at home last night. Severe left shoulder pain and bruising. Swelling.  EXAM: LEFT SHOULDER - 2+ VIEW  COMPARISON:  None.  FINDINGS: A comminuted proximal left humerus fracture is present. The glenohumeral joint is intact. The [facility] joint appears intact.  The clavicle is within normal limits. The visualized hemi thorax is clear.  IMPRESSION: Comminuted proximal humerus fracture at the level of the surgical neck and involving the greater tubercle   Electronically Signed   By: San Morelle M.D.   On: 08/10/2014 15:11     EKG Interpretation None      MDM   Final diagnoses:  Humerus fracture, left, closed, initial encounter        Tanna Furry, MD 08/10/14 1540

## 2014-08-10 NOTE — Discharge Instructions (Signed)
Humerus Fracture, Treated with Immobilization The humerus is the large bone in your upper arm. You have a broken (fractured) humerus. These fractures are easily diagnosed with X-rays. TREATMENT  Simple fractures which will heal without disability are treated with simple immobilization. Immobilization means you will wear a cast, splint, or sling. You have a fracture which will do well with immobilization. The fracture will heal well simply by being held in a good position until it is stable enough to begin range of motion exercises. Do not take part in activities which would further injure your arm.  HOME CARE INSTRUCTIONS   Put ice on the injured area.  Put ice in a plastic bag.  Place a towel between your skin and the bag.  Leave the ice on for 15-20 minutes, 03-04 times a day.  If you have a sling:  Wear the sling as directed.  Only take over-the-counter or prescription medicines for pain, discomfort, or fever as directed by your caregiver.  Do range of motion exercises as instructed by your caregiver.  Follow up as directed by your caregiver. This is very important in order to avoid permanent injury or disability and chronic pain. SEEK IMMEDIATE MEDICAL CARE IF:   Your skin or nails in the injured arm turn blue or gray.  Your arm feels cold or numb.  You develop severe pain in the injured arm.  You are having problems with the medicines you were given. MAKE SURE YOU:   Understand these instructions.  Will watch your condition.  Will get help right away if you are not doing well or get worse. Document Released: 03/30/2000 Document Revised: 03/16/2011 Document Reviewed: 02/05/2010 [facility] Patient Information 2015 [place], [place]. This information is not intended to replace advice given to you by your health care provider. Make sure you discuss any questions you have with your health care provider.

## 2014-08-10 NOTE — ED Notes (Signed)
Pt states he slipped in some water last night and is having left shoulder and elbow pain.  Deformity to left shoulder.

## 2014-08-12 ENCOUNTER — Encounter (HOSPITAL_COMMUNITY): Payer: Self-pay | Admitting: Emergency Medicine

## 2014-08-12 ENCOUNTER — Emergency Department (HOSPITAL_COMMUNITY)
Admission: EM | Admit: 2014-08-12 | Discharge: 2014-08-12 | Disposition: A | Discharge status: Home / Self Care | Payer: Medicare Other | Attending: Emergency Medicine | Admitting: Emergency Medicine

## 2014-08-12 DIAGNOSIS — Z79899 Other long term (current) drug therapy: Secondary | ICD-10-CM | POA: Insufficient documentation

## 2014-08-12 DIAGNOSIS — E781 Pure hyperglyceridemia: Secondary | ICD-10-CM | POA: Insufficient documentation

## 2014-08-12 DIAGNOSIS — Z8719 Personal history of other diseases of the digestive system: Secondary | ICD-10-CM | POA: Insufficient documentation

## 2014-08-12 DIAGNOSIS — I10 Essential (primary) hypertension: Secondary | ICD-10-CM | POA: Insufficient documentation

## 2014-08-12 DIAGNOSIS — Z720 Tobacco use: Secondary | ICD-10-CM | POA: Diagnosis not present

## 2014-08-12 DIAGNOSIS — Z86718 Personal history of other venous thrombosis and embolism: Secondary | ICD-10-CM | POA: Insufficient documentation

## 2014-08-12 DIAGNOSIS — E119 Type 2 diabetes mellitus without complications: Secondary | ICD-10-CM | POA: Diagnosis not present

## 2014-08-12 DIAGNOSIS — G629 Polyneuropathy, unspecified: Secondary | ICD-10-CM | POA: Diagnosis not present

## 2014-08-12 DIAGNOSIS — L03114 Cellulitis of left upper limb: Secondary | ICD-10-CM | POA: Diagnosis not present

## 2014-08-12 DIAGNOSIS — Z4800 Encounter for change or removal of nonsurgical wound dressing: Secondary | ICD-10-CM | POA: Diagnosis present

## 2014-08-12 DIAGNOSIS — L03119 Cellulitis of unspecified part of limb: Secondary | ICD-10-CM

## 2014-08-12 LAB — CBG MONITORING, ED: Glucose-Capillary: 145 mg/dL — ABNORMAL HIGH (ref 65–99)

## 2014-08-12 MED ORDER — BACITRACIN ZINC 500 UNIT/GM EX OINT: 1.0000 "application " | TOPICAL_OINTMENT | Freq: Two times a day (BID) | CUTANEOUS | Status: DC

## 2014-08-12 MED ORDER — BACITRACIN ZINC 500 UNIT/GM EX OINT
TOPICAL_OINTMENT | Freq: Once | CUTANEOUS | Status: AC
  Administered 2014-08-12: 10:00:00 via TOPICAL
  Filled 2014-08-12: qty 0.9

## 2014-08-12 NOTE — Discharge Instructions (Signed)
Read the information below.  You may return to the Emergency Department at any time for worsening condition or any new symptoms that concern you.  If you develop increased redness, swelling, pus draining from the wound, or fevers greater than 100.4, return to the ER immediately for a recheck.     Cellulitis Cellulitis is an infection of the skin and the tissue under the skin. The infected area is usually red and tender. This happens most often in the arms and lower legs. HOME CARE   Take your antibiotic medicine as told. Finish the medicine even if you start to feel better.  Keep the infected arm or leg raised (elevated).  Put a warm cloth on the area up to 4 times per day.  Only take medicines as told by your doctor.  Keep all doctor visits as told. GET HELP IF:  You see red streaks on the skin coming from the infected area.  Your red area gets bigger or turns a dark color.  Your bone or joint under the infected area is painful after the skin heals.  Your infection comes back in the same area or different area.  You have a puffy (swollen) bump in the infected area.  You have new symptoms.  You have a fever. GET HELP RIGHT AWAY IF:   You feel very sleepy.  You throw up (vomit) or have watery poop (diarrhea).  You feel sick and have muscle aches and pains. MAKE SURE YOU:   Understand these instructions.  Will watch your condition.  Will get help right away if you are not doing well or get worse. Document Released: 06/10/2007 Document Revised: 05/08/2013 Document Reviewed: 03/09/2011 [facility] Patient Information 2015 [place], [place]. This information is not intended to replace advice given to you by your health care provider. Make sure you discuss any questions you have with your health care provider.

## 2014-08-12 NOTE — ED Notes (Addendum)
Patient was here in ER on 8/5 after falling, patient fractured left shoulder and had abrasions to left elbow. Per patient abrasions irritated by sling and wants dressing for elbow. Elbow swollen, red, and tender to touch. Patient is diabetic. Per patient last Tdap was 20 years ago.

## 2014-08-12 NOTE — ED Provider Notes (Signed)
CSN: [id number]     Arrival date & time 08/12/14  4008 History   First MD Initiated Contact with Patient 08/12/14 [phone]     Chief Complaint  Patient presents with  . Wound Check     (Consider location/radiation/quality/duration/timing/severity/associated sxs/prior Treatment) The history is provided by the patient and medical records.     Pt with hx DM p/w left elbow irritation and pain.  Pt was seen two days ago after mechanical slip and fall, causing proximal humerus fracture of left arm.  He has two abrasions on his left elbow that have been irritated by the sling.  Denies any other change in his condition.  Denies headache, weakness or numbness of the extremities, vomiting, chest pain, SOB, cough.  States he has been taking his medication as directed and plans to follow up with the orthopedist as directed.  States this morning he reached into his sling and felt that he had blood coming from his elbow.  Notes the skin is sore but denies any deeper pain or concern for other injury.  Denies possibility of broken bones, notes he has full AROM of left elbow without pain.   Past Medical History  Diagnosis Date  . Hypertension   . Diabetes mellitus     x 30 yrs  . Colitis     Mucous colitis  . High triglycerides   . Peripheral neuropathy   . 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 Cholecystectomy    . Colonoscopy N/A 06/01/2012    Procedure: COLONOSCOPY;  Surgeon: Rogene Houston, MD;  Location: [facility];  Service: Endoscopy;  Laterality: N/A;  930-moved to [address] to notify pt  . Colon surgery      for diverticulitis. Removed 13 inches were removed   History reviewed. No pertinent family history. History  Substance Use Topics  . Smoking status: Current Every Day Smoker -- 1.00 packs/day for 50 years    Types: Cigarettes  . Smokeless tobacco: Never Used     Comment: 1/2 pack a day.  . Alcohol Use: Yes      Comment: drink occasionally. drinks less than a 6 pack a day. He drinks bourbon and wine.  He does not drink every day    Review of Systems  Constitutional: Negative for fever and chills.  Respiratory: Negative for cough and shortness of breath.   Cardiovascular: Negative for chest pain.  Musculoskeletal: Positive for arthralgias. Negative for myalgias.  Skin: Positive for color change and wound.  Neurological: Negative for weakness and numbness.      Allergies  Metronidazole  Home Medications   Prior to Admission medications   Medication Sig Start Date End Date Taking? Authorizing Provider  acetaminophen (TYLENOL) 500 MG tablet Take 1,000 mg by mouth every 6 (six) hours as needed for pain (headache).    Historical Provider, MD  ALPRAZolam Duanne Moron) 0.5 MG tablet Take 0.5 mg by mouth 3 (three) times daily as needed for anxiety.    Historical Provider, MD  Choline Fenofibrate (FENOFIBRIC ACID) 45 MG CPDR Take 1 capsule by mouth daily.    Historical Provider, MD  Desvenlafaxine Succinate (PRISTIQ PO) Take 1 tablet by mouth daily.    Historical Provider, MD  FIBER PO Take 1 capsule by mouth as needed.    Historical Provider, MD  glipiZIDE (GLUCOTROL) 5 MG tablet Take 2.5 mg by mouth daily.    Historical Provider,  MD  lisinopril (PRINIVIL,ZESTRIL) 20 MG tablet Take 20 mg by mouth every evening.     Historical Provider, MD  metFORMIN (GLUCOPHAGE) 500 MG tablet Take by mouth 2 (two) times daily with a meal.    Historical Provider, MD  naproxen sodium (ALEVE) 220 MG tablet Take 220 mg by mouth 2 (two) times daily as needed (headache/pain).    Historical Provider, MD  Omega 3 1000 MG CAPS Take 2 capsules by mouth 2 (two) times daily.    Historical Provider, MD  oxyCODONE-acetaminophen (PERCOCET/ROXICET) 5-325 MG per tablet Take 2 tablets by mouth every 4 (four) hours as needed. 08/10/14   Tanna Furry, MD  pravastatin (PRAVACHOL) 40 MG tablet Take 20 mg by mouth every evening.     Historical  Provider, MD  pregabalin (LYRICA) 100 MG capsule Take 100 mg by mouth 2 (two) times daily.     Historical Provider, MD  zolpidem (AMBIEN) 10 MG tablet Take 10 mg by mouth at bedtime as needed for sleep.    Historical Provider, MD   BP 144/81 mmHg  Pulse 96  Temp(Src) 98.1 F (36.7 C) (Oral)  Resp 16  Ht 6\' 2"  (1.88 m)  Wt 200 lb (90.719 kg)  BMI 25.67 kg/m2  SpO2 98% Physical Exam  Constitutional: He appears well-developed and well-nourished. No distress.  HENT:  Head: Normocephalic and atraumatic.  Neck: Neck supple.  Cardiovascular: Normal rate, regular rhythm and intact distal pulses.   Pulmonary/Chest: Effort normal and breath sounds normal.  Musculoskeletal:  Left elbow, over proximal ulna with two scabbed abrasions with localized erythema.  No discharge.  NO induration or fluctuance.  Full range of motion of left elbow without pain.  Distal pulses and sensation intact.  Compartments are soft.  Ecchymosis of left upper arm and decreased ROM of left shoulder secondary to pain.   Neurological: He is alert.  Skin: He is not diaphoretic.  Nursing note and vitals reviewed.   ED Course  Procedures (including critical care time) Labs Review Labs Reviewed  CBG MONITORING, ED - Abnormal; Notable for the following:    Glucose-Capillary 145 (*)    All other components within normal limits    Imaging Review Dg Chest 2 View  08/10/2014   CLINICAL DATA:  Pain following fall  EXAM: CHEST  2 VIEW  COMPARISON:  March 28, 2010  FINDINGS: There is no edema or consolidation. The heart size and pulmonary vascularity are normal. No adenopathy. No pneumothorax.  There is a fracture of the proximal humeral metaphysis on the left. There is evidence of old trauma involving the right clavicle. There old healed rib fractures on the right as well.  IMPRESSION: Acute fracture proximal left humeral metaphysis. Old healed rib fractures on the right. Old healed fracture right clavicle. No lung edema or  consolidation. No pneumothorax.   Electronically Signed   By: Lowella Grip III M.D.   On: 08/10/2014 15:11   Dg Shoulder Left  08/10/2014   CLINICAL DATA:  Fall at home last night. Severe left shoulder pain and bruising. Swelling.  EXAM: LEFT SHOULDER - 2+ VIEW  COMPARISON:  None.  FINDINGS: A comminuted proximal left humerus fracture is present. The glenohumeral joint is intact. The [facility] joint appears intact. The clavicle is within normal limits. The visualized hemi thorax is clear.  IMPRESSION: Comminuted proximal humerus fracture at the level of the surgical neck and involving the greater tubercle   Electronically Signed   By: San Morelle M.D.   On:  08/10/2014 15:11     EKG Interpretation None      MDM   Final diagnoses:  Cellulitis of elbow   Afebrile, nontoxic patient with injury to left arm two days ago after mechanical fall.  Seen in ED at time dx proximal humerus fracture.  Pt has been doing well with exception of exacerbation of abrasions of left elbow due to sling use that now appear slightly infected.  No e/o abscess.  Doubt intraarticular infection or associated fracture.  Pt provided with wound care and padding to left elbow, antibiotic prescription.  D/C home with ortho follow up (as previously directed).  Discussed result, findings, treatment, and follow up  with patient.  Pt given return precautions.  Pt verbalizes understanding and agrees with plan.         Clayton Bibles, PA-C 08/12/14 [place], MD 08/12/14 1336

## 2014-08-13 ENCOUNTER — Ambulatory Visit (INDEPENDENT_AMBULATORY_CARE_PROVIDER_SITE_OTHER): Payer: Medicare Other | Admitting: Orthopedic Surgery

## 2014-08-13 VITALS — BP 119/72 | Ht 74.0 in | Wt 200.0 lb

## 2014-08-13 DIAGNOSIS — S42202A Unspecified fracture of upper end of left humerus, initial encounter for closed fracture: Secondary | ICD-10-CM

## 2014-08-13 MED ORDER — HYDROCODONE-ACETAMINOPHEN 7.5-325 MG PO TABS: 1.0000 | ORAL_TABLET | ORAL | Status: DC | PRN

## 2014-08-14 ENCOUNTER — Encounter: Payer: Self-pay | Admitting: Orthopedic Surgery

## 2014-08-14 NOTE — Progress Notes (Signed)
Patient ID: [name], male   DOB: [birth year], 69 y.o.   MRN: [id number]  Chief Complaint  Patient presents with  . Follow-up    Er follow up on left humerus fracture, DOI 08-09-14.    HPI [name] is a 69 y.o. male.  68 year old male with diabetes presents after falling on August 4 injured his left humerus. He complains of pain over the left shoulder 4 days associated with moderate to severe pain described as a dull ache and increased with any movement of his left arm.  Review of Systems Review of Systems 1. He does not complain of numbness 2. He does complain of ecchymosis and skin abrasion over the left elbow and ecchymosis in the left arm  Allergies  Allergen Reactions  . Metronidazole     REACTION: causes nausea and vomiting    Current Outpatient Prescriptions  Medication Sig Dispense Refill  . acetaminophen (TYLENOL) 500 MG tablet Take 1,000 mg by mouth every 6 (six) hours as needed for pain (headache).    . ALPRAZolam (XANAX) 0.5 MG tablet Take 0.5 mg by mouth 3 (three) times daily as needed for anxiety.    . bacitracin ointment Apply 1 application topically 2 (two) times daily. 120 g 0  . Choline Fenofibrate (FENOFIBRIC ACID) 45 MG CPDR Take 1 capsule by mouth daily.    Marland Kitchen FIBER PO Take 1 capsule by mouth as needed.    Marland Kitchen glipiZIDE (GLUCOTROL XL) 5 MG 24 hr tablet Take 5 mg by mouth at bedtime.    Marland Kitchen HYDROcodone-acetaminophen (NORCO) 7.5-325 MG per tablet Take 1 tablet by mouth every 4 (four) hours as needed for moderate pain. 60 tablet 0  . lisinopril (PRINIVIL,ZESTRIL) 20 MG tablet Take 20 mg by mouth every evening.     . metFORMIN (GLUCOPHAGE) 500 MG tablet Take by mouth 2 (two) times daily with a meal.    . naproxen sodium (ALEVE) 220 MG tablet Take 220 mg by mouth 2 (two) times daily as needed (headache/pain).    . Omega 3 1000 MG CAPS Take 2 capsules by mouth 2 (two) times daily.    Marland Kitchen oxyCODONE-acetaminophen (PERCOCET/ROXICET) 5-325 MG per tablet Take 2  tablets by mouth every 4 (four) hours as needed. 30 tablet 0  . pravastatin (PRAVACHOL) 20 MG tablet Take 20 mg by mouth at bedtime.    . pregabalin (LYRICA) 100 MG capsule Take 100 mg by mouth at bedtime.     Marland Kitchen PRISTIQ 50 MG 24 hr tablet Take 50 mg by mouth at bedtime.    . TRADJENTA 5 MG TABS tablet Take 5 mg by mouth at bedtime.    Marland Kitchen zolpidem (AMBIEN) 10 MG tablet Take 10 mg by mouth at bedtime as needed for sleep.     No current facility-administered medications for this visit.      Physical Exam Physical Exam Blood pressure 119/72, height 6\' 2"  (1.88 m), weight 200 lb (90.719 kg).  Gen. appearance medium build. Body mass index is 25.67 kg/(m^2).  The patient is alert and oriented person place and time  Exam of the left shoulder reveal severe ecchymosis near the lower part of the arm and upper part of the elbow there is an abrasion 2 over the posterior aspect of the proximal forearm. He can move his hand normally his elbow is painful to move as well as the shoulder. We did not really move the shoulder secondary to pain he had tenderness at the fracture site. There were 2  abrasions over the proximal forearm. Muscle tone was normal his motor function at the hand level was normal. Skin as described. Normal sensation over the left proximal humerus and distal upper extremity had good pulse normal perfusion and no lymphadenopathy.  Data Reviewed X-rays from the hospital show a nondisplaced by the near criteria proximal humerus fracture there is involvement of the greater tuberosity but is nondisplaced. The surgical neck is fractured as well nondisplaced.    Assessment    Encounter Diagnosis  Name Primary?  . Proximal humerus fracture, left, closed, initial encounter Yes        Plan    Recommend shoulder immobilizer for 2 weeks and x-ray left shoulder. He can then start physical therapy  Medication Norco 7.5 mg every 4 #60       Arther Abbott 08/14/2014, 7:49 AM

## 2014-08-15 ENCOUNTER — Telehealth: Payer: Self-pay | Admitting: Orthopedic Surgery

## 2014-08-15 NOTE — Telephone Encounter (Signed)
Call from patient, requests order for home health, for assistance with daily living, for which he states he his eligible per his insurance agent.  Please advise regarding order; patient requests [facility], which his agent advised should be a participating provider with.  Patient's ph# is [phone].

## 2014-08-16 NOTE — Telephone Encounter (Signed)
Can we do this or does this need to come from primary care, he is requesting an aide not nurse or therapy

## 2014-08-16 NOTE — Telephone Encounter (Signed)
Yes we can order 

## 2014-08-20 ENCOUNTER — Other Ambulatory Visit: Payer: Self-pay | Admitting: *Deleted

## 2014-08-20 DIAGNOSIS — S42202A Unspecified fracture of upper end of left humerus, initial encounter for closed fracture: Secondary | ICD-10-CM

## 2014-08-20 NOTE — Telephone Encounter (Signed)
Working on referral to home health, please address medication issue

## 2014-08-20 NOTE — Telephone Encounter (Signed)
The only thing you prescribed was oxycodone

## 2014-08-20 NOTE — Telephone Encounter (Signed)
AND THE MEDICINE IS????

## 2014-08-20 NOTE — Telephone Encounter (Signed)
Patient states has not heard anything back regarding this home health order ([facility]).  He states he is having difficulty with the medication; states having itching; dry heaves, and a temperature.Marland Kitchen

## 2014-08-21 NOTE — Telephone Encounter (Signed)
i apologize, you prescribed hydrocodone and a Dr Jeneen Rinks prescribed oxy

## 2014-08-21 NOTE — Telephone Encounter (Signed)
Plan    Recommend shoulder immobilizer for 2 weeks and x-ray left shoulder. He can then start physical therapy  Medication Norco 7.5 mg every 4 #60       Arther Abbott 08/14/2014, 7:49 AM  That's whats in my note are you sure oxy

## 2014-08-21 NOTE — Telephone Encounter (Signed)
Spoke with patient states he is not eating much, advised patient meds may make nauseous on empty stomach  Called [facility] to follow up on referral, states did not receive fax, re faxed referral, patient aware that we are working on referral

## 2014-08-26 DIAGNOSIS — E1142 Type 2 diabetes mellitus with diabetic polyneuropathy: Secondary | ICD-10-CM | POA: Diagnosis not present

## 2014-08-26 DIAGNOSIS — S42202D Unspecified fracture of upper end of left humerus, subsequent encounter for fracture with routine healing: Secondary | ICD-10-CM | POA: Diagnosis not present

## 2014-08-28 ENCOUNTER — Ambulatory Visit (INDEPENDENT_AMBULATORY_CARE_PROVIDER_SITE_OTHER): Payer: Self-pay | Admitting: Orthopedic Surgery

## 2014-08-28 ENCOUNTER — Ambulatory Visit (INDEPENDENT_AMBULATORY_CARE_PROVIDER_SITE_OTHER): Payer: Medicare Other

## 2014-08-28 ENCOUNTER — Encounter: Payer: Self-pay | Admitting: Orthopedic Surgery

## 2014-08-28 VITALS — BP 129/78 | Ht 74.0 in | Wt 200.0 lb

## 2014-08-28 DIAGNOSIS — S4292XD Fracture of left shoulder girdle, part unspecified, subsequent encounter for fracture with routine healing: Secondary | ICD-10-CM

## 2014-08-28 MED ORDER — HYDROCODONE-ACETAMINOPHEN 7.5-325 MG PO TABS: 1.0000 | ORAL_TABLET | Freq: Four times a day (QID) | ORAL | Status: DC | PRN

## 2014-08-28 NOTE — Patient Instructions (Signed)
CALL [facility]PT TO SCHEDULE THERAPY VISITS

## 2014-08-28 NOTE — Progress Notes (Signed)
Patient ID: [name], male   DOB: [birth year], 69 y.o.   MRN: [id number]  Follow up visit  Chief Complaint  Patient presents with  . Follow-up    2 week recheck on left shoulder fractue, /doi 08-20-14.    BP 129/78 mmHg  Ht 6\' 2"  (1.88 m)  Wt 200 lb (90.719 kg)  BMI 25.67 kg/m2  Encounter Diagnosis  Name Primary?  . Shoulder fracture, left, with routine healing, subsequent encounter Yes    Follow up today for x-rays left shoulder status post proximal humerus fracture treated with sling-and-swathe. Patient complains of discomfort in his right shoulder. Complains of some elbow pain as well.  His skin ecchymosis is resolving. He has some tenderness over the proximal humerus. His elbow flexion is painful for him and there is a large collection of blood in the subcutaneous tissue  X-rays show good alignment of the fracture  It's okay to start therapy on the shoulder.  Follow-up 6 weeks  Meds ordered this encounter  Medications  . HYDROcodone-acetaminophen (NORCO) 7.5-325 MG per tablet    Sig: Take 1 tablet by mouth every 6 (six) hours as needed for moderate pain.    Dispense:  56 tablet    Refill:  0

## 2014-08-30 DIAGNOSIS — S42202D Unspecified fracture of upper end of left humerus, subsequent encounter for fracture with routine healing: Secondary | ICD-10-CM | POA: Diagnosis not present

## 2014-08-30 DIAGNOSIS — E1142 Type 2 diabetes mellitus with diabetic polyneuropathy: Secondary | ICD-10-CM | POA: Diagnosis not present

## 2014-09-03 DIAGNOSIS — S42202D Unspecified fracture of upper end of left humerus, subsequent encounter for fracture with routine healing: Secondary | ICD-10-CM | POA: Diagnosis not present

## 2014-09-03 DIAGNOSIS — E1142 Type 2 diabetes mellitus with diabetic polyneuropathy: Secondary | ICD-10-CM | POA: Diagnosis not present

## 2014-09-05 ENCOUNTER — Encounter (HOSPITAL_COMMUNITY): Payer: Self-pay | Admitting: Occupational Therapy

## 2014-09-05 ENCOUNTER — Ambulatory Visit (HOSPITAL_COMMUNITY): Payer: Medicare Other | Attending: Orthopedic Surgery | Admitting: Occupational Therapy

## 2014-09-05 DIAGNOSIS — M629 Disorder of muscle, unspecified: Secondary | ICD-10-CM | POA: Diagnosis not present

## 2014-09-05 DIAGNOSIS — R29898 Other symptoms and signs involving the musculoskeletal system: Secondary | ICD-10-CM | POA: Diagnosis not present

## 2014-09-05 DIAGNOSIS — M25612 Stiffness of left shoulder, not elsewhere classified: Secondary | ICD-10-CM | POA: Insufficient documentation

## 2014-09-05 DIAGNOSIS — M25512 Pain in left shoulder: Secondary | ICD-10-CM | POA: Diagnosis not present

## 2014-09-05 DIAGNOSIS — X58XXXD Exposure to other specified factors, subsequent encounter: Secondary | ICD-10-CM | POA: Insufficient documentation

## 2014-09-05 DIAGNOSIS — M6289 Other specified disorders of muscle: Secondary | ICD-10-CM

## 2014-09-05 DIAGNOSIS — S4292XD Fracture of left shoulder girdle, part unspecified, subsequent encounter for fracture with routine healing: Secondary | ICD-10-CM | POA: Insufficient documentation

## 2014-09-05 NOTE — Therapy (Signed)
[place], [place], [address] Phone: [phone]   Fax:  [phone]  Occupational Therapy Evaluation  Patient Details  Name: [name] MRN: [id number] Date of Birth: July 05, 1945 Referring Provider:  Carole Civil, MD  Encounter Date: 09/05/2014      OT End of Session - 09/05/14 1645    Visit Number 1   Number of Visits 16   Date for OT Re-Evaluation 11/04/14  mini-reassess 10/04/2014   Authorization Type [employer]   Authorization Time Period before 10th visit   Authorization - Visit Number 1   Authorization - Number of Visits 10   OT Start Time 1520   OT Stop Time 1600   OT Time Calculation (min) 40 min   Activity Tolerance Patient tolerated treatment well   Behavior During Therapy [facility] for tasks assessed/performed      Past Medical History  Diagnosis Date  . Hypertension   . Diabetes mellitus     x 30 yrs  . Colitis     Mucous colitis  . High triglycerides   . Peripheral neuropathy   . Femoral deep venous thrombosis (DVT) 2009    both upper legs    Past Surgical History  Procedure Laterality Date  . Cholecystectomy    . Colonoscopy N/A 06/01/2012    Procedure: COLONOSCOPY;  Surgeon: Rogene Houston, MD;  Location: [facility];  Service: Endoscopy;  Laterality: N/A;  930-moved to [address] to notify pt  . Colon surgery      for diverticulitis. Removed 13 inches were removed    There were no vitals filed for this visit.  Visit Diagnosis:  Shoulder fracture, left, with routine healing, subsequent encounter  Pain in left shoulder  Tight fascia  Stiffness of left shoulder joint  Left arm weakness      Subjective Assessment - 09/05/14 1640    Subjective  S: I've come pretty far on my own.    Pertinent History Pt is a 69 y/o male s/p left shoulder fracture limiting his ability to complete B/IADL tasks. Pt reports he stopped wearing his sling due to discomfort  from heat and itching. Dr. Aline Brochure has referred pt to occupational therapy for evaluation and treatment.    Special Tests FOTO: 24/100 (76% impairment)   Patient Stated Goals To use my arm like I was   Currently in Pain? Yes   Pain Score 7    Pain Location Shoulder   Pain Orientation Left   Pain Descriptors / Indicators Throbbing   Pain Type Acute pain           [facility] OT Assessment - 09/05/14 1435    Assessment   Diagnosis Left shoulder fracture   Onset Date 08/06/14   Prior Therapy None   Precautions   Precautions None;Shoulder   Type of Shoulder Precautions AAROM and progress as tolerated   Balance Screen   Has the patient fallen in the past 6 months Yes   How many times? 1   Has the patient had a decrease in activity level because of a fear of falling?  No   Is the patient reluctant to leave their home because of a fear of falling?  No   Home  Environment   Family/patient expects to be discharged to: Private residence   Living Arrangements Alone   Type of [place] None   Prior Function   Level of Independence Independent with  basic ADLs   Vocation Retired   Leisure Reading, politics   ADL   ADL comments Pt has difficulty with dressing tasks, bathing tasks, reaching up into shelves, carrying weighted objects, completing household tasks.     ROM / Strength   AROM / PROM / Strength AROM;PROM;Strength   Palpation   Palpation comment Moderate fascial restrictions in left upper arm, deltoid, and scapularis regions.    AROM   AROM Assessment Site Shoulder   Right/Left Shoulder Left   Left Shoulder Flexion 30 Degrees   Left Shoulder ABduction 45 Degrees   Left Shoulder Internal Rotation 80 Degrees   Left Shoulder External Rotation 50 Degrees   PROM   PROM Assessment Site Shoulder   Right/Left Shoulder Left   Left Shoulder Flexion 96 Degrees   Left Shoulder ABduction 61 Degrees   Left Shoulder Internal Rotation 80 Degrees   Left Shoulder  External Rotation 51 Degrees   Strength   Overall Strength Comments Unable to test this date.    Strength Assessment Site Shoulder   Right/Left Shoulder Left                         OT Education - 09/05/14 1644    Education provided Yes   Education Details table slides   Person(s) Educated Patient   Methods Explanation;Demonstration;Handout   Comprehension Verbalized understanding;Returned demonstration          OT Short Term Goals - 09/05/14 1651    OT SHORT TERM GOAL #1   Title Pt will be educated on HEP.    Time 4   Period Weeks   Status New   OT SHORT TERM GOAL #2   Title Pt will decrease pain to 4/10 during daily tasks.    Time 4   Period Weeks   Status New   OT SHORT TERM GOAL #3   Title Pt will decrease fascial restrictions from mod to min amount or less.    Time 4   Period Weeks   Status New   OT SHORT TERM GOAL #4   Title Pt will increase AROM to [facility] to increase ability to donn shirts.    Time 4   Period Weeks   Status New   OT SHORT TERM GOAL #5   Title Pt will increase strength to 4/5 to increase ability to wash hair.    Time 4   Period Weeks   Status New           OT Long Term Goals - 09/05/14 1653    OT LONG TERM GOAL #1   Title Pt will return to prior level of function and independence in daily tasks.    Time 8   Period Weeks   Status New   OT LONG TERM GOAL #2   Title Pt will decrease pain to 2/10 or less during daily tasks.    Time 8   Period Weeks   Status New   OT LONG TERM GOAL #3   Title Pt will decrease fascial restrictions from min to trace amounts or less.    Time 8   Period Weeks   Status New   OT LONG TERM GOAL #4   Title Pt will increase AROM to WNL to increase ability to reach up into overhead cabinets.    Time 8   Period Weeks   Status New   OT LONG TERM GOAL #5   Title Pt will increase strength to 4+/5  to increase ability to wash dishes.    Time 8   Period Weeks   Status New                Plan - 2014/10/01 1646    Clinical Impression Statement A: Pt is a 69 y/o male with left shoulder fracture presenting with increased pain and fascial restrictions and decreased range of motion and strength, limiting his ability to complete B/IADL tasks. Pt has copay and has elected to come twice per week versus three times recommended by MD. Pt provided with table slides to complete at home, and educated on importance of wearing sling when out in the community for safety of the shoulder.    Pt will benefit from skilled therapeutic intervention in order to improve on the following deficits (Retired) Decreased strength;Pain;Impaired UE functional use;Increased fascial restricitons;Decreased range of motion;Impaired flexibility   Rehab Potential Good   OT Frequency 2x / week   OT Duration 8 weeks   OT Treatment/Interventions Self-care/ADL training;Passive range of motion;Patient/family education;Cryotherapy;Electrical Stimulation;Moist Heat;Therapeutic exercise;Manual Therapy;Therapeutic activities   Plan P: Pt would benefit from skilled occupational therapy services to increase range of motion and strength, decrease pain and fascial restrictions, and improve overall function of LUE. Treatment plan: myofascial release, manual therapy, PROM, AAROM, AROM, general LUE strengthening, proximal shoulder strengthening. Next session: ask about advanced directives.    OT Home Exercise Plan table slides   Consulted and Agree with Plan of Care Patient          G-Codes - 01-Oct-2014 1656    Functional Assessment Tool Used FOTO: 24/100 (76% impairment)   Functional Limitation Carrying, moving and handling objects   Carrying, Moving and Handling Objects Current Status (I6270) At least 60 percent but less than 80 percent impaired, limited or restricted   Carrying, Moving and Handling Objects Goal Status (J5009) At least 20 percent but less than 40 percent impaired, limited or restricted      Problem  List Patient Active Problem List   Diagnosis Date Noted  . 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  . Diverticulosis 07/19/2013  . DIARRHEA 08/22/2008  . COLITIS 08/21/2008  . DIVERTICULITIS, COLON 08/21/2008  . DM 08/20/2008  . HYPERLIPIDEMIA 08/20/2008  . SMOKER 08/20/2008  . HYPERTENSION 08/20/2008  . EXTERNAL HEMORRHOIDS 08/20/2008  . COLONIC POLYPS, ADENOMATOUS, HX OF 08/20/2008    Guadelupe Sabin, OTR/L  [phone]  10-01-2014, 4:58 PM  [facility] [address] [place], [place], [address] Phone: [phone]   Fax:  [phone]

## 2014-09-05 NOTE — Patient Instructions (Signed)
SHOULDER: Flexion On Table   Place hands on table, elbows straight. Move hips away from body. Press hands down into table.  _10-15__ reps per set, _2__ sets per day  Abduction (Passive)   With arm out to side, resting on table, lower head toward arm, keeping trunk away from table. Repeat __10-15__ times. Do __2__ sessions per day.  Copyright  VHI. All rights reserved.     Internal Rotation (Assistive)   Seated with elbow bent at right angle and held against side, slide arm on table surface in an inward arc. Repeat _10-15___ times. Do __2__ sessions per day. Activity: Use this motion to brush crumbs off the table.  Copyright  VHI. All rights reserved.    

## 2014-09-11 ENCOUNTER — Encounter (HOSPITAL_COMMUNITY): Payer: Medicare Other | Admitting: Occupational Therapy

## 2014-09-13 ENCOUNTER — Encounter (HOSPITAL_COMMUNITY): Payer: Medicare Other

## 2014-09-17 ENCOUNTER — Encounter (HOSPITAL_COMMUNITY): Payer: Medicare Other

## 2014-09-19 ENCOUNTER — Encounter (HOSPITAL_COMMUNITY): Payer: Medicare Other | Admitting: Occupational Therapy

## 2014-09-24 ENCOUNTER — Encounter (HOSPITAL_COMMUNITY): Payer: Medicare Other

## 2014-09-26 ENCOUNTER — Encounter (HOSPITAL_COMMUNITY): Payer: Medicare Other | Admitting: Occupational Therapy

## 2014-10-01 ENCOUNTER — Encounter (HOSPITAL_COMMUNITY): Payer: Medicare Other

## 2014-10-01 DIAGNOSIS — E119 Type 2 diabetes mellitus without complications: Secondary | ICD-10-CM | POA: Diagnosis not present

## 2014-10-01 DIAGNOSIS — I10 Essential (primary) hypertension: Secondary | ICD-10-CM | POA: Diagnosis not present

## 2014-10-03 ENCOUNTER — Encounter (HOSPITAL_COMMUNITY): Payer: Medicare Other | Admitting: Occupational Therapy

## 2014-10-03 DIAGNOSIS — G629 Polyneuropathy, unspecified: Secondary | ICD-10-CM | POA: Diagnosis not present

## 2014-10-03 DIAGNOSIS — I10 Essential (primary) hypertension: Secondary | ICD-10-CM | POA: Diagnosis not present

## 2014-10-03 DIAGNOSIS — Z23 Encounter for immunization: Secondary | ICD-10-CM | POA: Diagnosis not present

## 2014-10-03 DIAGNOSIS — E1142 Type 2 diabetes mellitus with diabetic polyneuropathy: Secondary | ICD-10-CM | POA: Diagnosis not present

## 2014-10-03 DIAGNOSIS — E782 Mixed hyperlipidemia: Secondary | ICD-10-CM | POA: Diagnosis not present

## 2014-10-08 ENCOUNTER — Encounter (HOSPITAL_COMMUNITY): Payer: Medicare Other

## 2014-10-09 ENCOUNTER — Ambulatory Visit: Payer: Medicare Other | Admitting: Orthopedic Surgery

## 2014-10-10 ENCOUNTER — Encounter (HOSPITAL_COMMUNITY): Payer: Medicare Other | Admitting: Occupational Therapy

## 2014-10-15 ENCOUNTER — Encounter (HOSPITAL_COMMUNITY): Payer: Medicare Other

## 2014-10-17 ENCOUNTER — Encounter (HOSPITAL_COMMUNITY): Payer: Medicare Other | Admitting: Occupational Therapy

## 2014-10-22 ENCOUNTER — Encounter (HOSPITAL_COMMUNITY): Payer: Medicare Other

## 2014-10-24 ENCOUNTER — Encounter (HOSPITAL_COMMUNITY): Payer: Medicare Other | Admitting: Occupational Therapy

## 2014-10-26 ENCOUNTER — Encounter (HOSPITAL_COMMUNITY): Payer: Self-pay

## 2014-10-26 NOTE — Therapy (Signed)
[place], [place], [address] Phone: [phone]   Fax:  [phone]  Patient Details  Name: [name] MRN: [id number] Date of Birth: 1945-07-05 Referring Provider:  No ref. provider found  Encounter Date: 10/26/2014 OCCUPATIONAL THERAPY DISCHARGE SUMMARY  Visits from Start of Care: 1  Current functional level related to goals / functional outcomes: OT LONG TERM GOAL #1    Title Pt will return to prior level of function and independence in daily tasks.    Time 8   Period Weeks   Status New   OT LONG TERM GOAL #2   Title Pt will decrease pain to 2/10 or less during daily tasks.    Time 8   Period Weeks   Status New   OT LONG TERM GOAL #3   Title Pt will decrease fascial restrictions from min to trace amounts or less.    Time 8   Period Weeks   Status New   OT LONG TERM GOAL #4   Title Pt will increase AROM to WNL to increase ability to reach up into overhead cabinets.    Time 8   Period Weeks   Status New   OT LONG TERM GOAL #5   Title Pt will increase strength to 4+/5 to increase ability to wash dishes.    Time 8   Period Weeks   Status New          Remaining deficits:Patient called and cancelled all appts. Stating that he was better and did not require any OT sessions. Patient only completed initial evaluation on 09/05/14.   Education / Equipment: Table slides Plan: Patient agrees to discharge.  Patient goals were not met. Patient is being discharged due to the patient's request.  ?????       Ailene Ravel, OTR/L,CBIS  [phone]  10/26/2014, 3:12 PM  [facility] [address] [place], [place], [address] Phone: [phone]   Fax:  [phone]

## 2014-10-29 ENCOUNTER — Encounter (HOSPITAL_COMMUNITY): Payer: Medicare Other

## 2014-10-31 ENCOUNTER — Encounter (HOSPITAL_COMMUNITY): Payer: Medicare Other | Admitting: Occupational Therapy

## 2015-01-08 DIAGNOSIS — J00 Acute nasopharyngitis [common cold]: Secondary | ICD-10-CM | POA: Diagnosis not present

## 2015-01-08 DIAGNOSIS — R05 Cough: Secondary | ICD-10-CM | POA: Diagnosis not present

## 2015-01-30 DIAGNOSIS — Z125 Encounter for screening for malignant neoplasm of prostate: Secondary | ICD-10-CM | POA: Diagnosis not present

## 2015-01-30 DIAGNOSIS — E119 Type 2 diabetes mellitus without complications: Secondary | ICD-10-CM | POA: Diagnosis not present

## 2015-02-01 DIAGNOSIS — I10 Essential (primary) hypertension: Secondary | ICD-10-CM | POA: Diagnosis not present

## 2015-02-01 DIAGNOSIS — K7581 Nonalcoholic steatohepatitis (NASH): Secondary | ICD-10-CM | POA: Diagnosis not present

## 2015-02-01 DIAGNOSIS — G629 Polyneuropathy, unspecified: Secondary | ICD-10-CM | POA: Diagnosis not present

## 2015-02-01 DIAGNOSIS — D696 Thrombocytopenia, unspecified: Secondary | ICD-10-CM | POA: Diagnosis not present

## 2015-02-01 DIAGNOSIS — E782 Mixed hyperlipidemia: Secondary | ICD-10-CM | POA: Diagnosis not present

## 2015-03-13 ENCOUNTER — Other Ambulatory Visit (HOSPITAL_COMMUNITY): Payer: Self-pay | Admitting: Internal Medicine

## 2015-03-13 ENCOUNTER — Ambulatory Visit (HOSPITAL_COMMUNITY)
Admission: RE | Admit: 2015-03-13 | Discharge: 2015-03-13 | Disposition: A | Discharge status: Home / Self Care | Payer: Medicare Other | Source: Ambulatory Visit | Attending: Internal Medicine | Admitting: Internal Medicine

## 2015-03-13 DIAGNOSIS — R52 Pain, unspecified: Secondary | ICD-10-CM

## 2015-03-13 DIAGNOSIS — R609 Edema, unspecified: Secondary | ICD-10-CM

## 2015-03-13 DIAGNOSIS — G629 Polyneuropathy, unspecified: Secondary | ICD-10-CM | POA: Diagnosis not present

## 2015-03-13 DIAGNOSIS — M7989 Other specified soft tissue disorders: Secondary | ICD-10-CM | POA: Diagnosis not present

## 2015-03-13 DIAGNOSIS — M79662 Pain in left lower leg: Secondary | ICD-10-CM | POA: Insufficient documentation

## 2015-03-13 DIAGNOSIS — Z720 Tobacco use: Secondary | ICD-10-CM | POA: Diagnosis not present

## 2015-03-13 DIAGNOSIS — R600 Localized edema: Secondary | ICD-10-CM | POA: Insufficient documentation

## 2015-03-13 DIAGNOSIS — M79605 Pain in left leg: Secondary | ICD-10-CM | POA: Diagnosis not present

## 2015-03-13 DIAGNOSIS — R2242 Localized swelling, mass and lump, left lower limb: Secondary | ICD-10-CM | POA: Diagnosis not present

## 2015-04-12 DIAGNOSIS — J00 Acute nasopharyngitis [common cold]: Secondary | ICD-10-CM | POA: Diagnosis not present

## 2015-04-12 DIAGNOSIS — J206 Acute bronchitis due to rhinovirus: Secondary | ICD-10-CM | POA: Diagnosis not present

## 2015-04-12 DIAGNOSIS — R05 Cough: Secondary | ICD-10-CM | POA: Diagnosis not present

## 2015-04-29 DIAGNOSIS — E782 Mixed hyperlipidemia: Secondary | ICD-10-CM | POA: Diagnosis not present

## 2015-04-29 DIAGNOSIS — I10 Essential (primary) hypertension: Secondary | ICD-10-CM | POA: Diagnosis not present

## 2015-04-29 DIAGNOSIS — E1142 Type 2 diabetes mellitus with diabetic polyneuropathy: Secondary | ICD-10-CM | POA: Diagnosis not present

## 2015-05-01 DIAGNOSIS — I10 Essential (primary) hypertension: Secondary | ICD-10-CM | POA: Diagnosis not present

## 2015-05-01 DIAGNOSIS — E782 Mixed hyperlipidemia: Secondary | ICD-10-CM | POA: Diagnosis not present

## 2015-05-01 DIAGNOSIS — E1142 Type 2 diabetes mellitus with diabetic polyneuropathy: Secondary | ICD-10-CM | POA: Diagnosis not present

## 2015-05-01 DIAGNOSIS — G629 Polyneuropathy, unspecified: Secondary | ICD-10-CM | POA: Diagnosis not present

## 2015-05-21 DIAGNOSIS — Z961 Presence of intraocular lens: Secondary | ICD-10-CM | POA: Diagnosis not present

## 2015-05-21 DIAGNOSIS — E119 Type 2 diabetes mellitus without complications: Secondary | ICD-10-CM | POA: Diagnosis not present

## 2015-07-29 DIAGNOSIS — L03032 Cellulitis of left toe: Secondary | ICD-10-CM | POA: Diagnosis not present

## 2015-07-29 DIAGNOSIS — E1142 Type 2 diabetes mellitus with diabetic polyneuropathy: Secondary | ICD-10-CM | POA: Diagnosis not present

## 2015-07-29 DIAGNOSIS — Z720 Tobacco use: Secondary | ICD-10-CM | POA: Diagnosis not present

## 2015-08-05 ENCOUNTER — Other Ambulatory Visit (HOSPITAL_COMMUNITY): Payer: Self-pay | Admitting: Internal Medicine

## 2015-08-06 ENCOUNTER — Other Ambulatory Visit (HOSPITAL_COMMUNITY): Payer: Self-pay | Admitting: Internal Medicine

## 2015-08-06 DIAGNOSIS — E11621 Type 2 diabetes mellitus with foot ulcer: Secondary | ICD-10-CM

## 2015-08-06 DIAGNOSIS — L97509 Non-pressure chronic ulcer of other part of unspecified foot with unspecified severity: Principal | ICD-10-CM

## 2015-08-09 ENCOUNTER — Ambulatory Visit (HOSPITAL_COMMUNITY): Admission: RE | Admit: 2015-08-09 | Payer: Medicare Other | Source: Ambulatory Visit

## 2015-08-12 ENCOUNTER — Ambulatory Visit (HOSPITAL_COMMUNITY)
Admission: RE | Admit: 2015-08-12 | Discharge: 2015-08-12 | Disposition: A | Discharge status: Home / Self Care | Payer: Medicare Other | Source: Ambulatory Visit | Attending: Internal Medicine | Admitting: Internal Medicine

## 2015-08-12 DIAGNOSIS — E1142 Type 2 diabetes mellitus with diabetic polyneuropathy: Secondary | ICD-10-CM | POA: Insufficient documentation

## 2015-08-12 DIAGNOSIS — I739 Peripheral vascular disease, unspecified: Secondary | ICD-10-CM | POA: Diagnosis not present

## 2015-08-12 DIAGNOSIS — L97509 Non-pressure chronic ulcer of other part of unspecified foot with unspecified severity: Secondary | ICD-10-CM

## 2015-08-12 DIAGNOSIS — E11621 Type 2 diabetes mellitus with foot ulcer: Secondary | ICD-10-CM | POA: Diagnosis not present

## 2015-08-12 DIAGNOSIS — L97519 Non-pressure chronic ulcer of other part of right foot with unspecified severity: Secondary | ICD-10-CM | POA: Diagnosis not present

## 2015-09-02 DIAGNOSIS — E782 Mixed hyperlipidemia: Secondary | ICD-10-CM | POA: Diagnosis not present

## 2015-09-02 DIAGNOSIS — E1142 Type 2 diabetes mellitus with diabetic polyneuropathy: Secondary | ICD-10-CM | POA: Diagnosis not present

## 2015-09-04 DIAGNOSIS — D696 Thrombocytopenia, unspecified: Secondary | ICD-10-CM | POA: Diagnosis not present

## 2015-09-04 DIAGNOSIS — Z23 Encounter for immunization: Secondary | ICD-10-CM | POA: Diagnosis not present

## 2015-09-04 DIAGNOSIS — Z0001 Encounter for general adult medical examination with abnormal findings: Secondary | ICD-10-CM | POA: Diagnosis not present

## 2015-09-04 DIAGNOSIS — I10 Essential (primary) hypertension: Secondary | ICD-10-CM | POA: Diagnosis not present

## 2015-09-04 DIAGNOSIS — E782 Mixed hyperlipidemia: Secondary | ICD-10-CM | POA: Diagnosis not present

## 2015-09-04 DIAGNOSIS — G629 Polyneuropathy, unspecified: Secondary | ICD-10-CM | POA: Diagnosis not present

## 2015-09-04 DIAGNOSIS — E1142 Type 2 diabetes mellitus with diabetic polyneuropathy: Secondary | ICD-10-CM | POA: Diagnosis not present

## 2015-09-16 DIAGNOSIS — Z23 Encounter for immunization: Secondary | ICD-10-CM | POA: Diagnosis not present

## 2016-02-03 DIAGNOSIS — R05 Cough: Secondary | ICD-10-CM | POA: Diagnosis not present

## 2016-02-03 DIAGNOSIS — H6501 Acute serous otitis media, right ear: Secondary | ICD-10-CM | POA: Diagnosis not present

## 2016-03-04 DIAGNOSIS — E1142 Type 2 diabetes mellitus with diabetic polyneuropathy: Secondary | ICD-10-CM | POA: Diagnosis not present

## 2016-03-04 DIAGNOSIS — E782 Mixed hyperlipidemia: Secondary | ICD-10-CM | POA: Diagnosis not present

## 2016-03-06 DIAGNOSIS — I10 Essential (primary) hypertension: Secondary | ICD-10-CM | POA: Diagnosis not present

## 2016-03-06 DIAGNOSIS — G629 Polyneuropathy, unspecified: Secondary | ICD-10-CM | POA: Diagnosis not present

## 2016-03-06 DIAGNOSIS — Z23 Encounter for immunization: Secondary | ICD-10-CM | POA: Diagnosis not present

## 2016-03-06 DIAGNOSIS — E782 Mixed hyperlipidemia: Secondary | ICD-10-CM | POA: Diagnosis not present

## 2016-03-06 DIAGNOSIS — D696 Thrombocytopenia, unspecified: Secondary | ICD-10-CM | POA: Diagnosis not present

## 2016-03-06 DIAGNOSIS — E1142 Type 2 diabetes mellitus with diabetic polyneuropathy: Secondary | ICD-10-CM | POA: Diagnosis not present

## 2016-08-11 DIAGNOSIS — H5213 Myopia, bilateral: Secondary | ICD-10-CM | POA: Diagnosis not present

## 2016-08-11 DIAGNOSIS — Z961 Presence of intraocular lens: Secondary | ICD-10-CM | POA: Diagnosis not present

## 2016-08-11 DIAGNOSIS — H52203 Unspecified astigmatism, bilateral: Secondary | ICD-10-CM | POA: Diagnosis not present

## 2016-08-11 DIAGNOSIS — H524 Presbyopia: Secondary | ICD-10-CM | POA: Diagnosis not present

## 2016-08-11 DIAGNOSIS — E119 Type 2 diabetes mellitus without complications: Secondary | ICD-10-CM | POA: Diagnosis not present

## 2016-09-10 DIAGNOSIS — I10 Essential (primary) hypertension: Secondary | ICD-10-CM | POA: Diagnosis not present

## 2016-09-10 DIAGNOSIS — E1142 Type 2 diabetes mellitus with diabetic polyneuropathy: Secondary | ICD-10-CM | POA: Diagnosis not present

## 2016-09-10 DIAGNOSIS — R972 Elevated prostate specific antigen [PSA]: Secondary | ICD-10-CM | POA: Diagnosis not present

## 2016-09-14 DIAGNOSIS — E1142 Type 2 diabetes mellitus with diabetic polyneuropathy: Secondary | ICD-10-CM | POA: Diagnosis not present

## 2016-09-14 DIAGNOSIS — D72819 Decreased white blood cell count, unspecified: Secondary | ICD-10-CM | POA: Diagnosis not present

## 2016-09-14 DIAGNOSIS — E782 Mixed hyperlipidemia: Secondary | ICD-10-CM | POA: Diagnosis not present

## 2016-09-14 DIAGNOSIS — D696 Thrombocytopenia, unspecified: Secondary | ICD-10-CM | POA: Diagnosis not present

## 2016-09-14 DIAGNOSIS — I10 Essential (primary) hypertension: Secondary | ICD-10-CM | POA: Diagnosis not present

## 2016-11-04 ENCOUNTER — Other Ambulatory Visit: Payer: Self-pay | Admitting: Urology

## 2016-11-04 ENCOUNTER — Ambulatory Visit (INDEPENDENT_AMBULATORY_CARE_PROVIDER_SITE_OTHER): Payer: Medicare Other | Admitting: Urology

## 2016-11-04 DIAGNOSIS — R972 Elevated prostate specific antigen [PSA]: Secondary | ICD-10-CM

## 2016-11-20 DIAGNOSIS — J019 Acute sinusitis, unspecified: Secondary | ICD-10-CM | POA: Diagnosis not present

## 2016-11-23 DIAGNOSIS — I10 Essential (primary) hypertension: Secondary | ICD-10-CM | POA: Diagnosis not present

## 2016-12-02 ENCOUNTER — Ambulatory Visit (HOSPITAL_COMMUNITY)
Admission: RE | Admit: 2016-12-02 | Discharge: 2016-12-02 | Disposition: A | Discharge status: Home / Self Care | Payer: Medicare Other | Source: Ambulatory Visit | Attending: Urology | Admitting: Urology

## 2016-12-02 DIAGNOSIS — C61 Malignant neoplasm of prostate: Secondary | ICD-10-CM | POA: Diagnosis not present

## 2016-12-02 DIAGNOSIS — R972 Elevated prostate specific antigen [PSA]: Secondary | ICD-10-CM

## 2016-12-02 MED ORDER — LIDOCAINE HCL (PF) 2 % IJ SOLN
INTRAMUSCULAR | Status: DC
Start: 2016-12-02 — End: 2016-12-03
  Filled 2016-12-02: qty 10

## 2016-12-16 ENCOUNTER — Ambulatory Visit: Payer: Medicare Other | Admitting: Urology

## 2016-12-16 DIAGNOSIS — C61 Malignant neoplasm of prostate: Secondary | ICD-10-CM | POA: Diagnosis not present

## 2016-12-22 ENCOUNTER — Other Ambulatory Visit: Payer: Self-pay | Admitting: Urology

## 2016-12-22 DIAGNOSIS — C61 Malignant neoplasm of prostate: Secondary | ICD-10-CM

## 2016-12-23 DIAGNOSIS — M25562 Pain in left knee: Secondary | ICD-10-CM | POA: Diagnosis not present

## 2016-12-23 DIAGNOSIS — J309 Allergic rhinitis, unspecified: Secondary | ICD-10-CM | POA: Diagnosis not present

## 2016-12-25 ENCOUNTER — Other Ambulatory Visit: Payer: Self-pay | Admitting: Urology

## 2016-12-25 DIAGNOSIS — C61 Malignant neoplasm of prostate: Secondary | ICD-10-CM

## 2016-12-30 ENCOUNTER — Encounter (HOSPITAL_COMMUNITY): Payer: Medicare Other

## 2017-01-06 ENCOUNTER — Other Ambulatory Visit (HOSPITAL_COMMUNITY): Payer: Self-pay | Admitting: Adult Health Nurse Practitioner

## 2017-01-06 ENCOUNTER — Ambulatory Visit (HOSPITAL_COMMUNITY)
Admission: RE | Admit: 2017-01-06 | Discharge: 2017-01-06 | Disposition: A | Discharge status: Home / Self Care | Payer: Medicare Other | Source: Ambulatory Visit | Attending: Urology | Admitting: Urology

## 2017-01-06 ENCOUNTER — Ambulatory Visit (HOSPITAL_COMMUNITY)
Admission: RE | Admit: 2017-01-06 | Discharge: 2017-01-06 | Disposition: A | Discharge status: Home / Self Care | Payer: Medicare Other | Source: Ambulatory Visit | Attending: Adult Health Nurse Practitioner | Admitting: Adult Health Nurse Practitioner

## 2017-01-06 ENCOUNTER — Encounter (HOSPITAL_COMMUNITY): Payer: Self-pay

## 2017-01-06 DIAGNOSIS — M25561 Pain in right knee: Secondary | ICD-10-CM | POA: Diagnosis not present

## 2017-01-06 DIAGNOSIS — C61 Malignant neoplasm of prostate: Secondary | ICD-10-CM | POA: Diagnosis not present

## 2017-01-06 DIAGNOSIS — R948 Abnormal results of function studies of other organs and systems: Secondary | ICD-10-CM | POA: Diagnosis not present

## 2017-01-06 DIAGNOSIS — M25562 Pain in left knee: Secondary | ICD-10-CM | POA: Diagnosis not present

## 2017-01-06 DIAGNOSIS — W19XXXA Unspecified fall, initial encounter: Secondary | ICD-10-CM

## 2017-01-06 MED ORDER — TECHNETIUM TC 99M MEDRONATE IV KIT
20.0000 | PACK | Freq: Once | INTRAVENOUS | Status: AC | PRN
Start: 2017-01-06 — End: 2017-01-06
  Administered 2017-01-06: 19.9 via INTRAVENOUS

## 2017-01-07 ENCOUNTER — Ambulatory Visit
Admission: RE | Admit: 2017-01-07 | Discharge: 2017-01-07 | Disposition: A | Discharge status: Home / Self Care | Payer: Medicare Other | Source: Ambulatory Visit | Attending: Radiation Oncology | Admitting: Radiation Oncology

## 2017-01-07 ENCOUNTER — Encounter: Payer: Self-pay | Admitting: Radiation Oncology

## 2017-01-07 ENCOUNTER — Encounter: Payer: Self-pay | Admitting: Medical Oncology

## 2017-01-07 VITALS — BP 160/89 | HR 71 | Temp 98.0°F | Resp 20 | Wt 194.1 lb

## 2017-01-07 DIAGNOSIS — G629 Polyneuropathy, unspecified: Secondary | ICD-10-CM | POA: Insufficient documentation

## 2017-01-07 DIAGNOSIS — Z9049 Acquired absence of other specified parts of digestive tract: Secondary | ICD-10-CM | POA: Insufficient documentation

## 2017-01-07 DIAGNOSIS — F419 Anxiety disorder, unspecified: Secondary | ICD-10-CM | POA: Insufficient documentation

## 2017-01-07 DIAGNOSIS — Z79891 Long term (current) use of opiate analgesic: Secondary | ICD-10-CM | POA: Diagnosis not present

## 2017-01-07 DIAGNOSIS — Z809 Family history of malignant neoplasm, unspecified: Secondary | ICD-10-CM | POA: Insufficient documentation

## 2017-01-07 DIAGNOSIS — E119 Type 2 diabetes mellitus without complications: Secondary | ICD-10-CM | POA: Diagnosis not present

## 2017-01-07 DIAGNOSIS — Z510 Encounter for antineoplastic radiation therapy: Secondary | ICD-10-CM | POA: Insufficient documentation

## 2017-01-07 DIAGNOSIS — C61 Malignant neoplasm of prostate: Secondary | ICD-10-CM | POA: Diagnosis not present

## 2017-01-07 DIAGNOSIS — F17210 Nicotine dependence, cigarettes, uncomplicated: Secondary | ICD-10-CM | POA: Insufficient documentation

## 2017-01-07 DIAGNOSIS — Z86718 Personal history of other venous thrombosis and embolism: Secondary | ICD-10-CM | POA: Diagnosis not present

## 2017-01-07 DIAGNOSIS — I10 Essential (primary) hypertension: Secondary | ICD-10-CM | POA: Insufficient documentation

## 2017-01-07 DIAGNOSIS — J410 Simple chronic bronchitis: Secondary | ICD-10-CM | POA: Insufficient documentation

## 2017-01-07 DIAGNOSIS — K769 Liver disease, unspecified: Secondary | ICD-10-CM | POA: Insufficient documentation

## 2017-01-07 DIAGNOSIS — Z79899 Other long term (current) drug therapy: Secondary | ICD-10-CM | POA: Diagnosis not present

## 2017-01-07 DIAGNOSIS — Z888 Allergy status to other drugs, medicaments and biological substances status: Secondary | ICD-10-CM | POA: Diagnosis not present

## 2017-01-07 DIAGNOSIS — M25562 Pain in left knee: Secondary | ICD-10-CM | POA: Diagnosis not present

## 2017-01-07 DIAGNOSIS — Z7984 Long term (current) use of oral hypoglycemic drugs: Secondary | ICD-10-CM | POA: Insufficient documentation

## 2017-01-07 DIAGNOSIS — F329 Major depressive disorder, single episode, unspecified: Secondary | ICD-10-CM | POA: Insufficient documentation

## 2017-01-07 DIAGNOSIS — Z9181 History of falling: Secondary | ICD-10-CM | POA: Diagnosis not present

## 2017-01-07 DIAGNOSIS — Z9889 Other specified postprocedural states: Secondary | ICD-10-CM | POA: Diagnosis not present

## 2017-01-07 DIAGNOSIS — Z791 Long term (current) use of non-steroidal anti-inflammatories (NSAID): Secondary | ICD-10-CM | POA: Insufficient documentation

## 2017-01-07 DIAGNOSIS — R972 Elevated prostate specific antigen [PSA]: Secondary | ICD-10-CM | POA: Diagnosis not present

## 2017-01-07 DIAGNOSIS — K219 Gastro-esophageal reflux disease without esophagitis: Secondary | ICD-10-CM | POA: Diagnosis not present

## 2017-01-07 HISTORY — DX: Depression, unspecified: F32.A

## 2017-01-07 HISTORY — DX: Anxiety disorder, unspecified: F41.9

## 2017-01-07 HISTORY — DX: Major depressive disorder, single episode, unspecified: F32.9

## 2017-01-07 HISTORY — DX: Malignant neoplasm of prostate: C61

## 2017-01-07 HISTORY — DX: Gastro-esophageal reflux disease without esophagitis: K21.9

## 2017-01-07 NOTE — Progress Notes (Signed)
GU Location of Tumor / Histology: prostatic adenocarcinoma  If Prostate Cancer, Gleason Score is (4 + 3) and PSA is (8.2). Prostate volume: 37.8 grams.  [name] was referred by Dr. Merlene Laughter II to Dr. Alyson Ingles in October 2018 for further evaluation of an elevated PSA. Patient reports PSA drawn prior was WNL.  Biopsies of prostate (if applicable) revealed:    Past/Anticipated interventions by urology, if any: prostate biopsy, bone scan (IMPRESSION: Single tiny focus of nonspecific increased tracer localization at a posterior lower LEFT rib question tenth or eleventh ; consider radiographic correlation), CT scan (scheduled for 01/13/2017), referral to Dr. Tresa Moore for consideration of RALP, referral to Dr. Tammi Klippel for consideration of radiation therapy  Past/Anticipated interventions by medical oncology, if any: no  Weight changes, if any: no  Bowel/Bladder complaints, if any: IPSS 9. Denies urinary incontinence or leakage. Denies dysuria or hematuria. Reports ED. Patient reports bowel incontinence related to diverticulitis.   Nausea/Vomiting, if any: no  Pain issues, if any: Taking hydrocodone-acetaminophen for left knee pain related to bursitis.   SAFETY ISSUES:  Prior radiation? no  Pacemaker/ICD? no  Possible current pregnancy? no  Is the patient on methotrexate? no  Current Complaints / other details:  72 year old male. Single. Retired. Smokes 1 pack per day. Patient reports that he has an attorney friend who had radiation for prostate cancer and is now incontinent of urine. Smokers cough noted. Fell at [name] when left knee buckled from bursitis. Reports taking hydrocodone to manage bursitis pain. Garbled speech noted today. BP elevated. Reports he hasn't taken his bp medication today.

## 2017-01-07 NOTE — Progress Notes (Signed)
Radiation Oncology         [phone] ________________________________  Initial outpatient Consultation  Name: [name] MRN: [id number]  Date: 01/07/2017  DOB: 1945/02/26  JJ:[id number], Edwinna Areola, MD  McKenzie, Candee Furbish, MD   REFERRING PHYSICIAN: Cleon Gustin, MD  DIAGNOSIS: 72 y.o. gentleman with stage T1c adenocarcinoma of the prostate with a Gleason's score of 4+3 and a PSA of 8.2    ICD-10-CM   1. Malignant neoplasm of prostate ([place]) Fountain is a 72 y.o. gentleman.  He was noted to have an elevated PSA of 8.2 by his primary care physician, Dr. Nevada Crane.  Previous PSAs were normal by patient report.  Accordingly, he was referred for evaluation in urology by Dr. Alyson Ingles on 11/04/16,  digital rectal examination was performed at that time revealing symmetrical prostate lobes without nodularity.  The patient proceeded to transrectal ultrasound with 12 biopsies of the prostate on 12/02/16.  The prostate volume measured 37.8 cc.  Out of 12 core biopsies,11 were positive.  The maximum Gleason score was 4+3, and this was seen in left mid lateral, left mid, left apex lateral, left apex, right base, right base lateral, right mid, right mid lateral, and right apex lateral. There was a single core of Gleason 3+4 in the right apex and a Gleason 3+3 in the left base lateral.       The patient also underwent bone scan on 01/07/16. This revealed a single tiny focus of nonspecific increased tracer localization at a posterior lower left 10th or 11th rib. The patient reports having had a recent fall onto his left side at home on Christmas Eve and has multiple abrasions and bruises on his left forearm and dorsum of his hand.  This recent injury may correlate with this minimal finding.  He is scheduled for CT A/P on 01/14/16.  The patient reviewed the biopsy results with his urologist and he has kindly been referred today for discussion of potential  radiation treatment options.  Of note, the patient reports taking hydrocodone to manage bursitis pain in his left knee. He has some garbled speech which he attributes to this medication. He also has a smoker's cough and a bandage on his left hand.  PREVIOUS RADIATION THERAPY: No  PAST MEDICAL HISTORY:  has a past medical history of Anxiety, Cancer ([place]), Colitis, Depression, Diabetes mellitus, Diverticulitis, Femoral deep venous thrombosis (DVT) ([place]) (2009), GERD (gastroesophageal reflux disease), High triglycerides, Hypertension, Liver disease, Peripheral neuropathy, and Prostate cancer ([place]).    PAST SURGICAL HISTORY: Past Surgical History:  Procedure Laterality Date  . CHOLECYSTECTOMY    . COLON SURGERY     for diverticulitis. Removed 13 inches were removed  . COLONOSCOPY N/A 06/01/2012   Procedure: COLONOSCOPY;  Surgeon: Rogene Houston, MD;  [facility];  Service: Endoscopy;  Laterality: N/A;  930-moved to [address] to notify pt  . PROSTATE BIOPSY      FAMILY HISTORY: family history includes Cancer in his maternal uncle.  SOCIAL HISTORY:  reports that he has been smoking cigarettes.  He has a 25.00 pack-year smoking history. he has never used smokeless tobacco. He reports that he drinks alcohol. He reports that he does not use drugs.  ALLERGIES: Metronidazole  MEDICATIONS:  Current Outpatient Medications  Medication Sig Dispense Refill  . acetaminophen (TYLENOL) 500 MG tablet Take 1,000 mg by mouth every 6 (six) hours as needed for pain (headache).    Marland Kitchen  ALPRAZolam (XANAX) 0.5 MG tablet Take 0.5 mg by mouth 3 (three) times daily as needed for anxiety.    . bacitracin ointment Apply 1 application topically 2 (two) times daily. 120 g 0  . Choline Fenofibrate (FENOFIBRIC ACID) 45 MG CPDR Take 1 capsule by mouth daily.    Marland Kitchen FIBER PO Take 1 capsule by mouth as needed.    Marland Kitchen glipiZIDE (GLUCOTROL XL) 5 MG 24 hr tablet Take 5 mg by mouth at bedtime.    Marland Kitchen  HYDROcodone-acetaminophen (NORCO) 7.5-325 MG per tablet Take 1 tablet by mouth every 6 (six) hours as needed for moderate pain. 56 tablet 0  . lisinopril (PRINIVIL,ZESTRIL) 20 MG tablet Take 20 mg by mouth every evening.     Marland Kitchen LYRICA 150 MG capsule     . metFORMIN (GLUCOPHAGE) 500 MG tablet Take by mouth 2 (two) times daily with a meal.    . naproxen sodium (ALEVE) 220 MG tablet Take 220 mg by mouth 2 (two) times daily as needed (headache/pain).    . Omega 3 1000 MG CAPS Take 2 capsules by mouth 2 (two) times daily.    . pravastatin (PRAVACHOL) 20 MG tablet Take 20 mg by mouth at bedtime.    Marland Kitchen PRISTIQ 50 MG 24 hr tablet Take 50 mg by mouth at bedtime.    . TRADJENTA 5 MG TABS tablet Take 5 mg by mouth at bedtime.    Marland Kitchen zolpidem (AMBIEN) 10 MG tablet Take 10 mg by mouth at bedtime as needed for sleep.     No current facility-administered medications for this encounter.     REVIEW OF SYSTEMS:  On review of systems, the patient reports that he is doing well overall. He denies any chest pain, shortness of breath, cough, fevers, chills, night sweats, unintended weight changes. He denies any bowel or bladder disturbances, and denies abdominal pain, nausea or vomiting. He denies any new musculoskeletal or joint aches or pains, new skin lesions or concerns. The patient completed an IPSS and IIEF questionnaire.  His IPSS score was 9 indicating mild-moderate urinary outflow obstructive symptoms.  He has severe ED but is not currently sexually active. A complete review of systems is obtained and is otherwise negative.     PHYSICAL EXAM:   weight is 194 lb 2 oz (88.1 kg). His oral temperature is 98 F (36.7 C). His blood pressure is 160/89 (abnormal) and his pulse is 71. His respiration is 20 and oxygen saturation is 100%.    In general this is a well appearing Caucasian male in no acute distress. He is alert and oriented x4 and appropriate throughout the examination. HEENT reveals that the patient is  normocephalic, atraumatic. EOMs are intact. PERRLA. Skin is intact without any evidence of gross lesions. Cardiovascular exam reveals a regular rate and rhythm, no clicks rubs or murmurs are auscultated. Chest is clear to auscultation bilaterally. Lymphatic assessment is performed and does not reveal any adenopathy in the cervical, supraclavicular, axillary, or inguinal chains. Abdomen has active bowel sounds in all quadrants and is intact. The abdomen is soft, non tender, non distended. Lower extremities are negative for pretibial pitting edema, deep calf tenderness, cyanosis or clubbing.  KPS = 90  100 - Normal; no complaints; no evidence of disease. 90   - Able to carry on normal activity; minor signs or symptoms of disease. 80   - Normal activity with effort; some signs or symptoms of disease. 42   - Cares for self; unable to carry  on normal activity or to do active work. 60   - Requires occasional assistance, but is able to care for most of his personal needs. 50   - Requires considerable assistance and frequent medical care. 27   - Disabled; requires special care and assistance. 24   - Severely disabled; hospital admission is indicated although death not imminent. 70   - Very sick; hospital admission necessary; active supportive treatment necessary. 10   - Moribund; fatal processes progressing rapidly. 0     - Dead  Karnofsky DA, Abelmann Deering, Craver LS and Burchenal [facility] [phone]) The use of the nitrogen mustards in the palliative treatment of carcinoma: with particular reference to bronchogenic carcinoma Cancer 1 634-56   LABORATORY DATA:  Lab Results  Component Value Date   WBC 2.4 (L) 08/10/2013   HGB 16.0 08/10/2013   HCT 43.4 08/10/2013   MCV 95.4 08/10/2013   PLT 62 (L) 08/10/2013   Lab Results  Component Value Date   NA 139 07/19/2013   K 4.5 07/19/2013   CL 100 07/19/2013   CO2 28 07/19/2013   Lab Results  Component Value Date   ALT 33 07/19/2013   AST 35 07/19/2013    ALKPHOS 58 07/19/2013   BILITOT 1.0 07/19/2013     RADIOGRAPHY: Nm Bone Scan Whole Body  Result Date: 01/06/2017 CLINICAL DATA:  Prostate cancer, history of RIGHT knee pain, fell on 12/28/2016 with injury to LEFT arm, prior LEFT humeral fracture 2016 EXAM: NUCLEAR MEDICINE WHOLE BODY BONE SCAN TECHNIQUE: Whole body anterior and posterior images were obtained approximately 3 hours after intravenous injection of radiopharmaceutical. RADIOPHARMACEUTICALS:  19.9 mCi Technetium-26m MDP IV COMPARISON:  None Radiographic correlation: LEFT shoulder radiographs 08/28/2014 FINDINGS: Mild uptake at the RIGHT shoulder, RIGHT sternoclavicular joint and RIGHT knee, typically degenerative. Uptake at the LEFT shoulder corresponding to site of prior fracture. Single tiny focus of nonspecific increased tracer localization at posterior LEFT 10th or 11th rib. No additional worrisome sites of abnormal osseous tracer accumulation identified. Expected urinary tract and soft tissue distribution of tracer. IMPRESSION: Single tiny focus of nonspecific increased tracer localization at a posterior lower LEFT rib question tenth or eleventh ; consider radiographic correlation. Remainder of exam unremarkable. Electronically Signed   By: Lavonia Dana M.D.   On: 01/06/2017 15:22   Dg Knee Complete 4 Views Right  Result Date: 01/06/2017 CLINICAL DATA:  72 y/o M; initial encounter. Fall on 12/29/2016 with medial right knee pain and difficulty walking. EXAM: RIGHT KNEE - COMPLETE 4+ VIEW COMPARISON:  None. FINDINGS: No evidence of fracture, dislocation, or joint effusion. No evidence of arthropathy or other focal bone abnormality. Vascular calcifications. IMPRESSION: Negative. Electronically Signed   By: Kristine Garbe M.D.   On: 01/06/2017 16:04      IMPRESSION: This gentleman is 72 years old.  His T-Stage, Gleason's Score, and PSA put him into the intermediate risk group.  Accordingly he is eligible for a variety of potential  treatment options including external beam radiation, radioactive seed implant, or prostatectomy.  PLAN: Today, we reviewed the findings and workup thus far.  We discussed the natural history of prostate cancer.  We reviewed the the implications of T-stage, Gleason's Score, and PSA on decision-making and outcomes in prostate cancer.  We discussed radiation treatment in the management of prostate cancer with regard to the logistics and delivery of external beam radiation treatment as well as the logistics and delivery of prostate brachytherapy.  We also discussed the role of SpaceOAR  in reducing the rectal toxicity associated with radiotherapy. We compared and contrasted each of these approaches and also compared these against prostatectomy.  The patient expressed interest in prostate brachytherapy with SpaceOAR.  At the end of our conversation, the patient elects to proceed with prostate brachytherapy and SpaceOAR placement.  We will share our findings with Dr. Alyson Ingles.  The patient will proceed with CT abdomen pelvis as scheduled on 01/13/17 and pending there are no concerning findings, we will move forward with scheduling the procedure in the near future.  We enjoyed meeting with him today, and will look forward to participating in the care of this very nice gentleman.  We spent 60 minutes face to face with the patient and more than 50% of that time was spent in counseling and/or coordination of care.     Nicholos Johns, PA-C    Tyler Pita, MD  [facility] Direct Dial: [phone]  Fax: [phone] [email or web address]  Skype  LinkedIn    Page Me      This document serves as a record of services personally performed by Tyler Pita, MD and Freeman Caldron, PA-C. It was created on their behalf by Bethann Humble, a trained medical scribe. The creation of this record is based on the scribe's personal observations and the provider's statements to them. This  document has been checked and approved by the attending provider.

## 2017-01-07 NOTE — Progress Notes (Signed)
See progress note under physician encounter. 

## 2017-01-07 NOTE — Progress Notes (Signed)
Introduced myself to [name] as the prostate nurse navigator and my role. He states that he has limited knowledge of radiation treatments for prostate cancer but has a friend who is incontinent of urine due to radiation. He is interested in learning about his radiation treatment options as well as surgery. He states he fell during Christmas when his left knee gave way. Bruising noted on the left arm and bandage on left hand. I did note some slurring of speech and he states he took hydrocodone before arrival. He states he is not driving. I will continue to follow and asked to call with questions or concerns.

## 2017-01-11 ENCOUNTER — Ambulatory Visit: Payer: Medicare Other | Admitting: Orthopedic Surgery

## 2017-01-11 ENCOUNTER — Encounter: Payer: Self-pay | Admitting: Orthopedic Surgery

## 2017-01-11 VITALS — BP 162/89 | HR 62 | Ht 74.0 in | Wt 194.0 lb

## 2017-01-11 DIAGNOSIS — M129 Arthropathy, unspecified: Secondary | ICD-10-CM | POA: Diagnosis not present

## 2017-01-11 DIAGNOSIS — M1711 Unilateral primary osteoarthritis, right knee: Secondary | ICD-10-CM | POA: Diagnosis not present

## 2017-01-11 DIAGNOSIS — M25561 Pain in right knee: Secondary | ICD-10-CM

## 2017-01-11 NOTE — Patient Instructions (Signed)
Steps to Quit Smoking Smoking tobacco can be bad for your health. It can also affect almost every organ in your body. Smoking puts you and people around you at risk for many serious long-lasting (chronic) diseases. Quitting smoking is hard, but it is one of the best things that you can do for your health. It is never too late to quit. What are the benefits of quitting smoking? When you quit smoking, you lower your risk for getting serious diseases and conditions. They can include:  Lung cancer or lung disease.  Heart disease.  Stroke.  Heart attack.  Not being able to have children (infertility).  Weak bones (osteoporosis) and broken bones (fractures).  If you have coughing, wheezing, and shortness of breath, those symptoms may get better when you quit. You may also get sick less often. If you are pregnant, quitting smoking can help to lower your chances of having a baby of low birth weight. What can I do to help me quit smoking? Talk with your doctor about what can help you quit smoking. Some things you can do (strategies) include:  Quitting smoking totally, instead of slowly cutting back how much you smoke over a period of time.  Going to in-person counseling. You are more likely to quit if you go to many counseling sessions.  Using resources and support systems, such as: ? Online chats with a counselor. ? Phone quitlines. ? Printed self-help materials. ? Support groups or group counseling. ? Text messaging programs. ? Mobile phone apps or applications.  Taking medicines. Some of these medicines may have nicotine in them. If you are pregnant or breastfeeding, do not take any medicines to quit smoking unless your doctor says it is okay. Talk with your doctor about counseling or other things that can help you.  Talk with your doctor about using more than one strategy at the same time, such as taking medicines while you are also going to in-person counseling. This can help make  quitting easier. What things can I do to make it easier to quit? Quitting smoking might feel very hard at first, but there is a lot that you can do to make it easier. Take these steps:  Talk to your family and friends. Ask them to support and encourage you.  Call phone quitlines, reach out to support groups, or work with a counselor.  Ask people who smoke to not smoke around you.  Avoid places that make you want (trigger) to smoke, such as: ? Bars. ? Parties. ? Smoke-break areas at work.  Spend time with people who do not smoke.  Lower the stress in your life. Stress can make you want to smoke. Try these things to help your stress: ? Getting regular exercise. ? Deep-breathing exercises. ? Yoga. ? Meditating. ? Doing a body scan. To do this, close your eyes, focus on one area of your body at a time from head to toe, and notice which parts of your body are tense. Try to relax the muscles in those areas.  Download or buy apps on your mobile phone or tablet that can help you stick to your quit plan. There are many free apps, such as QuitGuide from the CDC (Centers for Disease Control and Prevention). You can find more support from smokefree.gov and other websites.  This information is not intended to replace advice given to you by your health care provider. Make sure you discuss any questions you have with your health care provider. Document Released: 10/18/2008 Document   Revised: 08/20/2015 Document Reviewed: 05/08/2014 Elsevier Interactive Patient Education  2018 Elsevier Inc.  

## 2017-01-11 NOTE — Progress Notes (Signed)
NEW PROBLEM OFFICE VISIT    Chief Complaint  Patient presents with  . Knee Pain    right     72 year old male WITH DIABETES presents for evaluation of his right knee.  72 year old male complains of dull constant moderate medial right knee pain after falling on Christmas day.  Approximately 2 weeks ago this is associated with some swelling and giving way.      Review of Systems  Constitutional: Negative for chills and fever.  Musculoskeletal: Negative for back pain.  Neurological: Negative for tingling and sensory change.     Past Medical History:  Diagnosis Date  . Anxiety   . Cancer [facility])    Prostate  . Colitis    Mucous colitis  . Depression   . Diabetes mellitus    x 30 yrs  . Diverticulitis   . Femoral deep venous thrombosis (DVT) ([place]) 2009   both upper legs  . GERD (gastroesophageal reflux disease)   . High triglycerides   . Hypertension   . Liver disease   . Peripheral neuropathy   . Prostate cancer [facility])     Past Surgical History:  Procedure Laterality Date  . CHOLECYSTECTOMY    . COLON SURGERY     for diverticulitis. Removed 13 inches were removed  . COLONOSCOPY N/A 06/01/2012   Procedure: COLONOSCOPY;  Surgeon: Rogene Houston, MD;  Location: [facility];  Service: Endoscopy;  Laterality: N/A;  930-moved to 1025 Ann to notify pt  . PROSTATE BIOPSY      Family History  Problem Relation Age of Onset  . Cancer Maternal Uncle        colon   Social History   Tobacco Use  . Smoking status: Current Every Day Smoker    Packs/day: 0.50    Years: 50.00    Pack years: 25.00    Types: Cigarettes  . Smokeless tobacco: Never Used  . Tobacco comment: 1/2 pack a day.  Substance Use Topics  . Alcohol use: Yes    Comment: drink occasionally. drinks less than a 6 pack a day. He drinks bourbon and wine.  He does not drink every day  . Drug use: No    Allergies  Allergen Reactions  . Metronidazole     REACTION: causes nausea and vomiting      No outpatient medications have been marked as taking for the 01/11/17 encounter (Appointment) with Carole Civil, MD.    BP (!) 162/89   Pulse 62   Ht 6\' 2"  (1.88 m)   Wt 194 lb (88 kg)   BMI 24.91 kg/m   Physical Exam  Constitutional: He is oriented to person, place, and time. He appears well-developed and well-nourished.  Vital signs have been reviewed and are stable. Gen. appearance the patient is well-developed and well-nourished with normal grooming and hygiene.   Musculoskeletal:       Right knee: He exhibits effusion.  GAIT IS not supported by cane or brace he does have a slight limp  Neurological: He is alert and oriented to person, place, and time.  Skin: Skin is warm and dry. No erythema.  Psychiatric: He has a normal mood and affect.  Vitals reviewed.   Right Knee Exam   Muscle Strength  The patient has normal right knee strength.  Tenderness  The patient is experiencing tenderness in the medial joint line.  Range of Motion  Extension:  5 normal  Flexion: normal   Tests  McMurray:  Medial - negative  Lateral - negative Varus: negative Valgus: negative Drawer:  Anterior - negative    Posterior - negative  Other  Erythema: absent Scars: absent Sensation: normal Pulse: present Swelling: none Effusion: effusion present   Left Knee Exam   Muscle Strength  The patient has normal left knee strength.  Tenderness  The patient is experiencing no tenderness.   Range of Motion  Extension: normal  Flexion: normal   Tests  McMurray:  Medial - negative Lateral - negative Varus: negative Valgus: negative Drawer:  Anterior - negative     Posterior - negative  Other  Erythema: absent Scars: absent Sensation: normal Pulse: present Swelling: none      ASSESSMENT/PLAN:   The x-ray upon my review showed narrowing of the medial compartment calcification of the popliteal vessel small joint effusion normal alignment   X-ray  report EXAM: RIGHT KNEE - COMPLETE 4+ VIEW   COMPARISON:  None.   FINDINGS: No evidence of fracture, dislocation, or joint effusion. No evidence of arthropathy or other focal bone abnormality. Vascular calcifications.   IMPRESSION: Negative.     Electronically Signed   By: Kristine Garbe M.D.   On: 01/06/2017 16:04  Encounter Diagnoses  Name Primary?  . Acute pain of right knee Yes  . Primary osteoarthritis of right knee     72 year old male recent diagnosis of prostate cancer presents after his right knee gave way.  He has arthritis on x-ray it looks to be mild to moderate.  He has medial joint line pain possible meniscal tear  Recommend bracing and injection and a follow-up after 4-6 weeks for reevaluation  Procedure note right knee injection verbal consent was obtained to inject right knee joint  Timeout was completed to confirm the site of injection  The medications used were 40 mg of Depo-Medrol and 1% lidocaine 3 cc  Anesthesia was provided by ethyl chloride and the skin was prepped with alcohol.  After cleaning the skin with alcohol a 20-gauge needle was used to inject the right knee joint. There were no complications. A sterile bandage was applied.

## 2017-01-13 ENCOUNTER — Ambulatory Visit (HOSPITAL_COMMUNITY)
Admission: RE | Admit: 2017-01-13 | Discharge: 2017-01-13 | Disposition: A | Discharge status: Home / Self Care | Payer: Medicare Other | Source: Ambulatory Visit | Attending: Urology | Admitting: Urology

## 2017-01-13 DIAGNOSIS — I2510 Atherosclerotic heart disease of native coronary artery without angina pectoris: Secondary | ICD-10-CM | POA: Insufficient documentation

## 2017-01-13 DIAGNOSIS — S2243XA Multiple fractures of ribs, bilateral, initial encounter for closed fracture: Secondary | ICD-10-CM | POA: Insufficient documentation

## 2017-01-13 DIAGNOSIS — K7460 Unspecified cirrhosis of liver: Secondary | ICD-10-CM | POA: Insufficient documentation

## 2017-01-13 DIAGNOSIS — X58XXXA Exposure to other specified factors, initial encounter: Secondary | ICD-10-CM | POA: Insufficient documentation

## 2017-01-13 DIAGNOSIS — C61 Malignant neoplasm of prostate: Secondary | ICD-10-CM | POA: Insufficient documentation

## 2017-01-13 DIAGNOSIS — I700 Atherosclerosis of aorta: Secondary | ICD-10-CM | POA: Diagnosis not present

## 2017-01-13 LAB — POCT I-STAT CREATININE: Creatinine, Ser: 1 mg/dL (ref 0.61–1.24)

## 2017-01-13 MED ORDER — IOPAMIDOL (ISOVUE-300) INJECTION 61%
100.0000 mL | Freq: Once | INTRAVENOUS | Status: AC | PRN
  Administered 2017-01-13: 100 mL via INTRAVENOUS

## 2017-01-18 ENCOUNTER — Telehealth: Payer: Self-pay | Admitting: *Deleted

## 2017-01-18 NOTE — Telephone Encounter (Signed)
CALLED PATIENT TO INFORM OF PRE-SEED APPTS. FOR 01-29-17, LVM FOR A RETURN CALL

## 2017-01-20 ENCOUNTER — Ambulatory Visit: Payer: Medicare Other | Admitting: Urology

## 2017-01-20 DIAGNOSIS — C61 Malignant neoplasm of prostate: Secondary | ICD-10-CM | POA: Diagnosis not present

## 2017-01-25 DIAGNOSIS — J019 Acute sinusitis, unspecified: Secondary | ICD-10-CM | POA: Diagnosis not present

## 2017-01-28 ENCOUNTER — Telehealth: Payer: Self-pay | Admitting: *Deleted

## 2017-01-28 NOTE — Telephone Encounter (Signed)
CALLED PATIENT TO REMIND OF PRE-SEED APPTS. FOR 01-29-17, LVM FOR A RETURN CALL

## 2017-01-29 ENCOUNTER — Encounter: Payer: Self-pay | Admitting: General Practice

## 2017-01-29 ENCOUNTER — Encounter (HOSPITAL_COMMUNITY)
Admission: RE | Admit: 2017-01-29 | Discharge: 2017-01-29 | Disposition: A | Discharge status: Home / Self Care | Payer: Medicare Other | Source: Ambulatory Visit | Attending: Urology | Admitting: Urology

## 2017-01-29 ENCOUNTER — Encounter: Payer: Self-pay | Admitting: Medical Oncology

## 2017-01-29 ENCOUNTER — Ambulatory Visit
Admission: RE | Admit: 2017-01-29 | Discharge: 2017-01-29 | Disposition: A | Discharge status: Home / Self Care | Payer: Medicare Other | Source: Ambulatory Visit | Attending: Radiation Oncology | Admitting: Radiation Oncology

## 2017-01-29 ENCOUNTER — Ambulatory Visit (HOSPITAL_COMMUNITY)
Admission: RE | Admit: 2017-01-29 | Discharge: 2017-01-29 | Disposition: A | Discharge status: Home / Self Care | Payer: Medicare Other | Source: Ambulatory Visit | Attending: Urology | Admitting: Urology

## 2017-01-29 ENCOUNTER — Other Ambulatory Visit: Payer: Self-pay | Admitting: Urology

## 2017-01-29 ENCOUNTER — Telehealth: Payer: Self-pay | Admitting: *Deleted

## 2017-01-29 DIAGNOSIS — E119 Type 2 diabetes mellitus without complications: Secondary | ICD-10-CM | POA: Diagnosis not present

## 2017-01-29 DIAGNOSIS — C61 Malignant neoplasm of prostate: Secondary | ICD-10-CM

## 2017-01-29 DIAGNOSIS — Z01818 Encounter for other preprocedural examination: Secondary | ICD-10-CM | POA: Insufficient documentation

## 2017-01-29 DIAGNOSIS — Z809 Family history of malignant neoplasm, unspecified: Secondary | ICD-10-CM | POA: Diagnosis not present

## 2017-01-29 DIAGNOSIS — I700 Atherosclerosis of aorta: Secondary | ICD-10-CM | POA: Diagnosis not present

## 2017-01-29 DIAGNOSIS — Z7984 Long term (current) use of oral hypoglycemic drugs: Secondary | ICD-10-CM | POA: Diagnosis not present

## 2017-01-29 DIAGNOSIS — Z9181 History of falling: Secondary | ICD-10-CM | POA: Diagnosis not present

## 2017-01-29 DIAGNOSIS — R9431 Abnormal electrocardiogram [ECG] [EKG]: Secondary | ICD-10-CM | POA: Diagnosis not present

## 2017-01-29 DIAGNOSIS — K769 Liver disease, unspecified: Secondary | ICD-10-CM | POA: Diagnosis not present

## 2017-01-29 DIAGNOSIS — K219 Gastro-esophageal reflux disease without esophagitis: Secondary | ICD-10-CM | POA: Diagnosis not present

## 2017-01-29 DIAGNOSIS — Z9889 Other specified postprocedural states: Secondary | ICD-10-CM | POA: Diagnosis not present

## 2017-01-29 DIAGNOSIS — Z510 Encounter for antineoplastic radiation therapy: Secondary | ICD-10-CM | POA: Diagnosis not present

## 2017-01-29 DIAGNOSIS — J410 Simple chronic bronchitis: Secondary | ICD-10-CM | POA: Diagnosis not present

## 2017-01-29 DIAGNOSIS — Z9049 Acquired absence of other specified parts of digestive tract: Secondary | ICD-10-CM | POA: Diagnosis not present

## 2017-01-29 DIAGNOSIS — I10 Essential (primary) hypertension: Secondary | ICD-10-CM | POA: Diagnosis not present

## 2017-01-29 DIAGNOSIS — Z79891 Long term (current) use of opiate analgesic: Secondary | ICD-10-CM | POA: Diagnosis not present

## 2017-01-29 DIAGNOSIS — Z79899 Other long term (current) drug therapy: Secondary | ICD-10-CM | POA: Diagnosis not present

## 2017-01-29 DIAGNOSIS — M25562 Pain in left knee: Secondary | ICD-10-CM | POA: Diagnosis not present

## 2017-01-29 DIAGNOSIS — Z86718 Personal history of other venous thrombosis and embolism: Secondary | ICD-10-CM | POA: Diagnosis not present

## 2017-01-29 DIAGNOSIS — G629 Polyneuropathy, unspecified: Secondary | ICD-10-CM | POA: Diagnosis not present

## 2017-01-29 DIAGNOSIS — Z888 Allergy status to other drugs, medicaments and biological substances status: Secondary | ICD-10-CM | POA: Diagnosis not present

## 2017-01-29 DIAGNOSIS — Z791 Long term (current) use of non-steroidal anti-inflammatories (NSAID): Secondary | ICD-10-CM | POA: Diagnosis not present

## 2017-01-29 NOTE — Progress Notes (Signed)
[place] CSW Progress Note  Patient is scheduled for surgery in April, states he has no one to depend on or provide in home care.  Per patient, he will have day surgery and then will need to "take it easy for several days afterwards."  States that he is very fatigued, "I don't have the energy to fix anything to eat."  States that he has been told that his insurance will cover for "a couple of days in rehab."  CSW will explore options available via patient insurance.  Discussed community resources available, including Meals on Wheels which patient does not want.  Asked desk nurse to refer to [facility] for additional assistance in the community.  CSW will discuss available options for post surgical care w patient.  Edwyna Shell, LCSW Clinical Social Worker Phone:  [phone]

## 2017-01-29 NOTE — Telephone Encounter (Signed)
Called Webb Silversmith (the Education officer, museum) to arrange rehab for this patient after his implant, spoke with Webb Silversmith and she will check it out to see what his insurance will allow

## 2017-01-29 NOTE — Progress Notes (Signed)
  Radiation Oncology         [phone] ________________________________  Name: [name] MRN: [id number]  Date: 01/29/2017  DOB: 1945/10/20  SIMULATION AND TREATMENT PLANNING NOTE PUBIC ARCH STUDY  VE:[facility], Edwinna Areola, MD  Celene Squibb, MD  DIAGNOSIS: 72 y.o. gentleman with stage T1c adenocarcinoma of the prostate with a Gleason's score of 4+3 and a PSA of 8.2     ICD-10-CM   1. Malignant neoplasm of prostate ([place]) C61     COMPLEX SIMULATION:  The patient presented today for evaluation for possible prostate seed implant. He was brought to the radiation planning suite and placed supine on the CT couch. A 3-dimensional image study set was obtained in upload to the planning computer. There, on each axial slice, I contoured the prostate gland. Then, using three-dimensional radiation planning tools I reconstructed the prostate in view of the structures from the transperineal needle pathway to assess for possible pubic arch interference. In doing so, I did not appreciate any pubic arch interference. Also, the patient's prostate volume was estimated based on the drawn structure. The volume was 55 cc.  Given the pubic arch appearance and prostate volume, patient remains a good candidate to proceed with prostate seed implant. Today, he freely provided informed written consent to proceed.    PLAN: The patient will undergo definitive prostate seed implant to 145 Gy   ________________________________  Sheral Apley. Tammi Klippel, M.D.

## 2017-02-02 ENCOUNTER — Encounter: Payer: Self-pay | Admitting: General Practice

## 2017-02-02 NOTE — Progress Notes (Signed)
[place] CSW Progress Note  CSW spoke w patient's insurance carrier - patient must need Medicare eligibility guidelines for placement in SNF rehab.  Spoke w Terrace Arabia, nurse navigator, to learn more about plans for surgery and anticipated needs. Procedure is outpatient and patient is unlikely to need SNF rehab after procedure.  CSW has spoken w community resources who provide in home companions/sitters.  This arrangement would be private pay and not covered by insurance.  CSW will contact patient to discuss various options.  Edwyna Shell, LCSW Clinical Social Worker Phone:  [phone]

## 2017-02-08 DIAGNOSIS — C61 Malignant neoplasm of prostate: Secondary | ICD-10-CM | POA: Diagnosis not present

## 2017-02-08 DIAGNOSIS — Z720 Tobacco use: Secondary | ICD-10-CM | POA: Diagnosis not present

## 2017-02-09 ENCOUNTER — Telehealth: Payer: Self-pay | Admitting: General Practice

## 2017-02-09 ENCOUNTER — Other Ambulatory Visit: Payer: Self-pay | Admitting: Medical Oncology

## 2017-02-09 ENCOUNTER — Encounter: Payer: Self-pay | Admitting: Medical Oncology

## 2017-02-09 DIAGNOSIS — C61 Malignant neoplasm of prostate: Secondary | ICD-10-CM

## 2017-02-09 NOTE — Telephone Encounter (Signed)
[place] CSW Progress Note   Spoke w [employer], verified that he is eligible for transportation benefit.  Requested [facility] referral for additional outpatient support.  Edwyna Shell, LCSW Clinical Social Worker Phone:  [phone]

## 2017-02-09 NOTE — Telephone Encounter (Signed)
[place] CSW Progress Note  Spoke w patient, reviewed criteria for skilled nursing facility placement.  CSW has spoken w insurance company and SNF admissions rep; patient is unlikely to meet criteria for SNF admissions/authoirzation.  Gave resources available through Aging and Primary school teacher ([employer] [phone]) including companion care ($12/hour) and in home aide ($17/hour) - both services are private pay.  Patient will also need help w arranging transportation between home and hospital, states he has no family/friend who can assist w this need.  Gave patient contact information for [employer], CSW will look into options for transportation.  Edwyna Shell, LCSW Clinical Social Worker Phone:  [phone]

## 2017-02-09 NOTE — Telephone Encounter (Signed)
[place] CSW Progress Note  Patient has transportation benefit w [employer] (24 one way trips of 25 miles or less) under his [employer].  This can be used to transport to/from his upcoming surgery.  CSW spoke w patient, gave him contact information for [employer] [phone]).  Patient aware he needs to schedule his transport for upcoming surgery.  Edwyna Shell, LCSW Clinical Social Worker Phone:  [phone]

## 2017-02-09 NOTE — Progress Notes (Signed)
Referral placed to [facility]. [name] has no family support or transportation and will need assistance post brachytherapy.

## 2017-02-10 ENCOUNTER — Telehealth: Payer: Self-pay | Admitting: General Practice

## 2017-02-10 NOTE — Telephone Encounter (Signed)
[place] CSW Progress Note  Call to patient to discuss option of volunteer driver from [name] providing transport on day of surgery.  Awaiting return call.  Edwyna Shell, LCSW Clinical Social Worker Phone:  [phone]

## 2017-02-10 NOTE — Telephone Encounter (Signed)
[place] CSW Progress Note  CSW left VM offering volunteer driver from [name] for transport to surgery, left contact info for [facility] [phone]).  Awaiting call back from patient to discuss options.  Edwyna Shell, LCSW Clinical Social Worker Phone:  [phone]

## 2017-02-16 ENCOUNTER — Other Ambulatory Visit: Payer: Self-pay

## 2017-02-16 NOTE — Patient Outreach (Signed)
[facility]) Care Management  02/16/2017  CHARLENE COWDREY [birth year] [id number]   TELEPHONE SCREENING Referral date: 02/16/17 Referral source: Cancer center Referral reason: Has prostates cancer and will be scheduled for  Brachytherapy. Has no family support.  Insurance: [employer] care  Telephone call to patient regarding cancer center referral. HIPAA verified with patient. Discussed [facility] with patient.  Patient states his major concern is transportation for his upcoming procedure in 04/16/17 . Patient states he received [employer] phone number from Gwendel Hanson regarding transportation assistance. RNCM offered referral to [facility] social worker for transportation assistance. Patient states he has the phone number for [employer] and will contact. Does not feel he needs additional service at this time.  RNCM advised patient to contact his [place] health care customer service department to discuss coverage of transportation. RNCM offered [facility] nurse case management services. Patient states he is managing his own care. Patient states he is very health conscience and knowledgeable regarding his health and does not feel he needs this service. Patient verbally agreed to receive [facility] care management outreach letter and brochure for future reference.    PLAN: RNCM will refer patient to care management assistant to close due to patient refusing services.  RNCM will send patients primary MD notification of closure.  RNCM will send patient [facility] care management outreach letter/ brochure.   Quinn Plowman RN,BSN,CCM [facility] Telephonic  [phone]

## 2017-03-03 ENCOUNTER — Encounter: Payer: Self-pay | Admitting: General Practice

## 2017-03-03 ENCOUNTER — Ambulatory Visit: Payer: Medicare Other | Admitting: Orthopedic Surgery

## 2017-03-03 NOTE — Progress Notes (Signed)
[place] CSW Progress Note  CSW verified that patient is eligible for [employer] transportation assistance by contacting Transportation benefit # on back of patient card.  Per [employer], patient is eligible for 50 miles of transport per trip.  Informed patient of option of transport via Lubertha Sayres. Patient will independently verify details of [employer] transport as he has been informed by insurance company rep that he is not eligible and would be billed for these services.  Will call CSW w choice of transport - either [employer] or [facility].  CSW will hold off on referral to [facility] and will await patient call to determine his choices re transport for surgery.  Edwyna Shell, LCSW Clinical Social Worker Phone:  [phone]

## 2017-03-03 NOTE — Progress Notes (Signed)
[place] CSW Progress Note  Request for transportation assistance made to [name].  Agency will search for volunteer driver to assist w transport to upcoming appointment.  Agency will reach out to patient to discuss details.  Edwyna Shell, LCSW Clinical Social Worker Phone:  [phone]

## 2017-03-05 DIAGNOSIS — I10 Essential (primary) hypertension: Secondary | ICD-10-CM | POA: Diagnosis not present

## 2017-03-08 DIAGNOSIS — E782 Mixed hyperlipidemia: Secondary | ICD-10-CM | POA: Diagnosis not present

## 2017-03-08 DIAGNOSIS — E119 Type 2 diabetes mellitus without complications: Secondary | ICD-10-CM | POA: Diagnosis not present

## 2017-03-08 DIAGNOSIS — I10 Essential (primary) hypertension: Secondary | ICD-10-CM | POA: Diagnosis not present

## 2017-03-08 DIAGNOSIS — C61 Malignant neoplasm of prostate: Secondary | ICD-10-CM | POA: Diagnosis not present

## 2017-04-08 ENCOUNTER — Telehealth: Payer: Self-pay | Admitting: *Deleted

## 2017-04-08 ENCOUNTER — Encounter (HOSPITAL_BASED_OUTPATIENT_CLINIC_OR_DEPARTMENT_OTHER): Payer: Self-pay | Admitting: *Deleted

## 2017-04-08 ENCOUNTER — Other Ambulatory Visit: Payer: Self-pay

## 2017-04-08 NOTE — Progress Notes (Addendum)
SPOKE W/ PT VIA PHONE FOR PRE-OP INTERVIEW.  NPO AFTER MN W/ EXCEPTION CLEAR LIQUIDS UNTIL 0700 ( NO CREAM/ MILK PRODUCTS) THEN NOTHING BY MOUTH WITH EXCEPTION SIPS OF WATER WITH TYLENOL, CLARITIN IF NEEDED.  ARRIVE AT 1100.  CURRENT EKG AND CXR IN CHART AND Epic.  GETTING LAB WORK DONE Friday 04-08-2017 @ 1400 (CBC,CMET,PT/INR,PTT).  WILL DO FLEET ENEMA AM DOS.  PT HAS ARRANGED TRANSPORTATION THROUGH HIS INSURANCE [employer] DOS , RIDE TO [facility] AND PICK UP AT DISCHARGE . PT GAVE CONTACT # FOR  [facility] [facility]SP[facility] [phone].  ADDENDUM:  ROUTED CBC RESULT DATED 04-09-2017 TO DR Alyson Ingles IN Epic.

## 2017-04-08 NOTE — Telephone Encounter (Signed)
Called patient to remind of lab tomorrow- arrival time - 1:45 pm @ [facility] Admitting, lvm for a return call

## 2017-04-09 ENCOUNTER — Encounter (HOSPITAL_COMMUNITY)
Admission: RE | Admit: 2017-04-09 | Discharge: 2017-04-09 | Disposition: A | Discharge status: Home / Self Care | Payer: Medicare Other | Source: Ambulatory Visit | Attending: Urology | Admitting: Urology

## 2017-04-09 ENCOUNTER — Encounter (HOSPITAL_BASED_OUTPATIENT_CLINIC_OR_DEPARTMENT_OTHER): Payer: Self-pay | Admitting: *Deleted

## 2017-04-09 DIAGNOSIS — Z01812 Encounter for preprocedural laboratory examination: Secondary | ICD-10-CM | POA: Insufficient documentation

## 2017-04-09 LAB — COMPREHENSIVE METABOLIC PANEL
ALBUMIN: 3.9 g/dL (ref 3.5–5.0)
ALT: 29 U/L (ref 17–63)
ANION GAP: 8 (ref 5–15)
AST: 28 U/L (ref 15–41)
Alkaline Phosphatase: 109 U/L (ref 38–126)
BUN: 9 mg/dL (ref 6–20)
CO2: 29 mmol/L (ref 22–32)
Calcium: 9 mg/dL (ref 8.9–10.3)
Chloride: 102 mmol/L (ref 101–111)
Creatinine, Ser: 1.19 mg/dL (ref 0.61–1.24)
GFR calc Af Amer: >60 mL/min (ref >60)
GFR calc non Af Amer: 59 mL/min — ABNORMAL LOW (ref >60)
GLUCOSE: 284 mg/dL — AB (ref 65–99)
POTASSIUM: 4.9 mmol/L (ref 3.5–5.1)
SODIUM: 139 mmol/L (ref 135–145)
Total Bilirubin: 1 mg/dL (ref 0.3–1.2)
Total Protein: 7.2 g/dL (ref 6.5–8.1)

## 2017-04-09 LAB — CBC
HCT: 45.3 % (ref 39.0–52.0)
HEMOGLOBIN: 16.4 g/dL (ref 13.0–17.0)
MCH: 33.5 pg (ref 26.0–34.0)
MCHC: 36.2 g/dL — AB (ref 30.0–36.0)
MCV: 92.4 fL (ref 78.0–100.0)
Platelets: 63 10*3/uL — ABNORMAL LOW (ref 150–400)
RBC: 4.9 MIL/uL (ref 4.22–5.81)
RDW: 14.1 % (ref 11.5–15.5)
WBC: 3.4 10*3/uL — ABNORMAL LOW (ref 4.0–10.5)

## 2017-04-09 LAB — PROTIME-INR
INR: 1.08
Prothrombin Time: 13.9 seconds (ref 11.4–15.2)

## 2017-04-09 LAB — APTT: APTT: 29 s (ref 24–36)

## 2017-04-12 DIAGNOSIS — C61 Malignant neoplasm of prostate: Secondary | ICD-10-CM | POA: Diagnosis not present

## 2017-04-15 ENCOUNTER — Telehealth: Payer: Self-pay | Admitting: *Deleted

## 2017-04-15 NOTE — Telephone Encounter (Signed)
Called patient to remind of implant and space oar for 04-16-17, spoke with patient and he is aware of this procedure.

## 2017-04-16 ENCOUNTER — Encounter (HOSPITAL_COMMUNITY)
Admission: RE | Disposition: A | Discharge status: Home / Self Care | Payer: Self-pay | Source: Ambulatory Visit | Attending: Urology

## 2017-04-16 ENCOUNTER — Ambulatory Visit (HOSPITAL_BASED_OUTPATIENT_CLINIC_OR_DEPARTMENT_OTHER): Payer: Medicare Other | Admitting: Certified Registered"

## 2017-04-16 ENCOUNTER — Observation Stay (HOSPITAL_BASED_OUTPATIENT_CLINIC_OR_DEPARTMENT_OTHER)
Admission: RE | Admit: 2017-04-16 | Discharge: 2017-04-17 | Disposition: A | Discharge status: Home / Self Care | Payer: Medicare Other | Source: Ambulatory Visit | Attending: Urology | Admitting: Urology

## 2017-04-16 ENCOUNTER — Other Ambulatory Visit: Payer: Self-pay

## 2017-04-16 ENCOUNTER — Encounter (HOSPITAL_BASED_OUTPATIENT_CLINIC_OR_DEPARTMENT_OTHER): Payer: Self-pay | Admitting: *Deleted

## 2017-04-16 DIAGNOSIS — C61 Malignant neoplasm of prostate: Secondary | ICD-10-CM | POA: Diagnosis not present

## 2017-04-16 DIAGNOSIS — Z86718 Personal history of other venous thrombosis and embolism: Secondary | ICD-10-CM | POA: Diagnosis not present

## 2017-04-16 DIAGNOSIS — E119 Type 2 diabetes mellitus without complications: Secondary | ICD-10-CM | POA: Diagnosis not present

## 2017-04-16 DIAGNOSIS — I10 Essential (primary) hypertension: Secondary | ICD-10-CM | POA: Diagnosis not present

## 2017-04-16 DIAGNOSIS — F17210 Nicotine dependence, cigarettes, uncomplicated: Secondary | ICD-10-CM | POA: Insufficient documentation

## 2017-04-16 DIAGNOSIS — Z7984 Long term (current) use of oral hypoglycemic drugs: Secondary | ICD-10-CM | POA: Insufficient documentation

## 2017-04-16 DIAGNOSIS — E785 Hyperlipidemia, unspecified: Secondary | ICD-10-CM | POA: Diagnosis not present

## 2017-04-16 HISTORY — DX: Presence of spectacles and contact lenses: Z97.3

## 2017-04-16 HISTORY — DX: Presence of other vascular implants and grafts: Z95.828

## 2017-04-16 HISTORY — DX: Personal history of other venous thrombosis and embolism: Z86.718

## 2017-04-16 HISTORY — DX: Pure hyperglyceridemia: E78.1

## 2017-04-16 HISTORY — DX: Unspecified osteoarthritis, unspecified site: M19.90

## 2017-04-16 HISTORY — PX: SPACE OAR INSTILLATION: SHX6769

## 2017-04-16 HISTORY — DX: Personal history of other diseases of the digestive system: Z87.19

## 2017-04-16 HISTORY — PX: RADIOACTIVE SEED IMPLANT: SHX5150

## 2017-04-16 HISTORY — DX: Poor urinary stream: R39.12

## 2017-04-16 HISTORY — DX: Type 2 diabetes mellitus without complications: E11.9

## 2017-04-16 HISTORY — DX: Other seasonal allergic rhinitis: J30.2

## 2017-04-16 HISTORY — DX: Benign prostatic hyperplasia with lower urinary tract symptoms: N40.1

## 2017-04-16 HISTORY — DX: Thrombocytopenia, unspecified: D69.6

## 2017-04-16 HISTORY — DX: Diverticulosis of large intestine without perforation or abscess without bleeding: K57.30

## 2017-04-16 HISTORY — DX: Fatty (change of) liver, not elsewhere classified: K76.0

## 2017-04-16 LAB — GLUCOSE, CAPILLARY
Glucose-Capillary: 270 mg/dL — ABNORMAL HIGH (ref 65–99)
Glucose-Capillary: 246 mg/dL — ABNORMAL HIGH (ref 65–99)

## 2017-04-16 SURGERY — INSERTION, RADIATION SOURCE, PROSTATE
Anesthesia: General | Site: Prostate

## 2017-04-16 MED ORDER — ONDANSETRON HCL 4 MG/2ML IJ SOLN: 4.0000 mg | INTRAMUSCULAR | Status: DC | PRN

## 2017-04-16 MED ORDER — FENTANYL CITRATE (PF) 100 MCG/2ML IJ SOLN
INTRAMUSCULAR | Status: DC | PRN
  Administered 2017-04-16 (×3): 25 ug via INTRAVENOUS
  Administered 2017-04-16: 50 ug via INTRAVENOUS

## 2017-04-16 MED ORDER — FENTANYL CITRATE (PF) 100 MCG/2ML IJ SOLN
INTRAMUSCULAR | Status: AC
  Filled 2017-04-16: qty 2

## 2017-04-16 MED ORDER — ONDANSETRON HCL 4 MG/2ML IJ SOLN
INTRAMUSCULAR | Status: DC | PRN
  Administered 2017-04-16: 4 mg via INTRAVENOUS

## 2017-04-16 MED ORDER — LISINOPRIL 20 MG PO TABS
20.0000 mg | ORAL_TABLET | Freq: Every day | ORAL | Status: DC
  Administered 2017-04-16: 20 mg via ORAL
  Filled 2017-04-16: qty 1

## 2017-04-16 MED ORDER — ZOLPIDEM TARTRATE 10 MG PO TABS
10.0000 mg | ORAL_TABLET | Freq: Every evening | ORAL | Status: DC | PRN
  Administered 2017-04-16: 10 mg via ORAL
  Filled 2017-04-16: qty 1

## 2017-04-16 MED ORDER — DIPHENHYDRAMINE HCL 50 MG/ML IJ SOLN: 12.5000 mg | Freq: Four times a day (QID) | INTRAMUSCULAR | Status: DC | PRN

## 2017-04-16 MED ORDER — SODIUM CHLORIDE 0.9 % IV SOLN: 250.0000 mL | INTRAVENOUS | Status: DC | PRN

## 2017-04-16 MED ORDER — GENTAMICIN SULFATE 40 MG/ML IJ SOLN
5.0000 mg/kg | Freq: Once | INTRAVENOUS | Status: AC
  Administered 2017-04-16: 440 mg via INTRAVENOUS
  Filled 2017-04-16 (×2): qty 11

## 2017-04-16 MED ORDER — MIDAZOLAM HCL 2 MG/2ML IJ SOLN
INTRAMUSCULAR | Status: DC | PRN
  Administered 2017-04-16: 1 mg via INTRAVENOUS

## 2017-04-16 MED ORDER — SODIUM CHLORIDE 0.9 % IR SOLN
Status: DC | PRN
  Administered 2017-04-16: 1000 mL via INTRAVESICAL

## 2017-04-16 MED ORDER — LIDOCAINE 2% (20 MG/ML) 5 ML SYRINGE
INTRAMUSCULAR | Status: DC | PRN
  Administered 2017-04-16: 60 mg via INTRAVENOUS

## 2017-04-16 MED ORDER — ALPRAZOLAM 0.5 MG PO TABS
0.5000 mg | ORAL_TABLET | Freq: Three times a day (TID) | ORAL | Status: DC | PRN
Start: 2017-04-16 — End: 2017-04-17

## 2017-04-16 MED ORDER — HYDROMORPHONE HCL 1 MG/ML IJ SOLN: 0.5000 mg | INTRAMUSCULAR | Status: DC | PRN

## 2017-04-16 MED ORDER — MIDAZOLAM HCL 2 MG/2ML IJ SOLN
INTRAMUSCULAR | Status: AC
  Filled 2017-04-16: qty 2

## 2017-04-16 MED ORDER — STERILE WATER FOR IRRIGATION IR SOLN
Status: DC | PRN
  Administered 2017-04-16: 3 mL

## 2017-04-16 MED ORDER — FENTANYL CITRATE (PF) 100 MCG/2ML IJ SOLN
25.0000 ug | INTRAMUSCULAR | Status: DC | PRN
  Filled 2017-04-16: qty 1

## 2017-04-16 MED ORDER — LIDOCAINE 2% (20 MG/ML) 5 ML SYRINGE
INTRAMUSCULAR | Status: AC
  Filled 2017-04-16: qty 5

## 2017-04-16 MED ORDER — BELLADONNA ALKALOIDS-OPIUM 16.2-60 MG RE SUPP: 1.0000 | Freq: Four times a day (QID) | RECTAL | Status: DC | PRN

## 2017-04-16 MED ORDER — SODIUM CHLORIDE 0.9% FLUSH: 3.0000 mL | Freq: Two times a day (BID) | INTRAVENOUS | Status: DC

## 2017-04-16 MED ORDER — IOHEXOL 300 MG/ML  SOLN
INTRAMUSCULAR | Status: DC | PRN
  Administered 2017-04-16: 7 mL

## 2017-04-16 MED ORDER — LACTATED RINGERS IV SOLN
INTRAVENOUS | Status: DC
  Administered 2017-04-16 (×2): via INTRAVENOUS
  Filled 2017-04-16: qty 1000

## 2017-04-16 MED ORDER — DIPHENHYDRAMINE HCL 12.5 MG/5ML PO ELIX: 12.5000 mg | ORAL_SOLUTION | Freq: Four times a day (QID) | ORAL | Status: DC | PRN

## 2017-04-16 MED ORDER — PROPOFOL 10 MG/ML IV BOLUS
INTRAVENOUS | Status: DC | PRN
  Administered 2017-04-16: 170 mg via INTRAVENOUS

## 2017-04-16 MED ORDER — OXYCODONE-ACETAMINOPHEN 5-325 MG PO TABS
1.0000 | ORAL_TABLET | ORAL | Status: DC | PRN
  Administered 2017-04-16: 2 via ORAL
  Administered 2017-04-17: 1 via ORAL
  Filled 2017-04-16: qty 1
  Filled 2017-04-16: qty 2

## 2017-04-16 MED ORDER — FENOFIBRATE 160 MG PO TABS
160.0000 mg | ORAL_TABLET | Freq: Every day | ORAL | Status: DC
  Administered 2017-04-16: 160 mg via ORAL
  Filled 2017-04-16: qty 1

## 2017-04-16 MED ORDER — SODIUM CHLORIDE 0.9% FLUSH
3.0000 mL | INTRAVENOUS | Status: DC | PRN
  Administered 2017-04-17: 3 mL via INTRAVENOUS
  Filled 2017-04-16: qty 3

## 2017-04-16 MED ORDER — PRAVASTATIN SODIUM 20 MG PO TABS
20.0000 mg | ORAL_TABLET | Freq: Every day | ORAL | Status: DC
  Administered 2017-04-16: 20 mg via ORAL
  Filled 2017-04-16: qty 1

## 2017-04-16 MED ORDER — FLEET ENEMA 7-19 GM/118ML RE ENEM
1.0000 | ENEMA | Freq: Once | RECTAL | Status: DC
  Filled 2017-04-16: qty 1

## 2017-04-16 MED ORDER — ONDANSETRON HCL 4 MG/2ML IJ SOLN
INTRAMUSCULAR | Status: AC
  Filled 2017-04-16: qty 2

## 2017-04-16 MED ORDER — PREGABALIN 75 MG PO CAPS
150.0000 mg | ORAL_CAPSULE | Freq: Two times a day (BID) | ORAL | Status: DC
  Administered 2017-04-16 – 2017-04-17 (×2): 150 mg via ORAL
  Filled 2017-04-16 (×2): qty 2

## 2017-04-16 SURGICAL SUPPLY — 27 items
BAG URINE DRAINAGE (UROLOGICAL SUPPLIES) ×3 IMPLANT
BLADE CLIPPER SURG (BLADE) ×3 IMPLANT
CATH FOLEY 2WAY SLVR  5CC 16FR (CATHETERS) ×4
CATH FOLEY 2WAY SLVR 5CC 16FR (CATHETERS) ×2 IMPLANT
CATH ROBINSON RED A/P 20FR (CATHETERS) ×3 IMPLANT
CLOTH BEACON ORANGE TIMEOUT ST (SAFETY) ×3 IMPLANT
COVER BACK TABLE 60X90IN (DRAPES) ×3 IMPLANT
COVER MAYO STAND STRL (DRAPES) ×3 IMPLANT
DRSG TEGADERM 4X4.75 (GAUZE/BANDAGES/DRESSINGS) ×4 IMPLANT
DRSG TEGADERM 8X12 (GAUZE/BANDAGES/DRESSINGS) ×4 IMPLANT
GAUZE SPONGE 4X4 12PLY STRL LF (GAUZE/BANDAGES/DRESSINGS) ×2 IMPLANT
GLOVE BIO SURGEON STRL SZ8 (GLOVE) ×3 IMPLANT
GLOVE ECLIPSE 8.0 STRL XLNG CF (GLOVE) ×9 IMPLANT
GOWN STRL REUS W/TWL XL LVL3 (GOWN DISPOSABLE) ×3 IMPLANT
HOLDER FOLEY CATH W/STRAP (MISCELLANEOUS) ×3 IMPLANT
IMPL SPACEOAR SYSTEM 10ML (MISCELLANEOUS) IMPLANT
IMPLANT SPACEOAR SYSTEM 10ML (MISCELLANEOUS) ×3
IV NS 1000ML (IV SOLUTION) ×3
IV NS 1000ML BAXH (IV SOLUTION) ×1 IMPLANT
KIT TURNOVER CYSTO (KITS) ×3 IMPLANT
PACK CYSTO (CUSTOM PROCEDURE TRAY) ×3 IMPLANT
SURGILUBE 2OZ TUBE FLIPTOP (MISCELLANEOUS) ×3 IMPLANT
SUT BONE WAX W31G (SUTURE) ×3 IMPLANT
SYRINGE 10CC LL (SYRINGE) ×6 IMPLANT
UNDERPAD 30X30 (UNDERPADS AND DIAPERS) ×6 IMPLANT
WATER STERILE IRR 500ML POUR (IV SOLUTION) ×3 IMPLANT
selectSeed I-125 ×154 IMPLANT

## 2017-04-16 NOTE — Anesthesia Procedure Notes (Signed)
Procedure Name: LMA Insertion Date/Time: 04/16/2017 1:28 PM Performed by: Suan Halter, CRNA Pre-anesthesia Checklist: Patient identified, Emergency Drugs available, Suction available and Patient being monitored Patient Re-evaluated:Patient Re-evaluated prior to induction Oxygen Delivery Method: Circle system utilized Preoxygenation: Pre-oxygenation with 100% oxygen Induction Type: IV induction Ventilation: Mask ventilation without difficulty LMA: LMA inserted LMA Size: 5.0 Number of attempts: 2 Airway Equipment and Method: Bite block Placement Confirmation: positive ETCO2 Tube secured with: Tape Dental Injury: Teeth and Oropharynx as per pre-operative assessment

## 2017-04-16 NOTE — Transfer of Care (Signed)
Immediate Anesthesia Transfer of Care Note  Patient: [name]  Procedure(s) Performed: Procedure(s) (LRB): RADIOACTIVE SEED IMPLANT/BRACHYTHERAPY IMPLANT (N/A) SPACE OAR INSTILLATION (N/A)  Patient Location: PACU  Anesthesia Type: General  Level of Consciousness: awake, oriented, sedated and patient cooperative  Airway & Oxygen Therapy: Patient Spontanous Breathing and Patient connected to face mask oxygen  Post-op Assessment: Report given to PACU RN and Post -op Vital signs reviewed and stable  Post vital signs: Reviewed and stable  Complications: No apparent anesthesia complications  Last Vitals:  Vitals Value Taken Time  BP 170/89 04/16/2017  3:18 PM  Temp    Pulse 80 04/16/2017  3:22 PM  Resp 23 04/16/2017  3:22 PM  SpO2 95 % 04/16/2017  3:22 PM  Vitals shown include unvalidated device data.  Last Pain:  Vitals:   04/16/17 1200  TempSrc:   PainSc: 0-No pain      Patients Stated Pain Goal: 5 (04/16/17 1200)

## 2017-04-16 NOTE — Discharge Instructions (Signed)
Indwelling Urinary Catheter Care, Adult  Take good care of your catheter to keep it working and to prevent problems.  How to wear your catheter  Attach your catheter to your leg with tape (adhesive tape) or a leg strap. Make sure it is not too tight. If you use tape, remove any bits of tape that are already on the catheter.  How to wear a drainage bag  You should have:   A large overnight bag.   A small leg bag.    Overnight Bag  You may wear the overnight bag at any time. Always keep the bag below the level of your bladder but off the floor. When you sleep, put a clean plastic bag in a wastebasket. Then hang the bag inside the wastebasket.  Leg Bag  Never wear the leg bag at night. Always wear the leg bag below your knee. Keep the leg bag secure with a leg strap or tape.  How to care for your skin   Clean the skin around the catheter at least once every day.   Shower every day. Do not take baths.   Put creams, lotions, or ointments on your genital area only as told by your doctor.   Do not use powders, sprays, or lotions on your genital area.  How to clean your catheter and your skin  1. Wash your hands with soap and water.  2. Wet a washcloth in warm water and gentle (mild) soap.  3. Use the washcloth to clean the skin where the catheter enters your body. Clean downward and wipe away from the catheter in small circles. Do not wipe toward the catheter.  4. Pat the area dry with a clean towel. Make sure to clean off all soap.  How to care for your drainage bags  Empty your drainage bag when it is ?- full or at least 2-3 times a day. Replace your drainage bag once a month or sooner if it starts to smell bad or look dirty. Do not clean your drainage bag unless told by your doctor.  Emptying a drainage bag    Supplies Needed   Rubbing alcohol.   Gauze pad or cotton ball.   Tape or a leg strap.    Steps  1. Wash your hands with soap and water.  2. Separate (detach) the bag from your leg.  3. Hold the bag over  the toilet or a clean container. Keep the bag below your hips and bladder. This stops pee (urine) from going back into the tube.  4. Open the pour spout at the bottom of the bag.  5. Empty the pee into the toilet or container. Do not let the pour spout touch any surface.  6. Put rubbing alcohol on a gauze pad or cotton ball.  7. Use the gauze pad or cotton ball to clean the pour spout.  8. Close the pour spout.  9. Attach the bag to your leg with tape or a leg strap.  10. Wash your hands.    Changing a drainage bag  Supplies Needed   Alcohol wipes.   A clean drainage bag.   Adhesive tape or a leg strap.    Steps  1. Wash your hands with soap and water.  2. Separate the dirty bag from your leg.  3. Pinch the rubber catheter with your fingers so that pee does not spill out.  4. Separate the catheter tube from the drainage tube where these tubes connect (at the   connection valve). Do not let the tubes touch any surface.  5. Clean the end of the catheter tube with an alcohol wipe. Use a different alcohol wipe to clean the end of the drainage tube.  6. Connect the catheter tube to the drainage tube of the clean bag.  7. Attach the new bag to the leg with adhesive tape or a leg strap.  8. Wash your hands.    How to prevent infection and other problems   Never pull on your catheter or try to remove it. Pulling can damage tissue in your body.   Always wash your hands before and after touching your catheter.   If a leg strap gets wet, replace it with a dry one.   Drink enough fluids to keep your pee clear or pale yellow, or as told by your doctor.   Do not let the drainage bag or tubing touch the floor.   Wear cotton underwear.   If you are male, wipe from front to back after you poop (have a bowel movement).   Check on the catheter often to make sure it works and the tubing is not twisted.  Get help if:   Your pee is cloudy.   Your pee smells unusually bad.   Your pee is not draining into the bag.   Your  tube gets clogged.   Your catheter starts to leak.   Your bladder feels full.  Get help right away if:   You have redness, swelling, or pain where the catheter enters your body.   You have fluid, pus, or a bad smell coming from the area where the catheter enters your body.   The area where the catheter enters your body feels warm.   You have a fever.   You have pain in your:  ? Stomach (abdomen).  ? Legs.  ? Lower back.  ? Bladder.   You see blood fill the catheter.   Your pee is pink or red.   You feel sick to your stomach (nauseous).   You throw up (vomit).   You have chills.   Your catheter gets pulled out.  This information is not intended to replace advice given to you by your health care provider. Make sure you discuss any questions you have with your health care provider.  Document Released: 04/18/2012 Document Revised: 11/20/2015 Document Reviewed: 06/06/2013  Elsevier Interactive Patient Education  2018 [employer]

## 2017-04-16 NOTE — Op Note (Signed)
PRE-OPERATIVE DIAGNOSIS:  Adenocarcinoma of the prostate  POST-OPERATIVE DIAGNOSIS:  Same  PROCEDURE:  Procedure(s): 1. I-125 radioactive seed implantation 2. Cystoscopy 3. Placement of SpaceOAR  SURGEON:  Surgeon(s): Nicolette Bang, MD  Radiation oncologist: Dr. Tyler Pita  ANESTHESIA:  General  EBL:  Minimal  DRAINS: 77 French Foley catheter  INDICATION: [name] is a 72 year old with a history of T1c prostate cancer. After discussing treatment options he has elected to proceed with brachytherapy  Description of procedure: After informed consent the patient was brought to the major OR, placed on the table and administered general anesthesia. He was then moved to the modified lithotomy position with his perineum perpendicular to the floor. His perineum and genitalia were then sterilely prepped. An official timeout was then performed. A 16 French Foley catheter was then placed in the bladder and filled with dilute contrast, a rectal tube was placed in the rectum and the transrectal ultrasound probe was placed in the rectum and affixed to the stand. He was then sterilely draped.  Real time ultrasonography was used along with the seed planning software [employer] Prostate vs. 4.2.21. This was used to develop the seed plan including the number of needles as well as number of seeds required for complete and adequate coverage. Real-time ultrasonography was then used along with the previously developed plan and the Nucletron device to implant a total of 77 seeds using 22 needles. This proceeded without difficulty or complication.  We then proceeded to mix the SpaceOAR using the kit supplied from the manufacturer. Once this was complete we placed a sinal needle into the perirectal fat between the rectum and the prostate. Once this was accomplished we injected 2cc of normal saline to hydrodissect the plain. We then instilled the the SpaceOAR through the spinal needle and noted good  distribution in the perirectal fat.   A Foley catheter was then removed as well as the transrectal ultrasound probe and rectal probe. Flexible cystoscopy was then performed using the 17 French flexible scope which revealed a normal urethra throughout its length down to the sphincter which appeared intact. The prostatic urethra revealed bilobar hypertrophy but no evidence of obstruction, seeds, spacers or lesions. The bladder was then entered and fully and systematically inspected. The ureteral orifices were noted to be of normal configuration and position. The mucosa revealed no evidence of tumors. There were also no stones identified within the bladder. I noted no seeds or spacers on the floor of the bladder and retroflexion of the scope revealed no seeds protruding from the base of the prostate.  The cystoscope was then removed and a new 58 French Foley catheter was then inserted and the balloon was filled with 10 cc of sterile water. This was connected to closed system drainage and the patient was awakened and taken to recovery room in stable and satisfactory condition. He tolerated procedure well and there were no intraoperative complications.

## 2017-04-16 NOTE — H&P (Signed)
Urology Admission H&P  Chief Complaint: prostate cancer  History of Present Illness: Mr [name] is a 72yo with a hx of T1c prostate cancer here for therapy. No new LUTS. No hematuria or dysuria. No change in medical history  Past Medical History:  Diagnosis Date  . Anxiety   . Benign localized prostatic hyperplasia with lower urinary tract symptoms (LUTS)   . Depression   . Diverticulosis of colon   . GERD (gastroesophageal reflux disease)   . History of colitis   . History of colonic diverticulitis    03-10-2006 s/p bowel resection   . 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 Hypertension   . Hypertriglyceridemia   . NAFLD (nonalcoholic fatty liver disease)   . OA (osteoarthritis)    knees  . Peripheral neuropathy   . Prostate cancer [facility]) urologist-- dr Alyson Ingles  oncologist- dr Tammi Klippel   dx 12-02-2016-- Stage T1c, Gleason 3+4,  PSA 8.2, 37.8cc-- scheduled for radioactive seed implants 04-16-2017  . S/P insertion of IVC (inferior vena caval) filter 07/02/2006   due to bilateral fermoral dvt  . Seasonal allergies   . Thrombocytopenia ([place])   . Type 2 diabetes mellitus ([place])    followed by pcp  . Weak urinary stream   . Wears glasses    Past Surgical History:  Procedure Laterality Date  . CATARACT EXTRACTION W/ INTRAOCULAR LENS  IMPLANT, BILATERAL  2010  . CHOLECYSTECTOMY OPEN  1973   and APPENDECTOMY  . COLONOSCOPY N/A 06/01/2012   Procedure: COLONOSCOPY;  Surgeon: Rogene [place], MD;  Location: [facility] SUITE;  Service: Endoscopy;  Laterality: N/A;  930-moved to 1025 Ann to notify pt  . IVC FILTER INSERTION  07/02/2006  . PROSTATE BIOPSY  12-02-2016   dr Alyson Ingles office  . SIGMOID RESECTION W/ STAPLED SIDE TO SIDE ANASTOMOSIS/ DRAINAGE ABSCESS  03-10-2006  dr Romona Curls [facility]    Home Medications:  Current Facility-Administered Medications  Medication Dose Route Frequency  Provider Last Rate Last Dose  . gentamicin (GARAMYCIN) 440 mg in dextrose 5 % 100 mL IVPB  5 mg/kg Intravenous Once McKenzie, Candee Furbish, MD      . iohexol (OMNIPAQUE) 300 MG/ML solution    PRN Cleon Gustin, MD   7 mL at 04/16/17 1201  . lactated ringers infusion   Intravenous Continuous Myrtie Soman, MD      . sodium chloride irrigation 0.9 %    PRN Alyson Ingles Candee Furbish, MD   1,000 mL at 04/16/17 1200  . [START ON 04/17/2017] sodium phosphate (FLEET) 7-19 GM/118ML enema 1 enema  1 enema Rectal Once McKenzie, Candee Furbish, MD      . sterile water for irrigation for irrigation    PRN Alyson Ingles Candee Furbish, MD   3 mL at 04/16/17 1201   Allergies:  Allergies  Allergen Reactions  . Metronidazole Nausea And Vomiting    REACTION: causes nausea and vomiting    Family History  Problem Relation Age of Onset  . Cancer Maternal Uncle        colon   Social History:  reports that he has been smoking cigarettes.  He has a 27.00 pack-year smoking history. He has never used smokeless tobacco. He reports that he drinks alcohol. He reports that he does not use drugs.  Review of Systems  All other systems reviewed and are negative.   Physical Exam:  Vital signs in  last 24 hours: Temp:  [98.8 F (37.1 C)] 98.8 F (37.1 C) (04/12 1120) Pulse Rate:  [78] 78 (04/12 1120) Resp:  [16] 16 (04/12 1120) BP: (142)/(83) 142/83 (04/12 1120) SpO2:  [99 %] 99 % (04/12 1120) Weight:  [86.3 kg (190 lb 3.2 oz)] 86.3 kg (190 lb 3.2 oz) (04/12 1120) Physical Exam  Constitutional: He is oriented to person, place, and time. He appears well-developed and well-nourished.  HENT:  Head: Normocephalic and atraumatic.  Eyes: Pupils are equal, round, and reactive to light. EOM are normal.  Neck: Normal range of motion. No thyromegaly present.  Cardiovascular: Normal rate and regular rhythm.  Respiratory: Effort normal. No respiratory distress.  GI: Soft. He exhibits no distension.  Musculoskeletal: Normal range of  motion. He exhibits no edema.  Neurological: He is alert and oriented to person, place, and time.  Skin: Skin is warm and dry.  Psychiatric: He has a normal mood and affect. His behavior is normal. Judgment and thought content normal.    Laboratory Data:  No results found for this or any previous visit (from the past 24 hour(s)). No results found for this or any previous visit (from the past 240 hour(s)). Creatinine: Recent Labs    04/09/17 1416  CREATININE 1.19   Baseline Creatinine: 1.2  Impression/Assessment:  71yo with T1c prostate cancer  Plan:  The risks/benefits/alternatives to brachytherapy with SpaceOAR was explained to the patient and he understands and wishes to proceed with surgery  Nicolette Bang 04/16/2017, 12:25 PM

## 2017-04-16 NOTE — Anesthesia Preprocedure Evaluation (Signed)
Anesthesia Evaluation  Patient identified by MRN, date of birth, ID band Patient awake    Reviewed: Allergy & Precautions, NPO status , Patient's Chart, lab work & pertinent test results  Airway Mallampati: II  TM Distance: >3 FB Neck ROM: Full    Dental  (+) Dental Advisory Given   Pulmonary Current Smoker,    breath sounds clear to auscultation       Cardiovascular hypertension, Pt. on medications + DVT (s/p IVC filter)   Rhythm:Regular Rate:Normal     Neuro/Psych Anxiety Depression  Neuromuscular disease    GI/Hepatic Neg liver ROS, GERD  ,  Endo/Other  diabetes, Type 2, Oral Hypoglycemic Agents  Renal/GU negative Renal ROS     Musculoskeletal  (+) Arthritis ,   Abdominal   Peds  Hematology  (+) Blood dyscrasia (thrombocytopenia), ,   Anesthesia Other Findings   Reproductive/Obstetrics                             Lab Results  Component Value Date   WBC 3.4 (L) 04/09/2017   HGB 16.4 04/09/2017   HCT 45.3 04/09/2017   MCV 92.4 04/09/2017   PLT 63 (L) 04/09/2017   Lab Results  Component Value Date   CREATININE 1.19 04/09/2017   BUN 9 04/09/2017   NA 139 04/09/2017   K 4.9 04/09/2017   CL 102 04/09/2017   CO2 29 04/09/2017    Anesthesia Physical Anesthesia Plan  ASA: III  Anesthesia Plan: General   Post-op Pain Management:    Induction: Intravenous  PONV Risk Score and Plan: 1 and Ondansetron, Dexamethasone and Treatment may vary due to age or medical condition  Airway Management Planned: LMA  Additional Equipment:   Intra-op Plan:   Post-operative Plan: Extubation in OR  Informed Consent: I have reviewed the patients History and Physical, chart, labs and discussed the procedure including the risks, benefits and alternatives for the proposed anesthesia with the patient or authorized representative who has indicated his/her understanding and acceptance.   Dental  advisory given  Plan Discussed with:   Anesthesia Plan Comments:         Anesthesia Quick Evaluation

## 2017-04-17 DIAGNOSIS — C61 Malignant neoplasm of prostate: Secondary | ICD-10-CM | POA: Diagnosis not present

## 2017-04-17 DIAGNOSIS — Z7984 Long term (current) use of oral hypoglycemic drugs: Secondary | ICD-10-CM | POA: Diagnosis not present

## 2017-04-17 DIAGNOSIS — R6889 Other general symptoms and signs: Secondary | ICD-10-CM | POA: Diagnosis not present

## 2017-04-17 DIAGNOSIS — E119 Type 2 diabetes mellitus without complications: Secondary | ICD-10-CM | POA: Diagnosis not present

## 2017-04-17 DIAGNOSIS — Z86718 Personal history of other venous thrombosis and embolism: Secondary | ICD-10-CM | POA: Diagnosis not present

## 2017-04-17 DIAGNOSIS — I10 Essential (primary) hypertension: Secondary | ICD-10-CM | POA: Diagnosis not present

## 2017-04-17 NOTE — Discharge Summary (Signed)
Physician Discharge Summary  Patient ID: [name] MRN: [id number] DOB/AGE: [birth year] 72 y.o.  Admit date: 04/16/2017 Discharge date: 04/17/2017  Admission Diagnoses: Prostate cancer   Discharge Diagnoses:  Active Problems:   Prostate cancer [facility])   Discharged Condition: good  Hospital Course: Admit following brachytherapy for observation. Pt has done well.  His Foley catheter is draining well with clear urine.  He tolerated a regular diet this morning and has been ambulating without difficulty.  Minimal pain.  Consults: None  Significant Diagnostic Studies: none  Treatments: surgery:  1. I-125 radioactive seed implantation 2. Cystoscopy 3. Placement of SpaceOAR   Discharge Exam: Blood pressure 115/64, pulse 61, temperature 98.1 F (36.7 C), temperature source Oral, resp. rate 18, height 6\' 2"  (1.88 m), weight 86.3 kg (190 lb 3.2 oz), SpO2 98 %. No acute distress Neurologic-no focal deficits Respiratory- regular effort and depth Abdomen-soft and nontender Extremities- no calf pain or swelling  Disposition: Discharge disposition: 01-Home or Self Care        Allergies as of 04/17/2017      Reactions   Metronidazole Nausea And Vomiting   REACTION: causes nausea and vomiting      Medication List    TAKE these medications   acetaminophen 500 MG tablet Commonly known as:  TYLENOL Take 1,000 mg by mouth every 6 (six) hours as needed for pain (headache).   ALEVE 220 MG tablet Generic drug:  naproxen sodium Take 220 mg by mouth 2 (two) times daily as needed (headache/pain).   ALPRAZolam 0.5 MG tablet Commonly known as:  XANAX Take 0.5 mg by mouth 3 (three) times daily as needed for anxiety.   calcium carbonate 500 MG chewable tablet Commonly known as:  TUMS - dosed in mg elemental calcium Chew 1 tablet by mouth as needed for indigestion or heartburn.   cetirizine 10 MG tablet Commonly known as:  ZYRTEC Take 10 mg by mouth daily as needed for  allergies.   desvenlafaxine 50 MG 24 hr tablet Commonly known as:  PRISTIQ Take 50 mg by mouth at bedtime.   fenofibrate 160 MG tablet Take 160 mg by mouth at bedtime.   GEMFIBROZIL PO Take 1 tablet by mouth daily.   lisinopril 20 MG tablet Commonly known as:  PRINIVIL,ZESTRIL Take 20 mg by mouth every evening.   loratadine 10 MG tablet Commonly known as:  CLARITIN Take 10 mg by mouth daily as needed for allergies.   LYRICA 150 MG capsule Generic drug:  pregabalin Take 150 mg by mouth 2 (two) times daily.   meloxicam 15 MG tablet Commonly known as:  MOBIC Take 15 mg by mouth daily.   metformin 1000 MG (OSM) 24 hr tablet Commonly known as:  FORTAMET Take 1,000 mg by mouth at bedtime.   pravastatin 20 MG tablet Commonly known as:  PRAVACHOL Take 20 mg by mouth at bedtime.   zolpidem 10 MG tablet Commonly known as:  AMBIEN Take 10 mg by mouth at bedtime as needed for sleep.      Follow-up Information    McKenzie, Candee Furbish, MD. Call on 04/21/2017.   Specialty:  Urology Why:  voiding trial Contact information: [address] [place] [address] [phone]           Signed: Festus Aloe 04/17/2017, 12:01 PM

## 2017-04-19 ENCOUNTER — Encounter (HOSPITAL_BASED_OUTPATIENT_CLINIC_OR_DEPARTMENT_OTHER): Payer: Self-pay | Admitting: Urology

## 2017-04-19 NOTE — Anesthesia Postprocedure Evaluation (Signed)
Anesthesia Post Note  Patient: [name]  Procedure(s) Performed: RADIOACTIVE SEED IMPLANT/BRACHYTHERAPY IMPLANT (N/A Prostate) SPACE OAR INSTILLATION (N/A Prostate)     Patient location during evaluation: PACU Anesthesia Type: General Level of consciousness: awake and alert Pain management: pain level controlled Vital Signs Assessment: post-procedure vital signs reviewed and stable Respiratory status: spontaneous breathing, nonlabored ventilation and respiratory function stable Cardiovascular status: blood pressure returned to baseline and stable Postop Assessment: no apparent nausea or vomiting Anesthetic complications: no    Last Vitals:  Vitals:   04/17/17 0131 04/17/17 0602  BP: 138/80 115/64  Pulse: 73 61  Resp: 18 18  Temp: 36.7 C 36.7 C  SpO2: 97% 98%    Last Pain:  Vitals:   04/17/17 1332  TempSrc:   PainSc: 0-No pain   Pain Goal: Patients Stated Pain Goal: 5 (04/16/17 1200)               Audry Pili

## 2017-04-21 ENCOUNTER — Ambulatory Visit (INDEPENDENT_AMBULATORY_CARE_PROVIDER_SITE_OTHER): Payer: Medicare Other | Admitting: Urology

## 2017-04-21 DIAGNOSIS — C61 Malignant neoplasm of prostate: Secondary | ICD-10-CM

## 2017-04-23 NOTE — Progress Notes (Signed)
  Radiation Oncology         [phone] ________________________________  Name: KHYRI HINZMAN MRN: [id number]  Date: 04/23/2017  DOB: [birth year]       Prostate Seed Implant  FX:JOIT, Edwinna Areola, MD  No ref. provider found  DIAGNOSIS: 72 y.o. gentleman with stage T1c adenocarcinoma of the prostate with a Gleason's score of 4+3 and a PSA of 8.2  PROCEDURE: Insertion of radioactive I-125 seeds into the prostate gland.  RADIATION DOSE: 145 Gy, definitive therapy.  TECHNIQUE: ARVLE GRABE was brought to the operating room with the urologist. He was placed in the dorsolithotomy position. He was catheterized and a rectal tube was inserted. The perineum was shaved, prepped and draped. The ultrasound probe was then introduced into the rectum to see the prostate gland.  TREATMENT DEVICE: A needle grid was attached to the ultrasound probe stand and anchor needles were placed.  3D PLANNING: The prostate was imaged in 3D using a sagittal sweep of the prostate probe. These images were transferred to the planning computer. There, the prostate, urethra and rectum were defined on each axial reconstructed image. Then, the software created an optimized 3D plan and a few seed positions were adjusted. The quality of the plan was reviewed using [facility] information for the target and the following two organs at risk:  Urethra and Rectum.  Then the accepted plan was uploaded to the seed Selectron afterloading unit.  PROSTATE VOLUME STUDY:  Using transrectal ultrasound the volume of the prostate was verified to be 64.9 cc.  SPECIAL TREATMENT PROCEDURE/SUPERVISION AND HANDLING: The Nucletron FIRST system was used to place the needles under sagittal guidance. A total of 22 needles were used to deposit 77 seeds in the prostate gland. The individual seed activity was 0.585 mCi.  SpaceOAR:  Yes  COMPLEX SIMULATION: At the end of the procedure, an anterior radiograph of the pelvis was obtained to document seed  positioning and count. Cystoscopy was performed to check the urethra and bladder.  MICRODOSIMETRY: At the end of the procedure, the patient was emitting 0.13 mR/hr at 1 meter. Accordingly, he was considered safe for hospital discharge.  PLAN: The patient will return to the radiation oncology clinic for post implant CT dosimetry in three weeks.   ________________________________  Sheral Apley Tammi Klippel, M.D.

## 2017-04-29 ENCOUNTER — Telehealth: Payer: Self-pay | Admitting: *Deleted

## 2017-04-29 NOTE — Telephone Encounter (Signed)
CALLED PATIENT TO REMIND OF POST SEED APPTS. AND MRI FOR 04-30-17, SPOKE WITH PATIENT AND HE IS AWARE OF THESE APPTS.

## 2017-04-30 ENCOUNTER — Ambulatory Visit
Admission: RE | Admit: 2017-04-30 | Discharge: 2017-04-30 | Disposition: A | Discharge status: Home / Self Care | Payer: Medicare Other | Source: Ambulatory Visit | Attending: Radiation Oncology | Admitting: Radiation Oncology

## 2017-04-30 ENCOUNTER — Ambulatory Visit (HOSPITAL_COMMUNITY)
Admission: RE | Admit: 2017-04-30 | Discharge: 2017-04-30 | Disposition: A | Discharge status: Home / Self Care | Payer: Medicare Other | Source: Ambulatory Visit | Attending: Urology | Admitting: Urology

## 2017-04-30 ENCOUNTER — Encounter: Payer: Self-pay | Admitting: Radiation Oncology

## 2017-04-30 ENCOUNTER — Encounter: Payer: Self-pay | Admitting: Medical Oncology

## 2017-04-30 VITALS — BP 156/82 | HR 70 | Temp 99.4°F | Resp 17 | Wt 187.8 lb

## 2017-04-30 DIAGNOSIS — C61 Malignant neoplasm of prostate: Secondary | ICD-10-CM

## 2017-04-30 DIAGNOSIS — R197 Diarrhea, unspecified: Secondary | ICD-10-CM | POA: Diagnosis not present

## 2017-04-30 DIAGNOSIS — R634 Abnormal weight loss: Secondary | ICD-10-CM | POA: Insufficient documentation

## 2017-04-30 DIAGNOSIS — Z510 Encounter for antineoplastic radiation therapy: Secondary | ICD-10-CM | POA: Insufficient documentation

## 2017-04-30 DIAGNOSIS — R300 Dysuria: Secondary | ICD-10-CM

## 2017-04-30 DIAGNOSIS — Z7984 Long term (current) use of oral hypoglycemic drugs: Secondary | ICD-10-CM | POA: Insufficient documentation

## 2017-04-30 DIAGNOSIS — Z79899 Other long term (current) drug therapy: Secondary | ICD-10-CM | POA: Insufficient documentation

## 2017-04-30 DIAGNOSIS — Z923 Personal history of irradiation: Secondary | ICD-10-CM | POA: Diagnosis not present

## 2017-04-30 DIAGNOSIS — R3911 Hesitancy of micturition: Secondary | ICD-10-CM | POA: Diagnosis not present

## 2017-04-30 DIAGNOSIS — R630 Anorexia: Secondary | ICD-10-CM | POA: Insufficient documentation

## 2017-04-30 LAB — URINALYSIS, COMPLETE (UACMP) WITH MICROSCOPIC
Bilirubin Urine: NEGATIVE
Ketones, ur: NEGATIVE mg/dL
NITRITE: NEGATIVE
PH: 5 (ref 5.0–8.0)
Protein, ur: 100 mg/dL — AB
RBC / HPF: >50 RBC/hpf — ABNORMAL HIGH (ref 0–5)
Specific Gravity, Urine: 1.024 (ref 1.005–1.030)
WBC, UA: >50 WBC/hpf — ABNORMAL HIGH (ref 0–5)

## 2017-04-30 MED ORDER — CIPROFLOXACIN HCL 500 MG PO TABS: 500.0000 mg | ORAL_TABLET | Freq: Two times a day (BID) | ORAL | 0 refills | Status: DC

## 2017-04-30 NOTE — Progress Notes (Signed)
Patient returns for post seed follow up with Dr. Tammi Klippel. Pre seed IPSS 9. Post seed IPSS 27. 12 lb weight loss noted. Patient reports he has not had an appetite since the seeds were implanted. Reports hematuria resolved just two days ago. Reports dysuria continence. Reports urinary leakage. Seen by his urologist ten days ago for urinary catheter removal. Scheduled to follow up with urology again in July. Scheduled for MRI to confirm SpaceOar placement tonight at 5 pm.   BP (!) 156/82   Pulse 70   Temp 99.4 F (37.4 C) (Oral)   Resp 17   Wt 187 lb 12.8 oz (85.2 kg)   SpO2 94%   BMI 24.11 kg/m    Wt Readings from Last 3 Encounters:  04/30/17 187 lb 12.8 oz (85.2 kg)  04/16/17 190 lb 3.2 oz (86.3 kg)  01/11/17 194 lb (88 kg)

## 2017-04-30 NOTE — Progress Notes (Signed)
  Radiation Oncology         [phone] ________________________________  Name: [name] MRN: [id number]  Date: 04/30/2017  DOB: 03-12-1945  COMPLEX SIMULATION NOTE  NARRATIVE:  The patient was brought to the [facility] today following prostate seed implantation approximately one month ago.  Identity was confirmed.  All relevant records and images related to the planned course of therapy were reviewed.  Then, the patient was set-up supine.  CT images were obtained.  The CT images were loaded into the planning software.  Then the prostate and rectum were contoured.  Treatment planning then occurred.  The implanted iodine 125 seeds were identified by the physics staff for projection of radiation distribution  I have requested : 3D Simulation  I have requested a DVH of the following structures: Prostate and rectum.    ________________________________  Sheral Apley Tammi Klippel, M.D.  This document serves as a record of services personally performed by Tyler Pita, MD. It was created on his behalf by Arlyce Harman, a trained medical scribe. The creation of this record is based on the scribe's personal observations and the provider's statements to them. This document has been checked and approved by the attending provider.

## 2017-04-30 NOTE — Progress Notes (Signed)
Radiation Oncology         [phone] ________________________________  Name: [name] MRN: [id number]  Date: 04/30/2017  DOB: 05/30/1945  Follow-Up Visit Note  CC: Celene Squibb, MD  Celene Squibb, MD  Diagnosis:   72 y.o. gentleman with stage T1c adenocarcinoma of the prostate with a Gleason's score of 4+3 and a PSA of 8.2    ICD-10-CM   1. Malignant neoplasm of prostate ([place]) C61   2. Dysuria R30.0 Urinalysis, Complete w Microscopic    Urine culture    CANCELED: Urine culture    CANCELED: Urinalysis, Routine w reflex microscopic    Interval Since Last Radiation:  2 weeks  Prostate seed implant on 04/16/2017  Narrative:  The patient returns today for routine follow-up.  He is complaining of increased urinary frequency and urinary hesitation symptoms. He filled out a questionnaire regarding urinary function today providing and overall IPSS score of 27 characterizing his symptoms as moderate. His pre-implant score was 9. Reports hematuria resolved two days ago. Reports dysuria continence. Reports urinary leakage. Denies cloudiness or foul odor to urine. Seen by his urologist ten days ago for catheter removal. He does report occasional diarrhea but associates this with colitis and prior colon disease. Scheduled to follow up with urology again in July. Scheduled for MRI to confirm SpaceOar placement tonight at 5 pm. 12 lb weight loss noted. Patient reports he has not had an appetite since the seeds were implanted.   ALLERGIES:  is allergic to metronidazole.  Meds: Current Outpatient Medications  Medication Sig Dispense Refill  . acetaminophen (TYLENOL) 500 MG tablet Take 1,000 mg by mouth every 6 (six) hours as needed for pain (headache).    . ALPRAZolam (XANAX) 0.5 MG tablet Take 0.5 mg by mouth 3 (three) times daily as needed for anxiety.    . calcium carbonate (TUMS - DOSED IN MG ELEMENTAL CALCIUM) 500 MG chewable tablet Chew 1 tablet by mouth as needed for indigestion or  heartburn.    . cetirizine (ZYRTEC) 10 MG tablet Take 10 mg by mouth daily as needed for allergies.    Marland Kitchen desvenlafaxine (PRISTIQ) 50 MG 24 hr tablet Take 50 mg by mouth at bedtime.    . fenofibrate 160 MG tablet Take 160 mg by mouth at bedtime.    Marland Kitchen GEMFIBROZIL PO Take 1 tablet by mouth daily.    Marland Kitchen lisinopril (PRINIVIL,ZESTRIL) 20 MG tablet Take 20 mg by mouth every evening.     . loratadine (CLARITIN) 10 MG tablet Take 10 mg by mouth daily as needed for allergies.    Marland Kitchen LYRICA 150 MG capsule Take 150 mg by mouth 2 (two) times daily.     . meloxicam (MOBIC) 15 MG tablet Take 15 mg by mouth daily.     . metformin (FORTAMET) 1000 MG (OSM) 24 hr tablet Take 1,000 mg by mouth at bedtime.    . naproxen sodium (ALEVE) 220 MG tablet Take 220 mg by mouth 2 (two) times daily as needed (headache/pain).    . pravastatin (PRAVACHOL) 20 MG tablet Take 20 mg by mouth at bedtime.    Marland Kitchen zolpidem (AMBIEN) 10 MG tablet Take 10 mg by mouth at bedtime as needed for sleep.     . ciprofloxacin (CIPRO) 500 MG tablet Take 1 tablet (500 mg total) by mouth 2 (two) times daily for 10 days. 20 tablet 0   No current facility-administered medications for this encounter.     Physical Findings: The patient is  in no acute distress. Patient is alert and oriented.  weight is 187 lb 12.8 oz (85.2 kg). His oral temperature is 99.4 F (37.4 C). His blood pressure is 156/82 (abnormal) and his pulse is 70. His respiration is 17 and oxygen saturation is 94%. .  No significant changes.   Lab Findings: Lab Results  Component Value Date   WBC 3.4 (L) 04/09/2017   HGB 16.4 04/09/2017   HCT 45.3 04/09/2017   MCV 92.4 04/09/2017   PLT 63 (L) 04/09/2017    Radiographic Findings:  Patient underwent CT imaging in our clinic for post implant dosimetry. The CT appears to demonstrate an adequate distribution of radioactive seeds throughout the prostate gland. There no seeds in her near the rectum. I suspect the final radiation plan and  dosimetry will show appropriate coverage of the prostate gland.   Impression: The patient is recovering from the effects of radiation. His urinary symptoms should gradually improve over the next 4-6 months. We talked about this today. He is encouraged by his improvement already and is otherwise please with his outcome.   Plan: Today, I spent time talking to the patient about his prostate seed implant and resolving urinary symptoms. Urinalysis with culture ordered today to check for infection. The patient is scheduled for MRI to confirm SpaceOar placement tonight at 5 pm. We also talked about long-term follow-up for prostate cancer following seed implant. He understands that ongoing PSA determinations and digital rectal exams will help perform surveillance to rule out disease recurrence. He understands what to expect with his PSA measures. Patient was also educated today about some of the long-term effects from radiation including a small risk for rectal bleeding and possibly erectile dysfunction. We talked about some of the general management approaches to these potential complications. However, I did encourage the patient to contact our office or return at any point if he has questions or concerns related to his previous radiation and prostate cancer.  _____________________________________  Sheral Apley. Tammi Klippel, M.D.   ADDENDUM - Patient phoned with lab result and Cipro sent to [facility] -  Urinalysis    Component Value Date/Time   COLORURINE AMBER (A) 04/30/2017 1815   APPEARANCEUR CLOUDY (A) 04/30/2017 1815   LABSPEC 1.024 04/30/2017 1815   PHURINE 5.0 04/30/2017 1815   GLUCOSEU >=500 (A) 04/30/2017 1815   HGBUR LARGE (A) 04/30/2017 1815   BILIRUBINUR NEGATIVE 04/30/2017 1815   KETONESUR NEGATIVE 04/30/2017 1815   PROTEINUR 100 (A) 04/30/2017 1815   UROBILINOGEN 0.2 03/28/2010 1427   NITRITE NEGATIVE 04/30/2017 1815   LEUKOCYTESUR LARGE (A) 04/30/2017 1815      This document serves as a  record of services personally performed by Tyler Pita, MD. It was created on his behalf by Arlyce Harman, a trained medical scribe. The creation of this record is based on the scribe's personal observations and the provider's statements to them. This document has been checked and approved by the attending provider.

## 2017-05-03 ENCOUNTER — Other Ambulatory Visit: Payer: Self-pay | Admitting: Radiation Oncology

## 2017-05-03 DIAGNOSIS — C61 Malignant neoplasm of prostate: Secondary | ICD-10-CM

## 2017-05-03 DIAGNOSIS — R300 Dysuria: Secondary | ICD-10-CM

## 2017-05-03 LAB — URINE CULTURE

## 2017-05-03 MED ORDER — NITROFURANTOIN MONOHYD MACRO 100 MG PO CAPS: 100.0000 mg | ORAL_CAPSULE | Freq: Two times a day (BID) | ORAL | 0 refills | Status: AC

## 2017-05-03 NOTE — Progress Notes (Signed)
[name] post seed implant 04/16/17.He states the implant went well and he was able to spend the night post op. Since discharge, no appetite with a 12 pound weight loss.  Catheter removed 10 days ago and complains with dysuria and frequency. He reported seeing blood until 2 days ago. Dr. Tammi Klippel saw him for follow up, ordered a urinalysis  to rule out UTI. He has follow up with Dr. Alyson Ingles in July.

## 2017-05-04 ENCOUNTER — Telehealth: Payer: Self-pay | Admitting: Radiation Oncology

## 2017-05-04 NOTE — Telephone Encounter (Signed)
Noted that patient had been prescribed macrobid to manage his urinary tract infection. Phoned patient to confirm he had picked up and began taking his medication. Patient confirmed he has done so.

## 2017-05-05 ENCOUNTER — Ambulatory Visit: Payer: Medicare Other | Admitting: Urology

## 2017-05-28 ENCOUNTER — Encounter: Payer: Self-pay | Admitting: Radiation Oncology

## 2017-05-28 ENCOUNTER — Ambulatory Visit: Payer: Medicare Other | Attending: Radiation Oncology | Admitting: Radiation Oncology

## 2017-05-28 DIAGNOSIS — C61 Malignant neoplasm of prostate: Secondary | ICD-10-CM | POA: Insufficient documentation

## 2017-05-28 DIAGNOSIS — Z510 Encounter for antineoplastic radiation therapy: Secondary | ICD-10-CM | POA: Insufficient documentation

## 2017-05-31 NOTE — Progress Notes (Signed)
  Radiation Oncology         [phone] ________________________________  Name: [name] MRN: [id number]  Date: Dec 28, 202019  DOB: February 23, 1945  3D Planning Note   Prostate Brachytherapy Post-Implant Dosimetry  Diagnosis: 72 y.o. gentleman with stage T1c adenocarcinoma of the prostate with a Gleason's score of 4+3 and a PSA of 8.2   Narrative: On a previous date, [name] returned following prostate seed implantation for post implant planning. He underwent CT scan complex simulation to delineate the three-dimensional structures of the pelvis and demonstrate the radiation distribution.  Since that time, the seed localization, and complex isodose planning with dose volume histograms have now been completed.  Results:   Prostate Coverage - The dose of radiation delivered to the 90% or more of the prostate gland (D90) was 114.47% of the prescription dose. This exceeds our goal of greater than 90%. Rectal Sparing - The volume of rectal tissue receiving the prescription dose or higher was 0.0 cc. This falls under our thresholds tolerance of 1.0 cc.  Impression: The prostate seed implant appears to show adequate target coverage and appropriate rectal sparing.  Plan:  The patient will continue to follow with urology for ongoing PSA determinations. I would anticipate a high likelihood for local tumor control with minimal risk for rectal morbidity.  ________________________________  Sheral Apley Tammi Klippel, M.D.

## 2017-06-15 ENCOUNTER — Encounter (INDEPENDENT_AMBULATORY_CARE_PROVIDER_SITE_OTHER): Payer: Self-pay | Admitting: *Deleted

## 2017-07-21 ENCOUNTER — Ambulatory Visit: Payer: Medicare Other | Admitting: Urology

## 2017-07-21 DIAGNOSIS — C61 Malignant neoplasm of prostate: Secondary | ICD-10-CM

## 2017-07-21 DIAGNOSIS — N401 Enlarged prostate with lower urinary tract symptoms: Secondary | ICD-10-CM

## 2017-08-17 DIAGNOSIS — H52203 Unspecified astigmatism, bilateral: Secondary | ICD-10-CM | POA: Diagnosis not present

## 2017-08-17 DIAGNOSIS — E119 Type 2 diabetes mellitus without complications: Secondary | ICD-10-CM | POA: Diagnosis not present

## 2017-08-17 DIAGNOSIS — H524 Presbyopia: Secondary | ICD-10-CM | POA: Diagnosis not present

## 2017-08-17 DIAGNOSIS — H5213 Myopia, bilateral: Secondary | ICD-10-CM | POA: Diagnosis not present

## 2017-08-17 DIAGNOSIS — Z7984 Long term (current) use of oral hypoglycemic drugs: Secondary | ICD-10-CM | POA: Diagnosis not present

## 2017-09-10 DIAGNOSIS — E119 Type 2 diabetes mellitus without complications: Secondary | ICD-10-CM | POA: Diagnosis not present

## 2017-09-10 DIAGNOSIS — Z720 Tobacco use: Secondary | ICD-10-CM | POA: Diagnosis not present

## 2017-09-10 DIAGNOSIS — J309 Allergic rhinitis, unspecified: Secondary | ICD-10-CM | POA: Diagnosis not present

## 2017-09-10 DIAGNOSIS — E782 Mixed hyperlipidemia: Secondary | ICD-10-CM | POA: Diagnosis not present

## 2017-09-13 DIAGNOSIS — Z0001 Encounter for general adult medical examination with abnormal findings: Secondary | ICD-10-CM | POA: Diagnosis not present

## 2017-09-13 DIAGNOSIS — D696 Thrombocytopenia, unspecified: Secondary | ICD-10-CM | POA: Diagnosis not present

## 2017-09-13 DIAGNOSIS — I10 Essential (primary) hypertension: Secondary | ICD-10-CM | POA: Diagnosis not present

## 2017-09-13 DIAGNOSIS — E1165 Type 2 diabetes mellitus with hyperglycemia: Secondary | ICD-10-CM | POA: Diagnosis not present

## 2017-09-13 DIAGNOSIS — G9009 Other idiopathic peripheral autonomic neuropathy: Secondary | ICD-10-CM | POA: Diagnosis not present

## 2017-11-09 ENCOUNTER — Other Ambulatory Visit (HOSPITAL_COMMUNITY): Payer: Self-pay | Admitting: Internal Medicine

## 2017-11-09 ENCOUNTER — Ambulatory Visit (HOSPITAL_COMMUNITY)
Admission: RE | Admit: 2017-11-09 | Discharge: 2017-11-09 | Disposition: A | Discharge status: Home / Self Care | Payer: Medicare Other | Source: Ambulatory Visit | Attending: Internal Medicine | Admitting: Internal Medicine

## 2017-11-09 DIAGNOSIS — J309 Allergic rhinitis, unspecified: Secondary | ICD-10-CM | POA: Diagnosis not present

## 2017-11-09 DIAGNOSIS — J449 Chronic obstructive pulmonary disease, unspecified: Secondary | ICD-10-CM | POA: Diagnosis not present

## 2017-11-09 DIAGNOSIS — R4781 Slurred speech: Secondary | ICD-10-CM

## 2017-11-09 DIAGNOSIS — E119 Type 2 diabetes mellitus without complications: Secondary | ICD-10-CM | POA: Diagnosis not present

## 2017-11-09 DIAGNOSIS — E782 Mixed hyperlipidemia: Secondary | ICD-10-CM | POA: Diagnosis not present

## 2017-11-09 DIAGNOSIS — Z720 Tobacco use: Secondary | ICD-10-CM | POA: Diagnosis not present

## 2017-11-09 DIAGNOSIS — I10 Essential (primary) hypertension: Secondary | ICD-10-CM | POA: Diagnosis not present

## 2017-11-09 DIAGNOSIS — R269 Unspecified abnormalities of gait and mobility: Secondary | ICD-10-CM | POA: Diagnosis not present

## 2017-11-09 DIAGNOSIS — I6782 Cerebral ischemia: Secondary | ICD-10-CM | POA: Diagnosis not present

## 2017-11-09 DIAGNOSIS — R296 Repeated falls: Secondary | ICD-10-CM | POA: Diagnosis not present

## 2017-11-10 ENCOUNTER — Ambulatory Visit: Payer: Medicare Other | Admitting: Urology

## 2017-11-10 DIAGNOSIS — N401 Enlarged prostate with lower urinary tract symptoms: Secondary | ICD-10-CM | POA: Diagnosis not present

## 2017-11-10 DIAGNOSIS — C61 Malignant neoplasm of prostate: Secondary | ICD-10-CM

## 2017-11-16 ENCOUNTER — Ambulatory Visit (HOSPITAL_COMMUNITY): Payer: Medicare Other | Admitting: Internal Medicine

## 2017-12-13 ENCOUNTER — Inpatient Hospital Stay (HOSPITAL_COMMUNITY): Payer: Medicare Other | Attending: Internal Medicine | Admitting: Internal Medicine

## 2017-12-13 ENCOUNTER — Inpatient Hospital Stay (HOSPITAL_COMMUNITY): Payer: Medicare Other

## 2017-12-13 ENCOUNTER — Encounter (HOSPITAL_COMMUNITY): Payer: Self-pay | Admitting: Internal Medicine

## 2017-12-13 ENCOUNTER — Other Ambulatory Visit: Payer: Self-pay

## 2017-12-13 ENCOUNTER — Inpatient Hospital Stay (HOSPITAL_COMMUNITY): Payer: Medicare Other | Admitting: Internal Medicine

## 2017-12-13 VITALS — BP 142/80 | HR 86 | Resp 16 | Ht 72.5 in | Wt 195.0 lb

## 2017-12-13 DIAGNOSIS — K7469 Other cirrhosis of liver: Secondary | ICD-10-CM | POA: Diagnosis not present

## 2017-12-13 DIAGNOSIS — D696 Thrombocytopenia, unspecified: Secondary | ICD-10-CM | POA: Insufficient documentation

## 2017-12-13 DIAGNOSIS — Z8546 Personal history of malignant neoplasm of prostate: Secondary | ICD-10-CM | POA: Diagnosis not present

## 2017-12-13 DIAGNOSIS — I10 Essential (primary) hypertension: Secondary | ICD-10-CM | POA: Insufficient documentation

## 2017-12-13 LAB — COMPREHENSIVE METABOLIC PANEL
ALBUMIN: 3.4 g/dL — AB (ref 3.5–5.0)
ALT: 25 U/L (ref 0–44)
ANION GAP: 8 (ref 5–15)
AST: 36 U/L (ref 15–41)
Alkaline Phosphatase: 97 U/L (ref 38–126)
BILIRUBIN TOTAL: 1.9 mg/dL — AB (ref 0.3–1.2)
BUN: 10 mg/dL (ref 8–23)
CHLORIDE: 99 mmol/L (ref 98–111)
CO2: 27 mmol/L (ref 22–32)
Calcium: 8.3 mg/dL — ABNORMAL LOW (ref 8.9–10.3)
Creatinine, Ser: 1.18 mg/dL (ref 0.61–1.24)
GFR calc Af Amer: >60 mL/min (ref >60)
Glucose, Bld: 278 mg/dL — ABNORMAL HIGH (ref 70–99)
POTASSIUM: 3.6 mmol/L (ref 3.5–5.1)
Sodium: 134 mmol/L — ABNORMAL LOW (ref 135–145)
TOTAL PROTEIN: 6.9 g/dL (ref 6.5–8.1)

## 2017-12-13 LAB — CBC WITH DIFFERENTIAL/PLATELET
Abs Immature Granulocytes: 0.02 10*3/uL (ref 0.00–0.07)
BASOS ABS: 0 10*3/uL (ref 0.0–0.1)
BASOS PCT: 1 %
EOS ABS: 0 10*3/uL (ref 0.0–0.5)
EOS PCT: 1 %
HEMATOCRIT: 42.3 % (ref 39.0–52.0)
Hemoglobin: 14.7 g/dL (ref 13.0–17.0)
IMMATURE GRANULOCYTES: 1 %
Lymphocytes Relative: 14 %
Lymphs Abs: 0.5 10*3/uL — ABNORMAL LOW (ref 0.7–4.0)
MCH: 32.8 pg (ref 26.0–34.0)
MCHC: 34.8 g/dL (ref 30.0–36.0)
MCV: 94.4 fL (ref 80.0–100.0)
Monocytes Absolute: 0.4 10*3/uL (ref 0.1–1.0)
Monocytes Relative: 11 %
NEUTROS PCT: 72 %
NRBC: 0 % (ref 0.0–0.2)
Neutro Abs: 2.4 10*3/uL (ref 1.7–7.7)
PLATELETS: 64 10*3/uL — AB (ref 150–400)
RBC: 4.48 MIL/uL (ref 4.22–5.81)
RDW: 14.4 % (ref 11.5–15.5)
WBC: 3.3 10*3/uL — AB (ref 4.0–10.5)

## 2017-12-13 LAB — FERRITIN: Ferritin: 138 ng/mL (ref 24–336)

## 2017-12-13 LAB — APTT: APTT: 30 s (ref 24–36)

## 2017-12-13 LAB — IRON AND TIBC
Iron: 110 ug/dL (ref 45–182)
Saturation Ratios: 37 % (ref 17.9–39.5)
TIBC: 298 ug/dL (ref 250–450)
UIBC: 188 ug/dL

## 2017-12-13 LAB — LACTATE DEHYDROGENASE: LDH: 142 U/L (ref 98–192)

## 2017-12-13 LAB — TRANSFERRIN: Transferrin: 211 mg/dL (ref 180–329)

## 2017-12-13 LAB — PROTIME-INR
INR: 1.12
PROTHROMBIN TIME: 14.3 s (ref 11.4–15.2)

## 2017-12-13 NOTE — Progress Notes (Signed)
Referring Physician:  Dr. Nevada Crane  Diagnosis Other cirrhosis of liver ([place]) - Plan: CBC with Differential/Platelet, Comprehensive metabolic panel, Lactate dehydrogenase, Ferritin, Protime-INR, APTT, Hepatitis B surface antibody, Hepatitis B surface antigen, Hepatitis B core antibody, total, Hepatitis panel, acute, Hepatitis C RNA quantitative, HIV Antibody (routine testing w rflx), Iron and TIBC, Transferrin Saturation, Hemochromatosis DNA-PCR(c282y,h63d)  Thrombocytopenia (HCC) - Plan: CBC with Differential/Platelet, Comprehensive metabolic panel, Lactate dehydrogenase, Ferritin, Protime-INR, APTT, Hepatitis B surface antibody, Hepatitis B surface antigen, Hepatitis B core antibody, total, Hepatitis panel, acute, Hepatitis C RNA quantitative, HIV Antibody (routine testing w rflx), Iron and TIBC, Transferrin Saturation, Hemochromatosis DNA-PCR(c282y,h63d)  Staging Cancer Staging No matching staging information was found for the patient.  Assessment and Plan:  1.  Thrombocytopenia.   Labs done 04/09/2017 showed WBC 3.4 HB 16.4 plts 63,000.  Chemistries WNl with K+ 4.9 Cr 1.19 and normal LFTs.  Pt 13.9 PTT 29.  Pt had CT abdomen and pelvis done 01/13/2017 that showed  IMPRESSION: 1. No acute process or evidence of metastatic disease in the abdomen or pelvis. 2. Marked cirrhosis, without hepatocellular carcinoma. 3. Coronary artery atherosclerosis. Aortic Atherosclerosis (ICD10-I70.0). 4. Bilateral rib fractures. A left posterior eleventh rib fracture is incompletely healed, may be subacute. This corresponds to the bone scan abnormality.  Pt has been treated with radiation seed implants in 2019.  He denies any bleeding.  Pt is seen today for consultation due to thrombocytopenia.    Labs done 12/13/2017 reviewed and showed WBC 3.3 HB 14.7 plts 64,000.  Chemistries WNL with K+ 3.6 Cr 1.18 and normal LFTs.  Ferritin 138 with transferrin saturation of 37.  PT is 14.3 and PTT 30.    I discussed with pt that  thrombocytopenia likely due to cirrhosis that was noted on recent scan done 01/13/2017.  HEP panel and HIV pending.  He will RTC to go over results.   2.  Cirrhosis.  Pt is followed by Dr. Laural Golden. Hemochromatosis gene evaluation done today.  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   PT 14.3 PTT 30 on labs done today 12/13/2017.     3.  Prostate Cancer.  Pt is followed by Dr. Tammi Klippel.  He had seed implants done.  Recent PSA  Reportedly WNL per pt.  Follow-up with urology as directed.    4  HTN.  BP is 142/80.  Follow-up with PCP.    40 minutes spent with more than 50% spent in review of records, counseling and coordination of care.    HPI:  72 year old male referred for evaluation due to thrombocytopenia.  Labs done 04/09/2017 showed WBC 3.4 HB 16.4 plts 63,000.  Chemistries WNl with K+ 4.9 Cr 1.19 and normal LFTs.  Pt 13.9 PTT 29.  Pt had CT abdomen and pelvis done 01/13/2017 that showed  IMPRESSION: 1. No acute process or evidence of metastatic disease in the abdomen or pelvis. 2. Marked cirrhosis, without hepatocellular carcinoma. 3. Coronary artery atherosclerosis. Aortic Atherosclerosis (ICD10-I70.0). 4. Bilateral rib fractures. A left posterior eleventh rib fracture is incompletely healed, may be subacute. This corresponds to the bone scan abnormality.  Pt has been treated with radiation seed implants in 2019.  He denies any bleeding.  Pt is seen today for consultation due to thrombocytopenia.     Problem List Patient Active Problem List   Diagnosis Date Noted  . Prostate cancer ([place]) [C61] 04/16/2017  . Malignant neoplasm of prostate ([place]) [C61] 01/07/2017  . Cirrhosis of liver without mention of alcohol [K74.60] 08/17/2013  .  Diverticulosis [K57.90] 07/19/2013  . DIARRHEA [R19.7] 08/22/2008  . COLITIS [K52.89] 08/21/2008  . DIVERTICULITIS, COLON [K57.32] 08/21/2008  . DM [E11.9] 08/20/2008  . HYPERLIPIDEMIA [E78.5] 08/20/2008  . SMOKER [F17.200] 08/20/2008  . HYPERTENSION [I10]  08/20/2008  . EXTERNAL HEMORRHOIDS [K64.4] 08/20/2008  . COLONIC POLYPS, ADENOMATOUS, HX OF [Z86.010] 08/20/2008    Past Medical History Past Medical History:  Diagnosis Date  . Anxiety   . Benign localized prostatic hyperplasia with lower urinary tract symptoms (LUTS)   . Depression   . Diverticulosis of colon   . GERD (gastroesophageal reflux disease)   . History of colitis   . History of colonic diverticulitis    03-10-2006 s/p bowel resection   . 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 Hypertension   . Hypertriglyceridemia   . NAFLD (nonalcoholic fatty liver disease)   . OA (osteoarthritis)    knees  . Peripheral neuropathy   . Prostate cancer [facility]) urologist-- dr Alyson Ingles  oncologist- dr Tammi Klippel   dx 12-02-2016-- Stage T1c, Gleason 3+4,  PSA 8.2, 37.8cc-- scheduled for radioactive seed implants 04-16-2017  . S/P insertion of IVC (inferior vena caval) filter 07/02/2006   due to bilateral fermoral dvt  . Seasonal allergies   . Thrombocytopenia ([place])   . Type 2 diabetes mellitus ([place])    followed by pcp  . Weak urinary stream   . Wears glasses     Past Surgical History Past Surgical History:  Procedure Laterality Date  . CATARACT EXTRACTION W/ INTRAOCULAR LENS  IMPLANT, BILATERAL  2010  . CHOLECYSTECTOMY OPEN  1973   and APPENDECTOMY  . COLONOSCOPY N/A 06/01/2012   Procedure: COLONOSCOPY;  Surgeon: Rogene [place], MD;  Location: [facility];  Service: Endoscopy;  Laterality: N/A;  930-moved to 1025 [address] to notify pt  . IVC FILTER INSERTION  07/02/2006  . PROSTATE BIOPSY  12-02-2016   dr Alyson Ingles office  . RADIOACTIVE SEED IMPLANT N/A 04/16/2017   Procedure: RADIOACTIVE SEED IMPLANT/BRACHYTHERAPY IMPLANT;  Surgeon: Cleon Gustin, MD;  Location: [facility];  Service: Urology;  Laterality: N/A;  . SIGMOID RESECTION W/ STAPLED SIDE TO SIDE ANASTOMOSIS/  DRAINAGE ABSCESS  03-10-2006  dr bradford [facility]  . SPACE OAR INSTILLATION N/A 04/16/2017   Procedure: SPACE OAR INSTILLATION;  Surgeon: Cleon Gustin, MD;  Location: [facility];  Service: Urology;  Laterality: N/A;    Family History Family History  Problem Relation Age of Onset  . Cancer Maternal Uncle        colon     Social History  reports that he has been smoking cigarettes. He has a 27.00 pack-year smoking history. He has never used smokeless tobacco. He reports that he drinks alcohol. He reports that he does not use drugs.  Medications  Current Outpatient Medications:  .  acetaminophen (TYLENOL) 500 MG tablet, Take 1,000 mg by mouth every 6 (six) hours as needed for pain (headache)., Disp: , Rfl:  .  ALPRAZolam (XANAX) 0.5 MG tablet, Take 0.5 mg by mouth 3 (three) times daily as needed for anxiety., Disp: , Rfl:  .  calcium carbonate (TUMS - DOSED IN MG ELEMENTAL CALCIUM) 500 MG chewable tablet, Chew 1 tablet by mouth as needed for indigestion or heartburn., Disp: , Rfl:  .  cetirizine (ZYRTEC) 10 MG tablet, Take 10 mg by mouth daily as needed for allergies., Disp: , Rfl:  .  desvenlafaxine (PRISTIQ) 50 MG 24 hr tablet, Take 50 mg by mouth at bedtime., Disp: , Rfl:  .  fenofibrate 160 MG tablet, Take 160 mg by mouth at bedtime., Disp: , Rfl:  .  GEMFIBROZIL PO, Take 1 tablet by mouth daily., Disp: , Rfl:  .  lisinopril (PRINIVIL,ZESTRIL) 20 MG tablet, Take 20 mg by mouth every evening. , Disp: , Rfl:  .  loratadine (CLARITIN) 10 MG tablet, Take 10 mg by mouth daily as needed for allergies., Disp: , Rfl:  .  LYRICA 150 MG capsule, Take 150 mg by mouth 2 (two) times daily. , Disp: , Rfl:  .  metformin (FORTAMET) 1000 MG (OSM) 24 hr tablet, Take 1,000 mg by mouth at bedtime., Disp: , Rfl:  .  naproxen sodium (ALEVE) 220 MG tablet, Take 220 mg by mouth 2 (two) times daily as needed (headache/pain)., Disp: , Rfl:  .  pravastatin (PRAVACHOL) 20 MG tablet, Take 20 mg  by mouth at bedtime., Disp: , Rfl:  .  zolpidem (AMBIEN) 10 MG tablet, Take 10 mg by mouth at bedtime as needed for sleep. , Disp: , Rfl:   Allergies Metronidazole  Review of Systems Review of Systems - Oncology ROS negative   Physical Exam  Vitals Wt Readings from Last 3 Encounters:  12/13/17 195 lb (88.5 kg)  04/30/17 187 lb 12.8 oz (85.2 kg)  04/16/17 190 lb 3.2 oz (86.3 kg)   Temp Readings from Last 3 Encounters:  04/30/17 99.4 F (37.4 C) (Oral)  04/17/17 98.1 F (36.7 C) (Oral)   BP Readings from Last 3 Encounters:  12/13/17 (!) 142/80  04/30/17 (!) 156/82  04/17/17 115/64   Pulse Readings from Last 3 Encounters:  12/13/17 86  04/30/17 70  04/17/17 61   Constitutional: Well-developed, well-nourished, and in no distress.   HENT: Head: Normocephalic and atraumatic.  Mouth/Throat: No oropharyngeal exudate. Mucosa moist. Eyes: Pupils are equal, round, and reactive to light. Conjunctivae are normal. No scleral icterus.  Neck: Normal range of motion. Neck supple. No JVD present.  Cardiovascular: Normal rate, regular rhythm and normal heart sounds.  Exam reveals no gallop and no friction rub.   No murmur heard. Pulmonary/Chest: Effort normal and breath sounds normal. No respiratory distress. No wheezes.No rales.  Abdominal: Soft. Bowel sounds are normal. No distension. There is no tenderness. There is no guarding.  Musculoskeletal: No edema or tenderness.  Lymphadenopathy: No cervical,axillary or supraclavicular adenopathy.  Neurological: Alert and oriented to person, place, and time. No cranial nerve deficit.  Skin: Skin is warm and dry. No rash noted. No erythema. No pallor.  Psychiatric: Affect and judgment normal.   Labs Lab on 12/13/2017  Component Date Value Ref Range Status  . WBC 12/13/2017 3.3* 4.0 - 10.5 K/uL Final  . RBC 12/13/2017 4.48  4.22 - 5.81 MIL/uL Final  . Hemoglobin 12/13/2017 14.7  13.0 - 17.0 g/dL Final  . HCT 12/13/2017 42.3  39.0 - 52.0  % Final  . MCV 12/13/2017 94.4  80.0 - 100.0 fL Final  . MCH 12/13/2017 32.8  26.0 - 34.0 pg Final  . MCHC 12/13/2017 34.8  30.0 - 36.0 g/dL Final  . RDW 12/13/2017 14.4  11.5 - 15.5 % Final  . Platelets 12/13/2017 64* 150 - 400 K/uL Final   Comment: PLATELET COUNT CONFIRMED BY SMEAR SPECIMEN CHECKED FOR CLOTS Immature Platelet Fraction may be clinically indicated, consider ordering this additional test [id number]   . nRBC 12/13/2017 0.0  0.0 - 0.2 % Final  .  Neutrophils Relative % 12/13/2017 72  % Final  . Neutro Abs 12/13/2017 2.4  1.7 - 7.7 K/uL Final  . Lymphocytes Relative 12/13/2017 14  % Final  . Lymphs Abs 12/13/2017 0.5* 0.7 - 4.0 K/uL Final  . Monocytes Relative 12/13/2017 11  % Final  . Monocytes Absolute 12/13/2017 0.4  0.1 - 1.0 K/uL Final  . Eosinophils Relative 12/13/2017 1  % Final  . Eosinophils Absolute 12/13/2017 0.0  0.0 - 0.5 K/uL Final  . Basophils Relative 12/13/2017 1  % Final  . Basophils Absolute 12/13/2017 0.0  0.0 - 0.1 K/uL Final  . Immature Granulocytes 12/13/2017 1  % Final  . Abs Immature Granulocytes 12/13/2017 0.02  0.00 - 0.07 K/uL Final   Performed at [facility], [address] [place], [place] [address]  . Sodium 12/13/2017 134* 135 - 145 mmol/L Final  . Potassium 12/13/2017 3.6  3.5 - 5.1 mmol/L Final  . Chloride 12/13/2017 99  98 - 111 mmol/L Final  . CO2 12/13/2017 27  22 - 32 mmol/L Final  . Glucose, Bld 12/13/2017 278* 70 - 99 mg/dL Final  . BUN 12/13/2017 10  8 - 23 mg/dL Final  . Creatinine, Ser 12/13/2017 1.18  0.61 - 1.24 mg/dL Final  . Calcium 12/13/2017 8.3* 8.9 - 10.3 mg/dL Final  . Total Protein 12/13/2017 6.9  6.5 - 8.1 g/dL Final  . Albumin 12/13/2017 3.4* 3.5 - 5.0 g/dL Final  . AST 12/13/2017 36  15 - 41 U/L Final  . ALT 12/13/2017 25  0 - 44 U/L Final  . Alkaline Phosphatase 12/13/2017 97  38 - 126 U/L Final  . Total Bilirubin 12/13/2017 1.9* 0.3 - 1.2 mg/dL Final  . GFR calc non Af Amer 12/13/2017 >60  >60 mL/min Final   . GFR calc Af Amer 12/13/2017 >60  >60 mL/min Final  . Anion gap 12/13/2017 8  5 - 15 Final   Performed at [facility], [address], [place], [place] [address]  . LDH 12/13/2017 142  98 - 192 U/L Final   Performed at [facility], [address] [place], [place] [address]  . Ferritin 12/13/2017 138  24 - 336 ng/mL Final   Performed at [facility], [address] [place], [place] [address]  . Prothrombin Time 12/13/2017 14.3  11.4 - 15.2 seconds Final  . INR 12/13/2017 1.12   Final   Performed at [facility], [address] [place], [place] [address]  . aPTT 12/13/2017 30  24 - 36 seconds Final   Performed at [facility], [address], [place], [place] [address]  . Iron 12/13/2017 110  45 - 182 ug/dL Final  . TIBC 12/13/2017 298  250 - 450 ug/dL Final  . Saturation Ratios 12/13/2017 37  17.9 - 39.5 % Final  . UIBC 12/13/2017 188  ug/dL Final   Performed at [facility], [address] [place], [place] [address]  . Transferrin 12/13/2017 211  180 - 329 mg/dL Final   Performed at [facility], [address], [place], [place] [address]     Pathology Orders Placed This Encounter  Procedures  . CBC with Differential/Platelet    Standing Status:   Future    Number of Occurrences:   1    Standing Expiration Date:   12/14/2018  . Comprehensive metabolic panel    Standing Status:   Future    Number of Occurrences:   1    Standing Expiration Date:   12/14/2018  .  Lactate dehydrogenase    Standing Status:   Future    Number of Occurrences:   1    Standing Expiration Date:   12/14/2018  . Ferritin    Standing Status:   Future    Number of Occurrences:   1    Standing Expiration Date:   12/14/2018  . Protime-INR    Standing Status:   Future    Number of Occurrences:   1    Standing Expiration Date:   12/14/2018  . APTT    Standing Status:   Future    Number of Occurrences:   1    Standing Expiration Date:   12/14/2018  . Hepatitis B surface antibody    Standing Status:   Future     Number of Occurrences:   1    Standing Expiration Date:   12/14/2018  . Hepatitis B surface antigen    Standing Status:   Future    Number of Occurrences:   1    Standing Expiration Date:   12/14/2018  . Hepatitis B core antibody, total    Standing Status:   Future    Number of Occurrences:   1    Standing Expiration Date:   12/14/2018  . Hepatitis panel, acute    Standing Status:   Future    Number of Occurrences:   1    Standing Expiration Date:   12/14/2018  . Hepatitis C RNA quantitative    Standing Status:   Future    Number of Occurrences:   1    Standing Expiration Date:   12/14/2018  . HIV Antibody (routine testing w rflx)    Standing Status:   Future    Number of Occurrences:   1    Standing Expiration Date:   12/14/2018  . Iron and TIBC    Standing Status:   Future    Number of Occurrences:   1    Standing Expiration Date:   12/14/2018  . Transferrin Saturation    Standing Status:   Future    Number of Occurrences:   1    Standing Expiration Date:   12/14/2018  . Hemochromatosis DNA-PCR(c282y,h63d)    Standing Status:   Future    Number of Occurrences:   1    Standing Expiration Date:   12/14/2018       Zoila Shutter MD

## 2017-12-14 LAB — HEPATITIS PANEL, ACUTE
HEP A IGM: NEGATIVE
HEP B S AG: NEGATIVE
Hep B C IgM: NEGATIVE

## 2017-12-14 LAB — HIV ANTIBODY (ROUTINE TESTING W REFLEX): HIV Screen 4th Generation wRfx: NONREACTIVE

## 2017-12-14 LAB — HEPATITIS B SURFACE ANTIGEN: HEP B S AG: NEGATIVE

## 2017-12-14 LAB — HEPATITIS B SURFACE ANTIBODY,QUALITATIVE: HEP B S AB: REACTIVE

## 2017-12-14 LAB — HEPATITIS B CORE ANTIBODY, TOTAL: HEP B C TOTAL AB: NEGATIVE

## 2017-12-15 DIAGNOSIS — I10 Essential (primary) hypertension: Secondary | ICD-10-CM | POA: Diagnosis not present

## 2017-12-15 DIAGNOSIS — J449 Chronic obstructive pulmonary disease, unspecified: Secondary | ICD-10-CM | POA: Diagnosis not present

## 2017-12-15 DIAGNOSIS — E119 Type 2 diabetes mellitus without complications: Secondary | ICD-10-CM | POA: Diagnosis not present

## 2017-12-15 DIAGNOSIS — E782 Mixed hyperlipidemia: Secondary | ICD-10-CM | POA: Diagnosis not present

## 2017-12-16 ENCOUNTER — Ambulatory Visit (HOSPITAL_COMMUNITY): Payer: Medicare Other | Admitting: Internal Medicine

## 2017-12-16 LAB — HEMOCHROMATOSIS DNA-PCR(C282Y,H63D)

## 2017-12-31 ENCOUNTER — Ambulatory Visit (HOSPITAL_COMMUNITY): Payer: Medicare Other | Admitting: Internal Medicine

## 2018-01-17 ENCOUNTER — Ambulatory Visit: Payer: Medicare Other | Admitting: Neurology

## 2018-01-21 DIAGNOSIS — E119 Type 2 diabetes mellitus without complications: Secondary | ICD-10-CM | POA: Diagnosis not present

## 2018-01-21 DIAGNOSIS — E782 Mixed hyperlipidemia: Secondary | ICD-10-CM | POA: Diagnosis not present

## 2018-01-21 DIAGNOSIS — I10 Essential (primary) hypertension: Secondary | ICD-10-CM | POA: Diagnosis not present

## 2018-01-21 DIAGNOSIS — J449 Chronic obstructive pulmonary disease, unspecified: Secondary | ICD-10-CM | POA: Diagnosis not present

## 2018-02-14 DIAGNOSIS — E782 Mixed hyperlipidemia: Secondary | ICD-10-CM | POA: Diagnosis not present

## 2018-02-14 DIAGNOSIS — E119 Type 2 diabetes mellitus without complications: Secondary | ICD-10-CM | POA: Diagnosis not present

## 2018-02-14 DIAGNOSIS — E1165 Type 2 diabetes mellitus with hyperglycemia: Secondary | ICD-10-CM | POA: Diagnosis not present

## 2018-02-16 ENCOUNTER — Ambulatory Visit: Payer: Medicare Other | Admitting: Urology

## 2018-02-16 DIAGNOSIS — C61 Malignant neoplasm of prostate: Secondary | ICD-10-CM | POA: Diagnosis not present

## 2018-02-16 DIAGNOSIS — N401 Enlarged prostate with lower urinary tract symptoms: Secondary | ICD-10-CM

## 2018-03-07 ENCOUNTER — Other Ambulatory Visit: Payer: Self-pay

## 2018-03-07 NOTE — Patient Outreach (Signed)
[facility]) Care Management  03/07/2018  [name] 07/27/1945 [id number]   Medication Adherence call to [name] left a message for patient to call back patient is due on Pravastatin 20 mg, Lisinopril 20 mg and Metformin 1000. [name] is showing past due under [place].   [facility] Direct Dial [phone]  Fax [phone] [name]

## 2018-03-16 DIAGNOSIS — I10 Essential (primary) hypertension: Secondary | ICD-10-CM | POA: Diagnosis not present

## 2018-03-16 DIAGNOSIS — E1165 Type 2 diabetes mellitus with hyperglycemia: Secondary | ICD-10-CM | POA: Diagnosis not present

## 2018-03-16 DIAGNOSIS — E119 Type 2 diabetes mellitus without complications: Secondary | ICD-10-CM | POA: Diagnosis not present

## 2018-03-16 DIAGNOSIS — E782 Mixed hyperlipidemia: Secondary | ICD-10-CM | POA: Diagnosis not present

## 2018-03-21 DIAGNOSIS — J309 Allergic rhinitis, unspecified: Secondary | ICD-10-CM | POA: Diagnosis not present

## 2018-03-21 DIAGNOSIS — I10 Essential (primary) hypertension: Secondary | ICD-10-CM | POA: Diagnosis not present

## 2018-03-21 DIAGNOSIS — D696 Thrombocytopenia, unspecified: Secondary | ICD-10-CM | POA: Diagnosis not present

## 2018-03-21 DIAGNOSIS — E782 Mixed hyperlipidemia: Secondary | ICD-10-CM | POA: Diagnosis not present

## 2018-03-21 DIAGNOSIS — E1140 Type 2 diabetes mellitus with diabetic neuropathy, unspecified: Secondary | ICD-10-CM | POA: Diagnosis not present

## 2018-03-25 DIAGNOSIS — E119 Type 2 diabetes mellitus without complications: Secondary | ICD-10-CM | POA: Diagnosis not present

## 2018-03-25 DIAGNOSIS — E782 Mixed hyperlipidemia: Secondary | ICD-10-CM | POA: Diagnosis not present

## 2018-03-25 DIAGNOSIS — I10 Essential (primary) hypertension: Secondary | ICD-10-CM | POA: Diagnosis not present

## 2018-04-04 DIAGNOSIS — M545 Low back pain: Secondary | ICD-10-CM | POA: Diagnosis not present

## 2018-04-29 DIAGNOSIS — E119 Type 2 diabetes mellitus without complications: Secondary | ICD-10-CM | POA: Diagnosis not present

## 2018-04-29 DIAGNOSIS — I10 Essential (primary) hypertension: Secondary | ICD-10-CM | POA: Diagnosis not present

## 2018-04-29 DIAGNOSIS — E782 Mixed hyperlipidemia: Secondary | ICD-10-CM | POA: Diagnosis not present

## 2018-04-29 DIAGNOSIS — J449 Chronic obstructive pulmonary disease, unspecified: Secondary | ICD-10-CM | POA: Diagnosis not present

## 2018-05-04 ENCOUNTER — Inpatient Hospital Stay (HOSPITAL_COMMUNITY)
Admission: EM | Admit: 2018-05-04 | Discharge: 2018-05-24 | DRG: 082 | Disposition: A | Discharge status: Hospice | Payer: Medicare Other | Attending: Internal Medicine | Admitting: Internal Medicine

## 2018-05-04 ENCOUNTER — Emergency Department (HOSPITAL_COMMUNITY): Payer: Medicare Other

## 2018-05-04 ENCOUNTER — Other Ambulatory Visit: Payer: Self-pay

## 2018-05-04 ENCOUNTER — Encounter (HOSPITAL_COMMUNITY): Payer: Self-pay

## 2018-05-04 ENCOUNTER — Observation Stay (HOSPITAL_COMMUNITY): Payer: Medicare Other

## 2018-05-04 ENCOUNTER — Inpatient Hospital Stay (HOSPITAL_COMMUNITY): Payer: Medicare Other

## 2018-05-04 DIAGNOSIS — F10231 Alcohol dependence with withdrawal delirium: Secondary | ICD-10-CM | POA: Diagnosis present

## 2018-05-04 DIAGNOSIS — G92 Toxic encephalopathy: Secondary | ICD-10-CM | POA: Diagnosis present

## 2018-05-04 DIAGNOSIS — Y907 Blood alcohol level of 200-239 mg/100 ml: Secondary | ICD-10-CM | POA: Diagnosis present

## 2018-05-04 DIAGNOSIS — I6200 Nontraumatic subdural hemorrhage, unspecified: Secondary | ICD-10-CM | POA: Diagnosis not present

## 2018-05-04 DIAGNOSIS — F05 Delirium due to known physiological condition: Secondary | ICD-10-CM | POA: Diagnosis not present

## 2018-05-04 DIAGNOSIS — S066X9A Traumatic subarachnoid hemorrhage with loss of consciousness of unspecified duration, initial encounter: Secondary | ICD-10-CM | POA: Diagnosis not present

## 2018-05-04 DIAGNOSIS — K7460 Unspecified cirrhosis of liver: Secondary | ICD-10-CM | POA: Diagnosis present

## 2018-05-04 DIAGNOSIS — E1142 Type 2 diabetes mellitus with diabetic polyneuropathy: Secondary | ICD-10-CM | POA: Diagnosis not present

## 2018-05-04 DIAGNOSIS — S06360D Traumatic hemorrhage of cerebrum, unspecified, without loss of consciousness, subsequent encounter: Secondary | ICD-10-CM | POA: Diagnosis not present

## 2018-05-04 DIAGNOSIS — J811 Chronic pulmonary edema: Secondary | ICD-10-CM | POA: Diagnosis not present

## 2018-05-04 DIAGNOSIS — R000 Tachycardia, unspecified: Secondary | ICD-10-CM | POA: Diagnosis not present

## 2018-05-04 DIAGNOSIS — F17210 Nicotine dependence, cigarettes, uncomplicated: Secondary | ICD-10-CM | POA: Diagnosis present

## 2018-05-04 DIAGNOSIS — F419 Anxiety disorder, unspecified: Secondary | ICD-10-CM | POA: Diagnosis present

## 2018-05-04 DIAGNOSIS — S0003XA Contusion of scalp, initial encounter: Secondary | ICD-10-CM | POA: Diagnosis not present

## 2018-05-04 DIAGNOSIS — Z86718 Personal history of other venous thrombosis and embolism: Secondary | ICD-10-CM

## 2018-05-04 DIAGNOSIS — Z781 Physical restraint status: Secondary | ICD-10-CM

## 2018-05-04 DIAGNOSIS — R0603 Acute respiratory distress: Secondary | ICD-10-CM | POA: Diagnosis present

## 2018-05-04 DIAGNOSIS — Z515 Encounter for palliative care: Secondary | ICD-10-CM

## 2018-05-04 DIAGNOSIS — S065XAA Traumatic subdural hemorrhage with loss of consciousness status unknown, initial encounter: Secondary | ICD-10-CM | POA: Diagnosis present

## 2018-05-04 DIAGNOSIS — R0902 Hypoxemia: Secondary | ICD-10-CM | POA: Diagnosis not present

## 2018-05-04 DIAGNOSIS — Z09 Encounter for follow-up examination after completed treatment for conditions other than malignant neoplasm: Secondary | ICD-10-CM

## 2018-05-04 DIAGNOSIS — S065X0A Traumatic subdural hemorrhage without loss of consciousness, initial encounter: Secondary | ICD-10-CM | POA: Diagnosis not present

## 2018-05-04 DIAGNOSIS — R402411 Glasgow coma scale score 13-15, in the field [EMT or ambulance]: Secondary | ICD-10-CM | POA: Diagnosis not present

## 2018-05-04 DIAGNOSIS — E785 Hyperlipidemia, unspecified: Secondary | ICD-10-CM | POA: Diagnosis present

## 2018-05-04 DIAGNOSIS — Z8719 Personal history of other diseases of the digestive system: Secondary | ICD-10-CM

## 2018-05-04 DIAGNOSIS — R1312 Dysphagia, oropharyngeal phase: Secondary | ICD-10-CM | POA: Diagnosis present

## 2018-05-04 DIAGNOSIS — I629 Nontraumatic intracranial hemorrhage, unspecified: Secondary | ICD-10-CM | POA: Diagnosis not present

## 2018-05-04 DIAGNOSIS — S12100A Unspecified displaced fracture of second cervical vertebra, initial encounter for closed fracture: Secondary | ICD-10-CM

## 2018-05-04 DIAGNOSIS — F1010 Alcohol abuse, uncomplicated: Secondary | ICD-10-CM | POA: Diagnosis present

## 2018-05-04 DIAGNOSIS — F329 Major depressive disorder, single episode, unspecified: Secondary | ICD-10-CM | POA: Diagnosis present

## 2018-05-04 DIAGNOSIS — R404 Transient alteration of awareness: Secondary | ICD-10-CM | POA: Diagnosis not present

## 2018-05-04 DIAGNOSIS — J302 Other seasonal allergic rhinitis: Secondary | ICD-10-CM | POA: Diagnosis present

## 2018-05-04 DIAGNOSIS — E119 Type 2 diabetes mellitus without complications: Secondary | ICD-10-CM

## 2018-05-04 DIAGNOSIS — R0682 Tachypnea, not elsewhere classified: Secondary | ICD-10-CM

## 2018-05-04 DIAGNOSIS — Z7189 Other specified counseling: Secondary | ICD-10-CM | POA: Diagnosis not present

## 2018-05-04 DIAGNOSIS — J90 Pleural effusion, not elsewhere classified: Secondary | ICD-10-CM | POA: Diagnosis not present

## 2018-05-04 DIAGNOSIS — Z7401 Bed confinement status: Secondary | ICD-10-CM

## 2018-05-04 DIAGNOSIS — R627 Adult failure to thrive: Secondary | ICD-10-CM | POA: Diagnosis present

## 2018-05-04 DIAGNOSIS — S065X0D Traumatic subdural hemorrhage without loss of consciousness, subsequent encounter: Secondary | ICD-10-CM | POA: Diagnosis not present

## 2018-05-04 DIAGNOSIS — R296 Repeated falls: Secondary | ICD-10-CM | POA: Diagnosis present

## 2018-05-04 DIAGNOSIS — E11649 Type 2 diabetes mellitus with hypoglycemia without coma: Secondary | ICD-10-CM | POA: Diagnosis not present

## 2018-05-04 DIAGNOSIS — D689 Coagulation defect, unspecified: Secondary | ICD-10-CM | POA: Diagnosis not present

## 2018-05-04 DIAGNOSIS — I609 Nontraumatic subarachnoid hemorrhage, unspecified: Secondary | ICD-10-CM | POA: Diagnosis not present

## 2018-05-04 DIAGNOSIS — R451 Restlessness and agitation: Secondary | ICD-10-CM | POA: Diagnosis not present

## 2018-05-04 DIAGNOSIS — Z6823 Body mass index (BMI) 23.0-23.9, adult: Secondary | ICD-10-CM

## 2018-05-04 DIAGNOSIS — F10929 Alcohol use, unspecified with intoxication, unspecified: Secondary | ICD-10-CM | POA: Diagnosis not present

## 2018-05-04 DIAGNOSIS — I10 Essential (primary) hypertension: Secondary | ICD-10-CM | POA: Diagnosis not present

## 2018-05-04 DIAGNOSIS — D6959 Other secondary thrombocytopenia: Secondary | ICD-10-CM | POA: Diagnosis present

## 2018-05-04 DIAGNOSIS — E781 Pure hyperglyceridemia: Secondary | ICD-10-CM | POA: Diagnosis not present

## 2018-05-04 DIAGNOSIS — R55 Syncope and collapse: Secondary | ICD-10-CM | POA: Diagnosis not present

## 2018-05-04 DIAGNOSIS — D61818 Other pancytopenia: Secondary | ICD-10-CM | POA: Diagnosis not present

## 2018-05-04 DIAGNOSIS — K219 Gastro-esophageal reflux disease without esophagitis: Secondary | ICD-10-CM | POA: Diagnosis present

## 2018-05-04 DIAGNOSIS — R0689 Other abnormalities of breathing: Secondary | ICD-10-CM | POA: Diagnosis not present

## 2018-05-04 DIAGNOSIS — K7581 Nonalcoholic steatohepatitis (NASH): Secondary | ICD-10-CM | POA: Diagnosis not present

## 2018-05-04 DIAGNOSIS — Z66 Do not resuscitate: Secondary | ICD-10-CM | POA: Diagnosis not present

## 2018-05-04 DIAGNOSIS — T5191XA Toxic effect of unspecified alcohol, accidental (unintentional), initial encounter: Secondary | ICD-10-CM | POA: Diagnosis present

## 2018-05-04 DIAGNOSIS — R0602 Shortness of breath: Secondary | ICD-10-CM | POA: Diagnosis not present

## 2018-05-04 DIAGNOSIS — E43 Unspecified severe protein-calorie malnutrition: Secondary | ICD-10-CM | POA: Diagnosis present

## 2018-05-04 DIAGNOSIS — Z79899 Other long term (current) drug therapy: Secondary | ICD-10-CM

## 2018-05-04 DIAGNOSIS — W19XXXA Unspecified fall, initial encounter: Secondary | ICD-10-CM | POA: Diagnosis not present

## 2018-05-04 DIAGNOSIS — E8770 Fluid overload, unspecified: Secondary | ICD-10-CM | POA: Diagnosis not present

## 2018-05-04 DIAGNOSIS — I119 Hypertensive heart disease without heart failure: Secondary | ICD-10-CM | POA: Diagnosis present

## 2018-05-04 DIAGNOSIS — Z20828 Contact with and (suspected) exposure to other viral communicable diseases: Secondary | ICD-10-CM | POA: Diagnosis present

## 2018-05-04 DIAGNOSIS — Z809 Family history of malignant neoplasm, unspecified: Secondary | ICD-10-CM

## 2018-05-04 DIAGNOSIS — W1830XA Fall on same level, unspecified, initial encounter: Secondary | ICD-10-CM | POA: Diagnosis present

## 2018-05-04 DIAGNOSIS — S199XXA Unspecified injury of neck, initial encounter: Secondary | ICD-10-CM | POA: Diagnosis not present

## 2018-05-04 DIAGNOSIS — R1310 Dysphagia, unspecified: Secondary | ICD-10-CM

## 2018-05-04 DIAGNOSIS — Z888 Allergy status to other drugs, medicaments and biological substances status: Secondary | ICD-10-CM

## 2018-05-04 DIAGNOSIS — S065X9A Traumatic subdural hemorrhage with loss of consciousness of unspecified duration, initial encounter: Principal | ICD-10-CM | POA: Diagnosis present

## 2018-05-04 DIAGNOSIS — E782 Mixed hyperlipidemia: Secondary | ICD-10-CM | POA: Diagnosis not present

## 2018-05-04 DIAGNOSIS — C61 Malignant neoplasm of prostate: Secondary | ICD-10-CM | POA: Diagnosis present

## 2018-05-04 DIAGNOSIS — D696 Thrombocytopenia, unspecified: Secondary | ICD-10-CM | POA: Diagnosis not present

## 2018-05-04 DIAGNOSIS — S0993XA Unspecified injury of face, initial encounter: Secondary | ICD-10-CM | POA: Diagnosis not present

## 2018-05-04 DIAGNOSIS — Z95828 Presence of other vascular implants and grafts: Secondary | ICD-10-CM

## 2018-05-04 DIAGNOSIS — S0083XA Contusion of other part of head, initial encounter: Secondary | ICD-10-CM | POA: Diagnosis not present

## 2018-05-04 DIAGNOSIS — Z7984 Long term (current) use of oral hypoglycemic drugs: Secondary | ICD-10-CM

## 2018-05-04 DIAGNOSIS — S12191A Other nondisplaced fracture of second cervical vertebra, initial encounter for closed fracture: Secondary | ICD-10-CM | POA: Diagnosis not present

## 2018-05-04 DIAGNOSIS — K7030 Alcoholic cirrhosis of liver without ascites: Secondary | ICD-10-CM | POA: Diagnosis present

## 2018-05-04 DIAGNOSIS — Z9049 Acquired absence of other specified parts of digestive tract: Secondary | ICD-10-CM

## 2018-05-04 DIAGNOSIS — J449 Chronic obstructive pulmonary disease, unspecified: Secondary | ICD-10-CM | POA: Diagnosis not present

## 2018-05-04 LAB — CBC WITH DIFFERENTIAL/PLATELET
Abs Immature Granulocytes: 0.01 10*3/uL (ref 0.00–0.07)
Basophils Absolute: 0 10*3/uL (ref 0.0–0.1)
Basophils Relative: 1 %
Eosinophils Absolute: 0 10*3/uL (ref 0.0–0.5)
Eosinophils Relative: 2 %
HCT: 37.6 % — ABNORMAL LOW (ref 39.0–52.0)
Hemoglobin: 13.1 g/dL (ref 13.0–17.0)
Immature Granulocytes: 1 %
Lymphocytes Relative: 34 %
Lymphs Abs: 0.6 10*3/uL — ABNORMAL LOW (ref 0.7–4.0)
MCH: 33.6 pg (ref 26.0–34.0)
MCHC: 34.8 g/dL (ref 30.0–36.0)
MCV: 96.4 fL (ref 80.0–100.0)
Monocytes Absolute: 0.2 10*3/uL (ref 0.1–1.0)
Monocytes Relative: 11 %
Neutro Abs: 0.9 10*3/uL — ABNORMAL LOW (ref 1.7–7.7)
Neutrophils Relative %: 51 %
Platelets: 35 10*3/uL — ABNORMAL LOW (ref 150–400)
RBC: 3.9 MIL/uL — ABNORMAL LOW (ref 4.22–5.81)
RDW: 13.5 % (ref 11.5–15.5)
Smear Review: DECREASED
WBC: 1.7 10*3/uL — ABNORMAL LOW (ref 4.0–10.5)
nRBC: 0 % (ref 0.0–0.2)

## 2018-05-04 LAB — PROTIME-INR
INR: 1.2 (ref 0.8–1.2)
Prothrombin Time: 14.8 seconds (ref 11.4–15.2)

## 2018-05-04 LAB — PATHOLOGIST SMEAR REVIEW

## 2018-05-04 LAB — BASIC METABOLIC PANEL
Anion gap: 11 (ref 5–15)
BUN: 6 mg/dL — ABNORMAL LOW (ref 8–23)
CO2: 24 mmol/L (ref 22–32)
Calcium: 8.3 mg/dL — ABNORMAL LOW (ref 8.9–10.3)
Chloride: 101 mmol/L (ref 98–111)
Creatinine, Ser: 0.94 mg/dL (ref 0.61–1.24)
GFR calc Af Amer: >60 mL/min (ref >60)
GFR calc non Af Amer: >60 mL/min (ref >60)
Glucose, Bld: 112 mg/dL — ABNORMAL HIGH (ref 70–99)
Potassium: 3.7 mmol/L (ref 3.5–5.1)
Sodium: 136 mmol/L (ref 135–145)

## 2018-05-04 LAB — ETHANOL: Alcohol, Ethyl (B): 212 mg/dL — ABNORMAL HIGH (ref <10)

## 2018-05-04 LAB — TYPE AND SCREEN
ABO/RH(D): A POS
Antibody Screen: NEGATIVE

## 2018-05-04 LAB — MRSA PCR SCREENING: MRSA by PCR: NEGATIVE

## 2018-05-04 MED ORDER — BISACODYL 5 MG PO TBEC: 5.0000 mg | DELAYED_RELEASE_TABLET | Freq: Every day | ORAL | Status: DC | PRN

## 2018-05-04 MED ORDER — IOHEXOL 350 MG/ML SOLN
75.0000 mL | Freq: Once | INTRAVENOUS | Status: AC | PRN
  Administered 2018-05-04: 06:00:00 75 mL via INTRAVENOUS

## 2018-05-04 MED ORDER — LISINOPRIL 20 MG PO TABS
20.0000 mg | ORAL_TABLET | Freq: Every evening | ORAL | Status: DC
  Administered 2018-05-05 – 2018-05-07 (×2): 20 mg via ORAL
  Filled 2018-05-04 (×2): qty 1

## 2018-05-04 MED ORDER — SODIUM CHLORIDE 0.9% FLUSH
3.0000 mL | Freq: Two times a day (BID) | INTRAVENOUS | Status: DC
  Administered 2018-05-04 – 2018-05-08 (×6): 3 mL via INTRAVENOUS

## 2018-05-04 MED ORDER — OXYCODONE HCL 5 MG PO TABS: 5.0000 mg | ORAL_TABLET | ORAL | Status: DC | PRN

## 2018-05-04 MED ORDER — SODIUM CHLORIDE 0.9% FLUSH: 3.0000 mL | INTRAVENOUS | Status: DC | PRN

## 2018-05-04 MED ORDER — ONDANSETRON HCL 4 MG/2ML IJ SOLN
INTRAMUSCULAR | Status: AC
  Administered 2018-05-04: 14:00:00 4 mg via INTRAVENOUS
  Filled 2018-05-04: qty 2

## 2018-05-04 MED ORDER — LORATADINE 10 MG PO TABS
10.0000 mg | ORAL_TABLET | Freq: Every day | ORAL | Status: DC | PRN
  Administered 2018-05-18: 10:00:00 10 mg via ORAL
  Filled 2018-05-04: qty 1

## 2018-05-04 MED ORDER — CHLORHEXIDINE GLUCONATE 0.12 % MT SOLN
15.0000 mL | Freq: Two times a day (BID) | OROMUCOSAL | Status: DC
  Administered 2018-05-04 – 2018-05-24 (×39): 15 mL via OROMUCOSAL
  Filled 2018-05-04 (×22): qty 15

## 2018-05-04 MED ORDER — PREGABALIN 50 MG PO CAPS
150.0000 mg | ORAL_CAPSULE | Freq: Two times a day (BID) | ORAL | Status: DC
  Administered 2018-05-05 – 2018-05-07 (×2): 150 mg via ORAL
  Filled 2018-05-04 (×3): qty 1

## 2018-05-04 MED ORDER — METFORMIN HCL ER 500 MG PO TB24
1000.0000 mg | ORAL_TABLET | Freq: Every day | ORAL | Status: DC
  Filled 2018-05-04: qty 2

## 2018-05-04 MED ORDER — SODIUM CHLORIDE 0.9% IV SOLUTION: Freq: Once | INTRAVENOUS | Status: DC

## 2018-05-04 MED ORDER — ACETAMINOPHEN 650 MG RE SUPP
650.0000 mg | Freq: Four times a day (QID) | RECTAL | Status: DC | PRN
  Administered 2018-05-04: 21:00:00 650 mg via RECTAL
  Filled 2018-05-04 (×2): qty 1

## 2018-05-04 MED ORDER — LEVETIRACETAM IN NACL 500 MG/100ML IV SOLN
500.0000 mg | Freq: Two times a day (BID) | INTRAVENOUS | Status: DC
  Administered 2018-05-04 – 2018-05-11 (×15): 500 mg via INTRAVENOUS
  Filled 2018-05-04 (×15): qty 100

## 2018-05-04 MED ORDER — CALCIUM CARBONATE ANTACID 500 MG PO CHEW: 1.0000 | CHEWABLE_TABLET | ORAL | Status: DC | PRN

## 2018-05-04 MED ORDER — ONDANSETRON HCL 4 MG/2ML IJ SOLN
4.0000 mg | Freq: Four times a day (QID) | INTRAMUSCULAR | Status: DC | PRN
  Administered 2018-05-04 – 2018-05-15 (×6): 4 mg via INTRAVENOUS
  Filled 2018-05-04 (×5): qty 2

## 2018-05-04 MED ORDER — LEVETIRACETAM 500 MG PO TABS: 500.0000 mg | ORAL_TABLET | Freq: Two times a day (BID) | ORAL | Status: DC

## 2018-05-04 MED ORDER — SODIUM CHLORIDE 0.9 % IV SOLN
INTRAVENOUS | Status: DC
  Administered 2018-05-04 – 2018-05-06 (×4): via INTRAVENOUS

## 2018-05-04 MED ORDER — HYDRALAZINE HCL 20 MG/ML IJ SOLN
5.0000 mg | INTRAMUSCULAR | Status: DC | PRN
  Administered 2018-05-04: 09:00:00 20 mg via INTRAVENOUS
  Filled 2018-05-04: qty 1

## 2018-05-04 MED ORDER — ACETAMINOPHEN 325 MG PO TABS
650.0000 mg | ORAL_TABLET | Freq: Four times a day (QID) | ORAL | Status: DC | PRN
  Administered 2018-05-05 – 2018-05-18 (×7): 650 mg via ORAL
  Filled 2018-05-04 (×6): qty 2

## 2018-05-04 MED ORDER — ALPRAZOLAM 0.5 MG PO TABS: 0.5000 mg | ORAL_TABLET | Freq: Three times a day (TID) | ORAL | Status: DC | PRN

## 2018-05-04 MED ORDER — PROMETHAZINE HCL 25 MG/ML IJ SOLN
12.5000 mg | Freq: Four times a day (QID) | INTRAMUSCULAR | Status: DC | PRN
  Administered 2018-05-04 – 2018-05-17 (×4): 25 mg via INTRAVENOUS
  Administered 2018-05-17: 12.5 mg via INTRAVENOUS
  Filled 2018-05-04 (×6): qty 1

## 2018-05-04 MED ORDER — SODIUM CHLORIDE 0.9 % IV SOLN: 250.0000 mL | INTRAVENOUS | Status: DC | PRN

## 2018-05-04 MED ORDER — FLEET ENEMA 7-19 GM/118ML RE ENEM: 1.0000 | ENEMA | Freq: Once | RECTAL | Status: DC | PRN

## 2018-05-04 MED ORDER — HYDRALAZINE HCL 20 MG/ML IJ SOLN
5.0000 mg | INTRAMUSCULAR | Status: DC | PRN
  Administered 2018-05-06: 20 mg via INTRAVENOUS
  Filled 2018-05-04 (×2): qty 1

## 2018-05-04 MED ORDER — ONDANSETRON HCL 4 MG PO TABS: 4.0000 mg | ORAL_TABLET | Freq: Four times a day (QID) | ORAL | Status: DC | PRN

## 2018-05-04 MED ORDER — PRAVASTATIN SODIUM 10 MG PO TABS
20.0000 mg | ORAL_TABLET | Freq: Every day | ORAL | Status: DC
  Administered 2018-05-05: 22:00:00 20 mg via ORAL
  Filled 2018-05-04 (×2): qty 2

## 2018-05-04 MED ORDER — ORAL CARE MOUTH RINSE
15.0000 mL | Freq: Two times a day (BID) | OROMUCOSAL | Status: DC
  Administered 2018-05-04 – 2018-05-24 (×40): 15 mL via OROMUCOSAL

## 2018-05-04 MED ORDER — ZOLPIDEM TARTRATE 5 MG PO TABS: 5.0000 mg | ORAL_TABLET | Freq: Every evening | ORAL | Status: DC | PRN

## 2018-05-04 NOTE — ED Provider Notes (Signed)
[facility] EMERGENCY DEPARTMENT Provider Note   CSN: [id number] Arrival date & time: 05/04/18  0240    History   Chief Complaint Chief Complaint  Patient presents with   Alcohol Intoxication   Fall   Level 5 caveat due to altered mental status HPI [name] is a 73 y.o. male.     The history is provided by the patient.  Alcohol Intoxication  This is a new problem. Episode onset: unknown. The problem occurs constantly. The problem has been gradually improving. Nothing aggravates the symptoms. Nothing relieves the symptoms.  Fall    He presents after fall.  He has a history of alcohol abuse, hypertension, fell down after drinking alcohol.  EMS reports that patient had a GCS of 9 initially, but that is improving Past Medical History:  Diagnosis Date   Anxiety    Benign localized prostatic hyperplasia with lower urinary tract symptoms (LUTS)    Depression    Diverticulosis of colon    GERD (gastroesophageal reflux disease)    History of colitis    History of colonic diverticulitis    03-10-2006 s/p bowel resection    History of deep vein thrombosis (DVT) of lower extremity 06/2006   bilateral femoral/   04-08-2017 per pt had several dvt's in 2008   Hypertension    Hypertriglyceridemia    NAFLD (nonalcoholic fatty liver disease)    OA (osteoarthritis)    knees   Peripheral neuropathy    Prostate cancer [facility]) urologist-- dr Alyson Ingles  oncologist- dr Tammi Klippel   dx 12-02-2016-- Stage T1c, Gleason 3+4,  PSA 8.2, 37.8cc-- scheduled for radioactive seed implants 04-16-2017   S/P insertion of IVC (inferior vena caval) filter 07/02/2006   due to bilateral fermoral dvt   Seasonal allergies    Thrombocytopenia ([place])    Type 2 diabetes mellitus ([place])    followed by pcp   Weak urinary stream    Wears glasses     Patient Active Problem List   Diagnosis Date Noted   Prostate cancer ([place]) 04/16/2017   Malignant neoplasm of  prostate ([place]) 01/07/2017   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   Diverticulosis 07/19/2013   DIARRHEA 08/22/2008   COLITIS 08/21/2008   DIVERTICULITIS, COLON 08/21/2008   DM 08/20/2008   HYPERLIPIDEMIA 08/20/2008   SMOKER 08/20/2008   HYPERTENSION 08/20/2008   EXTERNAL HEMORRHOIDS 08/20/2008   COLONIC POLYPS, ADENOMATOUS, HX OF 08/20/2008    Past Surgical History:  Procedure Laterality Date   CATARACT EXTRACTION W/ INTRAOCULAR LENS  IMPLANT, BILATERAL  2010   CHOLECYSTECTOMY OPEN  1973   and APPENDECTOMY   COLONOSCOPY N/A 06/01/2012   Procedure: COLONOSCOPY;  Surgeon: Rogene Houston, MD;  Location: [facility];  Service: Endoscopy;  Laterality: N/A;  930-moved to [address] to notify pt   IVC FILTER INSERTION  07/02/2006   PROSTATE BIOPSY  12-02-2016   dr Alyson Ingles office   RADIOACTIVE SEED IMPLANT N/A 04/16/2017   Procedure: RADIOACTIVE SEED IMPLANT/BRACHYTHERAPY IMPLANT;  Surgeon: Cleon Gustin, MD;  Location: [facility];  Service: Urology;  Laterality: N/A;   SIGMOID RESECTION W/ STAPLED SIDE TO SIDE ANASTOMOSIS/ DRAINAGE ABSCESS  03-10-2006  dr Romona Curls [facility]   SPACE OAR INSTILLATION N/A 04/16/2017   Procedure: SPACE OAR INSTILLATION;  Surgeon: Cleon Gustin, MD;  Location: [facility];  Service: Urology;  Laterality: N/A;        Home Medications    Prior to Admission medications  Medication Sig Start Date End Date Taking? Authorizing Provider  acetaminophen (TYLENOL) 500 MG tablet Take 1,000 mg by mouth every 6 (six) hours as needed for pain (headache).    [provider]  ALPRAZolam Duanne Moron) 0.5 MG tablet Take 0.5 mg by mouth 3 (three) times daily as needed for anxiety.    [provider]  calcium carbonate (TUMS - DOSED IN MG ELEMENTAL CALCIUM) 500 MG chewable tablet Chew 1 tablet by mouth as needed for indigestion or heartburn.    [provider]  cetirizine  (ZYRTEC) 10 MG tablet Take 10 mg by mouth daily as needed for allergies.    [provider]  desvenlafaxine (PRISTIQ) 50 MG 24 hr tablet Take 50 mg by mouth at bedtime.    [provider]  fenofibrate 160 MG tablet Take 160 mg by mouth at bedtime.    [provider]  GEMFIBROZIL PO Take 1 tablet by mouth daily.    [provider]  lisinopril (PRINIVIL,ZESTRIL) 20 MG tablet Take 20 mg by mouth every evening.     [provider]  loratadine (CLARITIN) 10 MG tablet Take 10 mg by mouth daily as needed for allergies.    [provider]  LYRICA 150 MG capsule Take 150 mg by mouth 2 (two) times daily.  12/05/16   [provider]  metformin (FORTAMET) 1000 MG (OSM) 24 hr tablet Take 1,000 mg by mouth at bedtime.    [provider]  naproxen sodium (ALEVE) 220 MG tablet Take 220 mg by mouth 2 (two) times daily as needed (headache/pain).    [provider]  pravastatin (PRAVACHOL) 20 MG tablet Take 20 mg by mouth at bedtime. 07/07/14   [provider]  zolpidem (AMBIEN) 10 MG tablet Take 10 mg by mouth at bedtime as needed for sleep.     [provider]    Family History Family History  Problem Relation Age of Onset   Cancer Maternal Uncle        colon    Social History Social History   Tobacco Use   Smoking status: Current Every Day Smoker    Packs/day: 0.50    Years: 54.00    Pack years: 27.00    Types: Cigarettes   Smokeless tobacco: Never Used  Substance Use Topics   Alcohol use: Yes    Comment: occasional   Drug use: No     Allergies   Metronidazole   Review of Systems Review of Systems  Unable to perform ROS: Mental status change     Physical Exam Updated Vital Signs BP (!) 142/81    Pulse 70    Temp 97.6 F (36.4 C) (Oral)    Resp 16    SpO2 98%   Physical Exam CONSTITUTIONAL: Disheveled, appears older than stated age HEAD: Normocephalic/atraumatic EYES:  EOMI/PERRL ENMT: Mucous membranes moist  blood noted left nare, no septal hematoma, no other evidence of facial injury, patient is edentulous NECK: Cervical collar in place SPINE/BACK:entire spine nontender, no bruising/crepitance/stepoffs noted to spine CV: S1/S2 noted, no murmurs/rubs/gallops noted LUNGS: Lungs are clear to auscultation bilaterally, no apparent distress ABDOMEN: soft, nontender, no rebound or guarding, bowel sounds noted throughout abdomen GU:no cva tenderness NEURO: Pt is awake/alert GCS 14, moves all extremitiesx4.  No facial droop.   EXTREMITIES: pulses normal/equal, full ROM, pelvis stable, all other extremities/joints palpated/ranged and nontender SKIN: warm, color normal   ED Treatments / Results  Labs (all labs ordered are listed, but only  abnormal results are displayed) Labs Reviewed  BASIC METABOLIC PANEL - Abnormal; Notable for the following components:      Result Value   Glucose, Bld 112 (*)    BUN 6 (*)    Calcium 8.3 (*)    All other components within normal limits  CBC WITH DIFFERENTIAL/PLATELET - Abnormal; Notable for the following components:   WBC 1.7 (*)    RBC 3.90 (*)    HCT 37.6 (*)    Platelets 35 (*)    Neutro Abs 0.9 (*)    Lymphs Abs 0.6 (*)    All other components within normal limits  ETHANOL - Abnormal; Notable for the following components:   Alcohol, Ethyl (B) 212 (*)    All other components within normal limits  PROTIME-INR  TYPE AND SCREEN  PREPARE PLATELET PHERESIS  ABO/RH    EKG EKG Interpretation  Date/Time:  Wednesday May 04 2018 02:47:31 EDT Ventricular Rate:  69 PR Interval:    QRS Duration: 109 QT Interval:  471 QTC Calculation: 505 R Axis:   -4 Text Interpretation:  Sinus rhythm Inferior infarct, old   Artifact in lead(s) I II III aVR aVL aVF V1 Interpretation limited secondary to artifact Confirmed by Ripley Fraise [phone]) on 05/04/2018 3:17:44 AM   Radiology Ct Head Wo Contrast  Result Date:  05/04/2018 CLINICAL DATA:  Initial evaluation for acute trauma, fall. EXAM: CT HEAD WITHOUT CONTRAST CT MAXILLOFACIAL WITHOUT CONTRAST CT CERVICAL SPINE WITHOUT CONTRAST TECHNIQUE: Multidetector CT imaging of the head, cervical spine, and maxillofacial structures were performed using the standard protocol without intravenous contrast. Multiplanar CT image reconstructions of the cervical spine and maxillofacial structures were also generated. COMPARISON:  Prior CT from 11/09/2017. FINDINGS: CT HEAD FINDINGS Brain: Generalized age-related cerebral atrophy with chronic small vessel ischemic disease. Mixed density acute on chronic right holo hemispheric subdural hematoma overlies the right cerebral convexity. This measures up to 13 mm in maximal thickness. Associated mass effect on the subjacent right cerebral hemisphere with up to 8 mm of right-to-left shift. Small volume subdural blood extends along the anterior and posterior falx and around the right temporal pole. Associated scattered posttraumatic subarachnoid hemorrhage with possible contusion within the underlying right temporal lobe. Associated small volume subarachnoid within the prepontine cistern. Trace hemorrhage seen along the septum pellucidum. Few small cortical contusions measuring up to 8 mm present at the high left frontal lobe near the vertex (series 3, image 33). Partial effacement of the right lateral ventricle without hydrocephalus or ventricular trapping. Basilar cisterns remain patent. Underlying atrophy with chronic microvascular ischemic disease. No acute large vessel territory infarct. No mass lesion. Vascular: No hyperdense vessel. Calcified atherosclerosis present at the skull base. Skull: Left parietal scalp contusion.  No calvarial fracture. Other: Right mastoid effusion, likely chronic. No temporal bone fracture. CT MAXILLOFACIAL FINDINGS Osseous: Chronic left-sided tripod fractures involving the left side of attic arch, lateral left  orbit, left orbital floor, and left maxilla noted. No definite acute component. Scattered foci of gas within the left pterygoid palatine fossa favored to be related to communication with the sinuses rather than acute fracture. Pterygoid plates intact. Maxilla otherwise intact. No acute nasal bone fracture. Right-to-left nasal septal deviation without fracture. No acute mandibular fracture. Mandibular condyles normally situated. Degenerative changes noted about the right TMJ. Patient is edentulous. Orbits: Globes and orbital soft tissues within normal limits. No retro-orbital hematoma. Patient status post bilateral ocular lens replacement. Sinuses: Left maxillary sinus opacified. Central hyperdensity favored to reflect desiccated secretions  and/or superimposed fungal elements. Chronic left sphenoid sinusitis noted as well. Additional mild scattered mucosal thickening throughout the remainder of the paranasal sinuses. No definite hemosinus. Soft tissues: No appreciable soft tissue injury about the face. CT CERVICAL SPINE FINDINGS Alignment: Physiologic. Skull base and vertebrae: Skull base intact. Normal C1-2 articulations are preserved. Fracture lucencies extend through the left lateral mass of C2, somewhat age indeterminate, but could be acute (series 21, image 15). Involvement of the left transverse foramen. Vertebral body heights maintained. No other acute fracture. Soft tissues and spinal canal: Soft tissues of the neck demonstrate no acute finding. No abnormal prevertebral edema. Spinal canal demonstrates no acute finding. Prominent vascular calcifications about the carotid bifurcations. Disc levels: Prominent anterior bulky osteophytic spurring noted at C4-5 through C7-T1. Multilevel facet arthrosis, most notable at C3-4 and C4-5 on the left. Central disc protrusions at C3-4 through C5-6 with up to moderate spinal stenosis. Upper chest: Visualized upper chest demonstrates no acute finding. Mild paraseptal  emphysema at the lung apices. Other: None. IMPRESSION: CT HEAD: 1. Large mixed density right holo hemispheric subdural hematoma measuring up to 13 mm in maximal diameter. Secondary mass effect on the right cerebral hemisphere with up to 8 mm right-to-left midline shift. No hydrocephalus or ventricular trapping. 2. Associated posttraumatic subarachnoid hemorrhage and/or contusion within the underlying right temporal lobe, with small volume subarachnoid within the basilar cisterns. 3. Few small cortical contusions at the high left frontal lobe near the vertex. 4. Left parietal scalp contusion.  No calvarial fracture. CT MAXILLOFACIAL: 1. No acute maxillofacial injury identified. 2. Chronic left facial tripod fracture as above. 3. Chronic left maxillary and sphenoid sinusitis. CT CERVICAL SPINE: 1. Fracture lucencies extending through the left lateral mass of C2, age indeterminate, and could be chronic in nature, although an acute component not entirely excluded. Correlation with physical exam recommended. Additionally, further assessment with dedicated MRI to evaluate for associated marrow edema could be performed for further evaluation. Please note that this fracture does involve the left C2 transverse foramen. Further assessment with dedicated CTA suggested to evaluate for possible vascular injury. 2. No other acute traumatic injury within the cervical spine. 3. Central disc protrusions at C3-4 through C5-6 with resultant mild to moderate spinal stenosis. Critical Value/emergent results were called by telephone at the time of interpretation on 05/04/2018 at 4:51 am to Dr. Ripley Fraise , who verbally acknowledged these results. Electronically Signed   By: Jeannine Boga M.D.   On: 05/04/2018 04:54   Ct Cervical Spine Wo Contrast  Result Date: 05/04/2018 CLINICAL DATA:  Initial evaluation for acute trauma, fall. EXAM: CT HEAD WITHOUT CONTRAST CT MAXILLOFACIAL WITHOUT CONTRAST CT CERVICAL SPINE WITHOUT  CONTRAST TECHNIQUE: Multidetector CT imaging of the head, cervical spine, and maxillofacial structures were performed using the standard protocol without intravenous contrast. Multiplanar CT image reconstructions of the cervical spine and maxillofacial structures were also generated. COMPARISON:  Prior CT from 11/09/2017. FINDINGS: CT HEAD FINDINGS Brain: Generalized age-related cerebral atrophy with chronic small vessel ischemic disease. Mixed density acute on chronic right holo hemispheric subdural hematoma overlies the right cerebral convexity. This measures up to 13 mm in maximal thickness. Associated mass effect on the subjacent right cerebral hemisphere with up to 8 mm of right-to-left shift. Small volume subdural blood extends along the anterior and posterior falx and around the right temporal pole. Associated scattered posttraumatic subarachnoid hemorrhage with possible contusion within the underlying right temporal lobe. Associated small volume subarachnoid within the prepontine cistern. Trace hemorrhage seen  along the septum pellucidum. Few small cortical contusions measuring up to 8 mm present at the high left frontal lobe near the vertex (series 3, image 33). Partial effacement of the right lateral ventricle without hydrocephalus or ventricular trapping. Basilar cisterns remain patent. Underlying atrophy with chronic microvascular ischemic disease. No acute large vessel territory infarct. No mass lesion. Vascular: No hyperdense vessel. Calcified atherosclerosis present at the skull base. Skull: Left parietal scalp contusion.  No calvarial fracture. Other: Right mastoid effusion, likely chronic. No temporal bone fracture. CT MAXILLOFACIAL FINDINGS Osseous: Chronic left-sided tripod fractures involving the left side of attic arch, lateral left orbit, left orbital floor, and left maxilla noted. No definite acute component. Scattered foci of gas within the left pterygoid palatine fossa favored to be related  to communication with the sinuses rather than acute fracture. Pterygoid plates intact. Maxilla otherwise intact. No acute nasal bone fracture. Right-to-left nasal septal deviation without fracture. No acute mandibular fracture. Mandibular condyles normally situated. Degenerative changes noted about the right TMJ. Patient is edentulous. Orbits: Globes and orbital soft tissues within normal limits. No retro-orbital hematoma. Patient status post bilateral ocular lens replacement. Sinuses: Left maxillary sinus opacified. Central hyperdensity favored to reflect desiccated secretions and/or superimposed fungal elements. Chronic left sphenoid sinusitis noted as well. Additional mild scattered mucosal thickening throughout the remainder of the paranasal sinuses. No definite hemosinus. Soft tissues: No appreciable soft tissue injury about the face. CT CERVICAL SPINE FINDINGS Alignment: Physiologic. Skull base and vertebrae: Skull base intact. Normal C1-2 articulations are preserved. Fracture lucencies extend through the left lateral mass of C2, somewhat age indeterminate, but could be acute (series 21, image 15). Involvement of the left transverse foramen. Vertebral body heights maintained. No other acute fracture. Soft tissues and spinal canal: Soft tissues of the neck demonstrate no acute finding. No abnormal prevertebral edema. Spinal canal demonstrates no acute finding. Prominent vascular calcifications about the carotid bifurcations. Disc levels: Prominent anterior bulky osteophytic spurring noted at C4-5 through C7-T1. Multilevel facet arthrosis, most notable at C3-4 and C4-5 on the left. Central disc protrusions at C3-4 through C5-6 with up to moderate spinal stenosis. Upper chest: Visualized upper chest demonstrates no acute finding. Mild paraseptal emphysema at the lung apices. Other: None. IMPRESSION: CT HEAD: 1. Large mixed density right holo hemispheric subdural hematoma measuring up to 13 mm in maximal diameter.  Secondary mass effect on the right cerebral hemisphere with up to 8 mm right-to-left midline shift. No hydrocephalus or ventricular trapping. 2. Associated posttraumatic subarachnoid hemorrhage and/or contusion within the underlying right temporal lobe, with small volume subarachnoid within the basilar cisterns. 3. Few small cortical contusions at the high left frontal lobe near the vertex. 4. Left parietal scalp contusion.  No calvarial fracture. CT MAXILLOFACIAL: 1. No acute maxillofacial injury identified. 2. Chronic left facial tripod fracture as above. 3. Chronic left maxillary and sphenoid sinusitis. CT CERVICAL SPINE: 1. Fracture lucencies extending through the left lateral mass of C2, age indeterminate, and could be chronic in nature, although an acute component not entirely excluded. Correlation with physical exam recommended. Additionally, further assessment with dedicated MRI to evaluate for associated marrow edema could be performed for further evaluation. Please note that this fracture does involve the left C2 transverse foramen. Further assessment with dedicated CTA suggested to evaluate for possible vascular injury. 2. No other acute traumatic injury within the cervical spine. 3. Central disc protrusions at C3-4 through C5-6 with resultant mild to moderate spinal stenosis. Critical Value/emergent results were called by telephone  at the time of interpretation on 05/04/2018 at 4:51 am to Dr. Ripley Fraise , who verbally acknowledged these results. Electronically Signed   By: Jeannine Boga M.D.   On: 05/04/2018 04:54   Ct Maxillofacial Wo Contrast  Result Date: 05/04/2018 CLINICAL DATA:  Initial evaluation for acute trauma, fall. EXAM: CT HEAD WITHOUT CONTRAST CT MAXILLOFACIAL WITHOUT CONTRAST CT CERVICAL SPINE WITHOUT CONTRAST TECHNIQUE: Multidetector CT imaging of the head, cervical spine, and maxillofacial structures were performed using the standard protocol without intravenous contrast.  Multiplanar CT image reconstructions of the cervical spine and maxillofacial structures were also generated. COMPARISON:  Prior CT from 11/09/2017. FINDINGS: CT HEAD FINDINGS Brain: Generalized age-related cerebral atrophy with chronic small vessel ischemic disease. Mixed density acute on chronic right holo hemispheric subdural hematoma overlies the right cerebral convexity. This measures up to 13 mm in maximal thickness. Associated mass effect on the subjacent right cerebral hemisphere with up to 8 mm of right-to-left shift. Small volume subdural blood extends along the anterior and posterior falx and around the right temporal pole. Associated scattered posttraumatic subarachnoid hemorrhage with possible contusion within the underlying right temporal lobe. Associated small volume subarachnoid within the prepontine cistern. Trace hemorrhage seen along the septum pellucidum. Few small cortical contusions measuring up to 8 mm present at the high left frontal lobe near the vertex (series 3, image 33). Partial effacement of the right lateral ventricle without hydrocephalus or ventricular trapping. Basilar cisterns remain patent. Underlying atrophy with chronic microvascular ischemic disease. No acute large vessel territory infarct. No mass lesion. Vascular: No hyperdense vessel. Calcified atherosclerosis present at the skull base. Skull: Left parietal scalp contusion.  No calvarial fracture. Other: Right mastoid effusion, likely chronic. No temporal bone fracture. CT MAXILLOFACIAL FINDINGS Osseous: Chronic left-sided tripod fractures involving the left side of attic arch, lateral left orbit, left orbital floor, and left maxilla noted. No definite acute component. Scattered foci of gas within the left pterygoid palatine fossa favored to be related to communication with the sinuses rather than acute fracture. Pterygoid plates intact. Maxilla otherwise intact. No acute nasal bone fracture. Right-to-left nasal septal  deviation without fracture. No acute mandibular fracture. Mandibular condyles normally situated. Degenerative changes noted about the right TMJ. Patient is edentulous. Orbits: Globes and orbital soft tissues within normal limits. No retro-orbital hematoma. Patient status post bilateral ocular lens replacement. Sinuses: Left maxillary sinus opacified. Central hyperdensity favored to reflect desiccated secretions and/or superimposed fungal elements. Chronic left sphenoid sinusitis noted as well. Additional mild scattered mucosal thickening throughout the remainder of the paranasal sinuses. No definite hemosinus. Soft tissues: No appreciable soft tissue injury about the face. CT CERVICAL SPINE FINDINGS Alignment: Physiologic. Skull base and vertebrae: Skull base intact. Normal C1-2 articulations are preserved. Fracture lucencies extend through the left lateral mass of C2, somewhat age indeterminate, but could be acute (series 21, image 15). Involvement of the left transverse foramen. Vertebral body heights maintained. No other acute fracture. Soft tissues and spinal canal: Soft tissues of the neck demonstrate no acute finding. No abnormal prevertebral edema. Spinal canal demonstrates no acute finding. Prominent vascular calcifications about the carotid bifurcations. Disc levels: Prominent anterior bulky osteophytic spurring noted at C4-5 through C7-T1. Multilevel facet arthrosis, most notable at C3-4 and C4-5 on the left. Central disc protrusions at C3-4 through C5-6 with up to moderate spinal stenosis. Upper chest: Visualized upper chest demonstrates no acute finding. Mild paraseptal emphysema at the lung apices. Other: None. IMPRESSION: CT HEAD: 1. Large mixed density right holo hemispheric  subdural hematoma measuring up to 13 mm in maximal diameter. Secondary mass effect on the right cerebral hemisphere with up to 8 mm right-to-left midline shift. No hydrocephalus or ventricular trapping. 2. Associated posttraumatic  subarachnoid hemorrhage and/or contusion within the underlying right temporal lobe, with small volume subarachnoid within the basilar cisterns. 3. Few small cortical contusions at the high left frontal lobe near the vertex. 4. Left parietal scalp contusion.  No calvarial fracture. CT MAXILLOFACIAL: 1. No acute maxillofacial injury identified. 2. Chronic left facial tripod fracture as above. 3. Chronic left maxillary and sphenoid sinusitis. CT CERVICAL SPINE: 1. Fracture lucencies extending through the left lateral mass of C2, age indeterminate, and could be chronic in nature, although an acute component not entirely excluded. Correlation with physical exam recommended. Additionally, further assessment with dedicated MRI to evaluate for associated marrow edema could be performed for further evaluation. Please note that this fracture does involve the left C2 transverse foramen. Further assessment with dedicated CTA suggested to evaluate for possible vascular injury. 2. No other acute traumatic injury within the cervical spine. 3. Central disc protrusions at C3-4 through C5-6 with resultant mild to moderate spinal stenosis. Critical Value/emergent results were called by telephone at the time of interpretation on 05/04/2018 at 4:51 am to Dr. Ripley Fraise , who verbally acknowledged these results. Electronically Signed   By: Jeannine Boga M.D.   On: 05/04/2018 04:54    Procedures Procedures  CRITICAL CARE Performed by: Sharyon Cable Total critical care time: 40 minutes Critical care time was exclusive of separately billable procedures and treating other patients. Critical care was necessary to treat or prevent imminent or life-threatening deterioration. Critical care was time spent personally by me on the following activities: development of treatment plan with patient and/or surrogate as well as nursing, discussions with consultants, evaluation of patient's response to treatment, examination of  patient, obtaining history from patient or surrogate, ordering and performing treatments and interventions, ordering and review of laboratory studies, ordering and review of radiographic studies, pulse oximetry and re-evaluation of patient's condition. PT Found to have subarachnoid and subdural hematoma.  Required consultation with neurosurgery, discussed the case with radiology and reviewed imaging Medications Ordered in ED Medications  0.9 %  sodium chloride infusion (Manually program via Guardrails IV Fluids) (has no administration in time range)     Initial Impression / Assessment and Plan / ED Course  I have reviewed the triage vital signs and the nursing notes.  Pertinent labs & imaging results that were available during my care of the patient were reviewed by me and considered in my medical decision making (see chart for details).        3:24 AM Patient with history of alcohol abuse presents with intoxication and fall.  He has evidence of facial injury.  I am unable to clear his C-spine.  CT imaging has been ordered. 4:26 AM CT reveals concerning findings for intracranial hemorrhage. Patient is still awake and alert but confused, GCS remains at 14 Discussed the case with on-call neurosurgery.  Patient also intoxicated, with chronic thrombocytopenia. 5:11 AM Discussed The case with radiology. Concern for cervical spine fracture in addition to the head injury.  Recommend MRI C-spine as well as CTA neck.  Aspen collar ordered This was endorsed to the on-call PA for neurosurgery.  No new facial fractures Neurosurgery also plans to get patient platelets due to significant thrombocytopenia.  Patient is critically ill and will be admitted   Pt remains awake/alert, [facility] Final  Clinical Impressions(s) / ED Diagnoses   Final diagnoses:  Alcoholic intoxication with complication ([place])  Thrombocytopenia ([place])  Subdural hematoma ([facility])  SAH (subarachnoid hemorrhage) ([place])  Closed  displaced fracture of second cervical vertebra, unspecified fracture morphology, initial encounter [facility])    ED Discharge Orders    None       Ripley Fraise, MD 05/04/18 [id number]

## 2018-05-04 NOTE — Progress Notes (Signed)
Alicia Amel, pt niece and next of kin, contacted RN for update. Updated on pt status and explained RN will call back with any changes. See updated contact info for [name].

## 2018-05-04 NOTE — ED Notes (Signed)
Aspen collar applied while maintaining c spine

## 2018-05-04 NOTE — ED Notes (Addendum)
Pt vomited large amount, very likely aspirated

## 2018-05-04 NOTE — ED Notes (Signed)
Called neurosurg to Dr Christy Gentles

## 2018-05-04 NOTE — H&P (Signed)
Chief Complaint   Chief Complaint  Patient presents with  . Alcohol Intoxication  . Fall    HPI   HPI: [name] is a 73 y.o. male With history of diabetes, hyperlipidemia, hypertension, prostate cancer, cirrhosis who was brought to ED after a fall. By report he was drinking with a friend when he fell forward. Patient is amnestic to events surrounding the fall. He does remember his arrival to [facility] but otherwise unable to provide any details including what he was doing yesterday. He underwent CT head/C spine which is significant for an acute right SDH and C2 fracture. Neurosurgical consultation requested by Dr Christy Gentles. Labs were reviewed revealing ETOH greater than 200 as well as a platelet count of 33.  Currently patient reports feeling "fine".  He does admit to drinking yesterday but states it was not a "significant" amount.  He is unable to quantify the amount of alcohol consumed.  He endorses chronic blurry vision out of his left eye but no changes since the fall.  He denies weakness in his bilateral upper or lower extremities.  He denies taking blood thinning medications.  Patient Active Problem List   Diagnosis Date Noted  . Prostate cancer ([place]) 04/16/2017  . Malignant neoplasm of prostate ([place]) 01/07/2017  . 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  . Diverticulosis 07/19/2013  . DIARRHEA 08/22/2008  . COLITIS 08/21/2008  . DIVERTICULITIS, COLON 08/21/2008  . DM 08/20/2008  . HYPERLIPIDEMIA 08/20/2008  . SMOKER 08/20/2008  . HYPERTENSION 08/20/2008  . EXTERNAL HEMORRHOIDS 08/20/2008  . COLONIC POLYPS, ADENOMATOUS, HX OF 08/20/2008    PMH: Past Medical History:  Diagnosis Date  . Anxiety   . Benign localized prostatic hyperplasia with lower urinary tract symptoms (LUTS)   . Depression   . Diverticulosis of colon   . GERD (gastroesophageal reflux disease)   . History of colitis   . History of colonic diverticulitis    03-10-2006 s/p bowel  resection   . 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 Hypertension   . Hypertriglyceridemia   . NAFLD (nonalcoholic fatty liver disease)   . OA (osteoarthritis)    knees  . Peripheral neuropathy   . Prostate cancer [facility]) urologist-- dr Alyson Ingles  oncologist- dr Tammi Klippel   dx 12-02-2016-- Stage T1c, Gleason 3+4,  PSA 8.2, 37.8cc-- scheduled for radioactive seed implants 04-16-2017  . S/P insertion of IVC (inferior vena caval) filter 07/02/2006   due to bilateral fermoral dvt  . Seasonal allergies   . Thrombocytopenia ([place])   . Type 2 diabetes mellitus ([place])    followed by pcp  . Weak urinary stream   . Wears glasses     PSH: Past Surgical History:  Procedure Laterality Date  . CATARACT EXTRACTION W/ INTRAOCULAR LENS  IMPLANT, BILATERAL  2010  . CHOLECYSTECTOMY OPEN  1973   and APPENDECTOMY  . COLONOSCOPY N/A 06/01/2012   Procedure: COLONOSCOPY;  Surgeon: Rogene Houston, MD;  [facility];  Service: Endoscopy;  Laterality: N/A;  930-moved to 1025 Ann to notify pt  . IVC FILTER INSERTION  07/02/2006  . PROSTATE BIOPSY  12-02-2016   dr Alyson Ingles office  . RADIOACTIVE SEED IMPLANT N/A 04/16/2017   Procedure: RADIOACTIVE SEED IMPLANT/BRACHYTHERAPY IMPLANT;  Surgeon: Cleon Gustin, MD;  [facility]: [facility];  Service: Urology;  Laterality: N/A;  . SIGMOID RESECTION W/ STAPLED  SIDE TO SIDE ANASTOMOSIS/ DRAINAGE ABSCESS  03-10-2006  dr bradford [facility]  . SPACE OAR INSTILLATION N/A 04/16/2017   Procedure: SPACE OAR INSTILLATION;  Surgeon: Cleon Gustin, MD;  Location: [facility];  Service: Urology;  Laterality: N/A;    (Not in a hospital admission)   SH: Social History   Tobacco Use  . Smoking status: Current Every Day Smoker    Packs/day: 0.50    Years: 54.00    Pack years: 27.00    Types: Cigarettes  . Smokeless tobacco: Never Used   Substance Use Topics  . Alcohol use: Yes    Comment: occasional  . Drug use: No    MEDS: Prior to Admission medications   Medication Sig Start Date End Date Taking? Authorizing Provider  acetaminophen (TYLENOL) 500 MG tablet Take 1,000 mg by mouth every 6 (six) hours as needed for pain (headache).    [provider]  ALPRAZolam Duanne Moron) 0.5 MG tablet Take 0.5 mg by mouth 3 (three) times daily as needed for anxiety.    [provider]  calcium carbonate (TUMS - DOSED IN MG ELEMENTAL CALCIUM) 500 MG chewable tablet Chew 1 tablet by mouth as needed for indigestion or heartburn.    [provider]  cetirizine (ZYRTEC) 10 MG tablet Take 10 mg by mouth daily as needed for allergies.    [provider]  desvenlafaxine (PRISTIQ) 50 MG 24 hr tablet Take 50 mg by mouth at bedtime.    [provider]  fenofibrate 160 MG tablet Take 160 mg by mouth at bedtime.    [provider]  GEMFIBROZIL PO Take 1 tablet by mouth daily.    [provider]  lisinopril (PRINIVIL,ZESTRIL) 20 MG tablet Take 20 mg by mouth every evening.     [provider]  loratadine (CLARITIN) 10 MG tablet Take 10 mg by mouth daily as needed for allergies.    [provider]  LYRICA 150 MG capsule Take 150 mg by mouth 2 (two) times daily.  12/05/16   [provider]  metformin (FORTAMET) 1000 MG (OSM) 24 hr tablet Take 1,000 mg by mouth at bedtime.    [provider]  naproxen sodium (ALEVE) 220 MG tablet Take 220 mg by mouth 2 (two) times daily as needed (headache/pain).    [provider]  pravastatin (PRAVACHOL) 20 MG tablet Take 20 mg by mouth at bedtime. 07/07/14   [provider]  zolpidem (AMBIEN) 10 MG tablet Take 10 mg by mouth at bedtime as needed for sleep.     [provider]    ALLERGY: Allergies  Allergen Reactions  . Metronidazole Nausea And Vomiting    REACTION: causes nausea and vomiting     Social History   Tobacco Use  . Smoking status: Current Every Day Smoker    Packs/day: 0.50    Years: 54.00    Pack years: 27.00    Types: Cigarettes  . Smokeless tobacco: Never Used  Substance Use Topics  . Alcohol use: Yes    Comment: occasional     Family History  Problem Relation Age of Onset  . Cancer Maternal Uncle        colon     ROS   Review of Systems  Constitutional: Negative.   HENT: Negative.   Eyes: Positive for blurred vision (chronic). Negative for double vision and photophobia.  Respiratory: Negative.   Cardiovascular: Negative.   Gastrointestinal: Negative for nausea and vomiting.  Genitourinary: Negative.  Musculoskeletal: Negative.   Skin: Negative.   Neurological: Negative for dizziness, tingling, tremors, sensory change, speech change, focal weakness, seizures, weakness and headaches.    Exam   Vitals:   05/04/18 0315 05/04/18 0400  BP: 135/86 133/70  Pulse:  65  Resp: 15   Temp:    SpO2:  98%   General appearance:   Elderly male in C-collar, resting comfortably in no apparent distress GCS:  [id number] Eyes: No scleral injection Cardiovascular: Regular rate and rhythm without murmurs, rubs, gallops. No edema or variciosities. Distal pulses normal. Pulmonary: Effort normal, non-labored breathing Musculoskeletal:     Muscle tone upper extremities: Normal    Muscle tone lower extremities: Normal    Motor exam: Upper Extremities Deltoid Bicep Tricep Grip  Right 5/5 5/5 5/5 5/5  Left 5/5 5/5 5/5 5/5   Lower Extremity IP Quad PF DF EHL  Right 5/5 5/5 5/5 5/5 5/5  Left 5/5 5/5 5/5 5/5 5/5   Neurological Mental Status:    - Patient is awake, alert, oriented to person, place, month, year    - amnestic to events surrounding the fall    - slurred speech Cranial Nerves    - II: Visual Fields are full. PERRL    - III/IV/VI: EOMI without ptosis or diploplia.     - V: Facial sensation is grossly normal    - VII: Facial movement is symmetric  but resting does have a mild droop to corner of left lip    - VIII: hearing is intact to voice    - X: Uvula elevates symmetrically    - XI: Shoulder shrug is symmetric.    - XII: tongue is midline without atrophy or fasciculations.  Sensory: Sensation grossly intact to LT Cerebellar    - FNF abnormal b/l   Results - Imaging/Labs   Results for orders placed or performed during the hospital encounter of 05/04/18 (from the past 48 hour(s))  Basic metabolic panel     Status: Abnormal   Collection Time: 05/04/18  3:00 AM  Result Value Ref Range   Sodium 136 135 - 145 mmol/L   Potassium 3.7 3.5 - 5.1 mmol/L   Chloride 101 98 - 111 mmol/L   CO2 24 22 - 32 mmol/L   Glucose, Bld 112 (H) 70 - 99 mg/dL   BUN 6 (L) 8 - 23 mg/dL   Creatinine, Ser 0.94 0.61 - 1.24 mg/dL   Calcium 8.3 (L) 8.9 - 10.3 mg/dL   GFR calc non Af Amer >60 >60 mL/min   GFR calc Af Amer >60 >60 mL/min   Anion gap 11 5 - 15    Comment: Performed at [facility], [place] [address] [place], [place] [address]  CBC with Differential/Platelet     Status: Abnormal   Collection Time: 05/04/18  3:00 AM  Result Value Ref Range   WBC 1.7 (L) 4.0 - 10.5 K/uL   RBC 3.90 (L) 4.22 - 5.81 MIL/uL   Hemoglobin 13.1 13.0 - 17.0 g/dL   HCT 37.6 (L) 39.0 - 52.0 %   MCV 96.4 80.0 - 100.0 fL   MCH 33.6 26.0 - 34.0 pg   MCHC 34.8 30.0 - 36.0 g/dL   RDW 13.5 11.5 - 15.5 %   Platelets 35 (L) 150 - 400 K/uL    Comment: REPEATED TO VERIFY PLATELET COUNT CONFIRMED BY SMEAR SPECIMEN CHECKED FOR CLOTS Immature Platelet Fraction may be clinically indicated, consider ordering this additional test [id number]  nRBC 0.0 0.0 - 0.2 %   Neutrophils Relative % 51 %   Neutro Abs 0.9 (L) 1.7 - 7.7 K/uL   Lymphocytes Relative 34 %   Lymphs Abs 0.6 (L) 0.7 - 4.0 K/uL   Monocytes Relative 11 %   Monocytes Absolute 0.2 0.1 - 1.0 K/uL   Eosinophils Relative 2 %   Eosinophils Absolute 0.0 0.0 - 0.5 K/uL   Basophils Relative 1 %   Basophils  Absolute 0.0 0.0 - 0.1 K/uL   Smear Review PLATELETS APPEAR DECREASED    Immature Granulocytes 1 %   Abs Immature Granulocytes 0.01 0.00 - 0.07 K/uL    Comment: Performed at [facility] [address], [place], [place] [address]  Protime-INR     Status: None   Collection Time: 05/04/18  3:00 AM  Result Value Ref Range   Prothrombin Time 14.8 11.4 - 15.2 seconds   INR 1.2 0.8 - 1.2    Comment: (NOTE) INR goal varies based on device and disease states. Performed at [facility], [place] [address], [place], [place] [address]   Ethanol     Status: Abnormal   Collection Time: 05/04/18  3:00 AM  Result Value Ref Range   Alcohol, Ethyl (B) 212 (H) <10 mg/dL    Comment: (NOTE) Lowest detectable limit for serum alcohol is 10 mg/dL. For medical purposes only. Performed at [facility], [place] [address] [place], [place] [address]   Type and screen     Status: None (Preliminary result)   Collection Time: 05/04/18  4:03 AM  Result Value Ref Range   ABO/RH(D) PENDING    Antibody Screen PENDING    Sample Expiration      05/07/2018 Performed at [facility], [place] [address], [place], [place] [address]     No results found.  Impression/Plan   73 y.o. 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  Although intoxicated he appears grossly neurologically intact.  While he does not require emergent neurosurgical intervention for evacuation of the subdural hematoma, he is at high risk for worsening due to thrombocytopenia from cirrhosis. He will be admitted to the neuro ICU for close observation.  Right hollow hemispheric subdural hematoma -  13 mm in maximal diameter with associated 8 mm right to left midline shift.  There is no ventricular trapping.  Do not believe he needs any emergent neurosurgical intervention. -  Neuro checks q.1 hour -  Repeat head CT later  today, sooner as indicated by neuro exam -  Keppra 500 mg b.i.d. x7 days for seizure prophylaxis   Right temporal lobe/left frontal lobe contusions -  as above  C2 Left lateral mass fracture  -  Fracture extends into the left transverse foramen.  Normal alignment maintained. -  CTA to rule out vertebral injury -  Aspen hard c collar    Thrombocytopenia - 33k -  Will transfuse 2 units  EtOH -  [place], PA-C [facility]

## 2018-05-04 NOTE — Progress Notes (Signed)
Pt close friend, Theme park manager, called RN for update. Informed RN that pt has one surviving family member and he will attempt to obtain her information.  Sid Baker can be reached at [phone] if necessary.

## 2018-05-04 NOTE — ED Triage Notes (Signed)
Pt comes via [facility] after ETOH use and fall in the prone position, LOC, pt now responsive, but confused

## 2018-05-04 NOTE — Progress Notes (Signed)
Inpatient Diabetes Program Recommendations  AACE/ADA: New Consensus Statement on Inpatient Glycemic Control (2015)  Target Ranges:  Prepandial:   less than 140 mg/dL      Peak postprandial:   less than 180 mg/dL (1-2 hours)      Critically ill patients:  140 - 180 mg/dL    Review of Glycemic Control  Diabetes history: DM 2 Outpatient Diabetes medications: Metformin 1000 mg Daily at supper Current orders for Inpatient glycemic control: Metformin Daily at supper  Inpatient Diabetes Program Recommendations:    Patient is NPO. Has Hx of DM. Patient ordered Metformin from home. Please d/c oral med and consider CBGs and Novolog 0-9 units Q4 hrs if needed.  Thanks,  Tama Headings RN, MSN, BC-ADM Inpatient Diabetes Coordinator Team Pager [phone] (8a-5p)

## 2018-05-04 NOTE — ED Notes (Signed)
Patient transported to CT 

## 2018-05-04 NOTE — Progress Notes (Signed)
Pt vomited large amount x 1. Likely that pt aspirated b/c laying flat when he started vomiting and desaturated to 68% on room air. [place], PA aware. Dr. Kathyrn Sheriff will evaluate pt. Pt SpO2 now 97% on RA, able to cough and clear airway.

## 2018-05-04 NOTE — ED Notes (Signed)
Neuro at bedside.

## 2018-05-05 ENCOUNTER — Inpatient Hospital Stay (HOSPITAL_COMMUNITY): Payer: Medicare Other

## 2018-05-05 LAB — BPAM PLATELET PHERESIS
Blood Product Expiration Date: 202004300935
Blood Product Expiration Date: 202004302359
ISSUE DATE / TIME: 202004290746
ISSUE DATE / TIME: 202004290941
Unit Type and Rh: 6200
Unit Type and Rh: 8400

## 2018-05-05 LAB — GLUCOSE, CAPILLARY: Glucose-Capillary: 106 mg/dL — ABNORMAL HIGH (ref 70–99)

## 2018-05-05 LAB — PREPARE PLATELET PHERESIS
Unit division: 0
Unit division: 0

## 2018-05-05 LAB — ABO/RH: ABO/RH(D): A POS

## 2018-05-05 MED ORDER — INSULIN ASPART 100 UNIT/ML ~~LOC~~ SOLN
0.0000 [IU] | Freq: Three times a day (TID) | SUBCUTANEOUS | Status: DC
  Administered 2018-05-06: 3 [IU] via SUBCUTANEOUS
  Administered 2018-05-07 – 2018-05-10 (×2): 2 [IU] via SUBCUTANEOUS

## 2018-05-05 MED ORDER — LORAZEPAM 2 MG/ML IJ SOLN
2.0000 mg | INTRAMUSCULAR | Status: DC | PRN
  Administered 2018-05-06 – 2018-05-08 (×5): 2 mg via INTRAVENOUS
  Filled 2018-05-05 (×3): qty 1
  Filled 2018-05-05: qty 2
  Filled 2018-05-05 (×2): qty 1

## 2018-05-05 MED ORDER — THIAMINE HCL 100 MG/ML IJ SOLN
100.0000 mg | Freq: Every day | INTRAMUSCULAR | Status: DC
  Administered 2018-05-05 – 2018-05-13 (×9): 100 mg via INTRAVENOUS
  Filled 2018-05-05 (×9): qty 2

## 2018-05-05 MED ORDER — FOLIC ACID 5 MG/ML IJ SOLN
1.0000 mg | Freq: Every day | INTRAMUSCULAR | Status: DC
  Administered 2018-05-05 – 2018-05-13 (×8): 1 mg via INTRAVENOUS
  Filled 2018-05-05 (×12): qty 0.2

## 2018-05-05 MED ORDER — INSULIN ASPART 100 UNIT/ML ~~LOC~~ SOLN: 0.0000 [IU] | SUBCUTANEOUS | Status: DC | PRN

## 2018-05-05 NOTE — Evaluation (Signed)
Clinical/Bedside Swallow Evaluation Patient Details  Name: EJAY LASHLEY MRN: [id number] Date of Birth: 10/05/1945  Today's Date: 05/05/2018 Time: SLP Start Time (ACUTE ONLY): 6295 SLP Stop Time (ACUTE ONLY): 1452 SLP Time Calculation (min) (ACUTE ONLY): 10 min  Past Medical History:  Past Medical History:  Diagnosis Date  . Anxiety   . Benign localized prostatic hyperplasia with lower urinary tract symptoms (LUTS)   . Depression   . Diverticulosis of colon   . GERD (gastroesophageal reflux disease)   . History of colitis   . History of colonic diverticulitis    03-10-2006 s/p bowel resection   . 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 Hypertension   . Hypertriglyceridemia   . NAFLD (nonalcoholic fatty liver disease)   . OA (osteoarthritis)    knees  . Peripheral neuropathy   . Prostate cancer [facility]) urologist-- dr Alyson Ingles  oncologist- dr Tammi Klippel   dx 12-02-2016-- Stage T1c, Gleason 3+4,  PSA 8.2, 37.8cc-- scheduled for radioactive seed implants 04-16-2017  . S/P insertion of IVC (inferior vena caval) filter 07/02/2006   due to bilateral fermoral dvt  . Seasonal allergies   . Thrombocytopenia ([place])   . Type 2 diabetes mellitus ([place])    followed by pcp  . Weak urinary stream   . Wears glasses    Past Surgical History:  Past Surgical History:  Procedure Laterality Date  . CATARACT EXTRACTION W/ INTRAOCULAR LENS  IMPLANT, BILATERAL  2010  . CHOLECYSTECTOMY OPEN  1973   and APPENDECTOMY  . COLONOSCOPY N/A 06/01/2012   Procedure: COLONOSCOPY;  Surgeon: Rogene Houston, MD;  [facility];  Service: Endoscopy;  Laterality: N/A;  930-moved to 1025 Ann to notify pt  . IVC FILTER INSERTION  07/02/2006  . PROSTATE BIOPSY  12-02-2016   dr Alyson Ingles office  . RADIOACTIVE SEED IMPLANT N/A 04/16/2017   Procedure: RADIOACTIVE SEED IMPLANT/BRACHYTHERAPY IMPLANT;  Surgeon:  Cleon Gustin, MD;  [facility];  Service: Urology;  Laterality: N/A;  . SIGMOID RESECTION W/ STAPLED SIDE TO SIDE ANASTOMOSIS/ DRAINAGE ABSCESS  03-10-2006  dr bradford [facility]  . SPACE OAR INSTILLATION N/A 04/16/2017   Procedure: SPACE OAR INSTILLATION;  Surgeon: Cleon Gustin, MD;  [facility];  Service: Urology;  Laterality: N/A;   HPI:  TREYDEN HAKIM is a 73 y.o. male with history of diabetes, GERD, hyperlipidemia, hypertension, prostate cancer, cirrhosis who was brought to ED after a fall. He underwent CT head/C spine which is significant for an acute right SDH and C2 fracture. Repeat CT 4/30 revealed worsening findings: Some increased blooming of the right temporal intraparenchymal hematoma, measuring 5 x 3.3 x 2.2 cm today compared with 4.4 x 2.7 x 1.9 cm on the immediate previous film. Slight increase in size of the left frontoparietal vertex contusions, increased by a mm or 2, measuring 7 and 10 mm presently. CXR No acute cardiopulmonary disease   Assessment / Plan / Recommendation Clinical Impression  No identifiable CN impairments with lingual/labial movements. Self fed sips from 3 oz water but halted after 2-3 sips; second attempt resulted in strong cough and reddened face suspicious of poor laryngeal protection. Applesauce was unremarkable and pt may have crushed meds if ordered and several ice chips following oral care; otherwise should remain NPO until MBS tomorrow.    SLP Visit Diagnosis: Dysphagia, unspecified (R13.10)  Aspiration Risk  Moderate aspiration risk    Diet Recommendation NPO except meds;Ice chips PRN after oral care   Medication Administration: Crushed with puree Supervision: Full supervision/cueing for compensatory strategies;Patient able to self feed Compensations: Slow rate;Small sips/bites Postural Changes: Seated upright at 90 degrees    Other  Recommendations Oral Care Recommendations: Oral  care QID   Follow up Recommendations Other (comment)(TBD)      Frequency and Duration            Prognosis        Swallow Study   General HPI: SURYA SCHROETER is a 73 y.o. male with history of diabetes, GERD, hyperlipidemia, hypertension, prostate cancer, cirrhosis who was brought to ED after a fall. He underwent CT head/C spine which is significant for an acute right SDH and C2 fracture. Repeat CT 4/30 revealed worsening findings: Some increased blooming of the right temporal intraparenchymal hematoma, measuring 5 x 3.3 x 2.2 cm today compared with 4.4 x 2.7 x 1.9 cm on the immediate previous film. Slight increase in size of the left frontoparietal vertex contusions, increased by a mm or 2, measuring 7 and 10 mm presently. CXR No acute cardiopulmonary disease Type of Study: Bedside Swallow Evaluation Previous Swallow Assessment: (none) Diet Prior to this Study: NPO Temperature Spikes Noted: No Respiratory Status: Room air History of Recent Intubation: No Behavior/Cognition: Alert;Pleasant mood;Cooperative;Requires cueing Oral Cavity Assessment: Within Functional Limits Oral Care Completed by SLP: No Oral Cavity - Dentition: Edentulous Vision: Functional for self-feeding Self-Feeding Abilities: Able to feed self;Needs set up Patient Positioning: Upright in bed Baseline Vocal Quality: Normal Volitional Cough: Strong Volitional Swallow: Able to elicit    Oral/Motor/Sensory Function Overall Oral Motor/Sensory Function: Within functional limits   Ice Chips Ice chips: Within functional limits Presentation: Spoon   Thin Liquid Thin Liquid: Impaired Presentation: Cup Pharyngeal  Phase Impairments: Cough - Immediate    Nectar Thick Nectar Thick Liquid: Not tested   Honey Thick Honey Thick Liquid: Not tested   Puree Puree: Within functional limits   Solid     Solid: Not tested      [name] 05/05/2018,3:08 PM   Orbie Pyo [place].Ed  Risk analyst [phone] Office [phone]

## 2018-05-05 NOTE — Progress Notes (Addendum)
  NEUROSURGERY PROGRESS NOTE   No issues overnight.  Complains of persistent headache and nausea Denies weakness in extremities  EXAM:  BP (!) 143/80   Pulse 71   Temp 98.8 F (37.1 C) (Oral)   Resp (!) 22   SpO2 98%   Awake, alert, oriented  Speech fluent, appropriate  CN grossly intact  MAEW, nonfocal Mild left drift  IMPRESSION/PLAN 73 y.o. male with right SDH/temporal contusions and C2 fracture s/p fall. Concussed but neurologically intact. - Continue supportive care - PT/OT/SLP - Okay to transfer to stepdown  [name], [facility]

## 2018-05-05 NOTE — Progress Notes (Signed)
Call to [phone] on call DR Vertell Limber for clarification of oder for CBG's written CBG q 2 hrs. Dr Vertell Limber returned call and orders clarified.

## 2018-05-05 NOTE — Plan of Care (Signed)
  Problem: Clinical Measurements: Goal: Ability to maintain clinical measurements within normal limits will improve Outcome: Progressing Goal: Respiratory complications will improve Outcome: Progressing   Problem: Elimination: Goal: Will not experience complications related to urinary retention Outcome: Progressing   Problem: Safety: Goal: Ability to remain free from injury will improve Outcome: Progressing

## 2018-05-06 ENCOUNTER — Inpatient Hospital Stay (HOSPITAL_COMMUNITY): Payer: Medicare Other

## 2018-05-06 LAB — GLUCOSE, CAPILLARY
Glucose-Capillary: 110 mg/dL — ABNORMAL HIGH (ref 70–99)
Glucose-Capillary: 123 mg/dL — ABNORMAL HIGH (ref 70–99)
Glucose-Capillary: 120 mg/dL — ABNORMAL HIGH (ref 70–99)
Glucose-Capillary: 87 mg/dL (ref 70–99)

## 2018-05-06 MED ORDER — HALOPERIDOL LACTATE 5 MG/ML IJ SOLN
5.0000 mg | Freq: Four times a day (QID) | INTRAMUSCULAR | Status: DC | PRN
  Administered 2018-05-06 – 2018-05-18 (×16): 5 mg via INTRAVENOUS
  Filled 2018-05-06 (×17): qty 1

## 2018-05-06 NOTE — Progress Notes (Signed)
PT Cancellation Note  Patient Details Name: [name] MRN: [id number] DOB: 26-Mar-1945   Cancelled Treatment:    Reason Eval/Treat Not Completed: Other (comment). Pt lethargic after recently receiving Haldol; RN requesting hold.  Ellamae Sia, PT, DPT Acute Rehabilitation Services Pager [phone] Office [phone]    Willy Eddy 05/06/2018, 8:53 AM

## 2018-05-06 NOTE — Progress Notes (Signed)
  NEUROSURGERY PROGRESS NOTE   Agitation requiring haldol and restraints overnight Drowsy from medications this am but easily awakened Complains of headache  EXAM:  BP (!) 158/80 (BP Location: Left Arm)   Pulse 90   Temp 98.5 F (36.9 C) (Oral)   Resp 20   SpO2 94%   Drowsy but easily awakened Oriented to self but not year/location/month Speech slow CN grossly intact  MAEW, nonfocal  IMPRESSION/PLAN 73 y.o. male with right SDH/temporal contusions and C2 fracture s/p fall. Stable neurologically - Continue supportive care, aspen c collar - SLP: immediate cough during swallow evaluation. Rec NPO until MBS today. Will await results - PT/OT  Ferne Reus, PA-C [facility]

## 2018-05-06 NOTE — Evaluation (Signed)
Physical Therapy Evaluation Patient Details Name: [name] MRN: [id number] DOB: [birth year] Today's Date: 05/06/2018   History of Present Illness  [name] is a 73 y.o. male With history of diabetes, hyperlipidemia, hypertension, prostate cancer, cirrhosis who was brought to ED after a fall. By report he was drinking with a friend when he fell forward. Patient is amnestic to events surrounding the fall. He underwent CT head/C spine which is significant for an acute right SDH and C2 fracture (managed with Aspen collar).  Clinical Impression  Pt admitted with above diagnosis. Pt currently with functional limitations due to the deficits listed below (see PT Problem List). Pt presents with decreased mobility secondary to decreased cognition, poor balance, and weakness. Not oriented to self, but following some simple commands and more alert post mobility. Ambulating x 15 feet with two person moderate assistance. Pt will benefit from skilled PT to increase their independence and safety with mobility to allow discharge to the venue listed below.       Follow Up Recommendations SNF;Supervision/Assistance - 24 hour    Equipment Recommendations  Other (comment)(tbd)    Recommendations for Other Services       Precautions / Restrictions Precautions Precautions: Fall;Cervical Precaution Booklet Issued: No Required Braces or Orthoses: Cervical Brace Cervical Brace: Hard collar;At all times Restrictions Weight Bearing Restrictions: No      Mobility  Bed Mobility Overal bed mobility: Needs Assistance Bed Mobility: Supine to Sit     Supine to sit: Mod assist;+2 for physical assistance;HOB elevated     General bed mobility comments: Pt moved legs partially off of bed and did A some with trunk  Transfers Overall transfer level: Needs assistance Equipment used: 2 person hand held assist Transfers: Sit to/from Stand Sit to Stand: Mod assist;+2 physical assistance          General transfer comment: with tendency to be back on his heels  Ambulation/Gait Ambulation/Gait assistance: Mod assist;+2 physical assistance Gait Distance (Feet): 15 Feet Assistive device: 2 person hand held assist Gait Pattern/deviations: Step-through pattern;Decreased stride length;Shuffle Gait velocity: decreased Gait velocity interpretation: <1.31 ft/sec, indicative of household ambulator General Gait Details: Slow, shuffling gait with cues for motor initiation and guidance for direction. Tendency for mild retropulsion and very stiff  Stairs            Wheelchair Mobility    Modified Rankin (Stroke Patients Only)       Balance Overall balance assessment: Needs assistance Sitting-balance support: No upper extremity supported;Feet supported Sitting balance-Leahy Scale: Fair     Standing balance support: Bilateral upper extremity supported Standing balance-Leahy Scale: Poor Standing balance comment: posterior lean                             Pertinent Vitals/Pain Pain Assessment: Faces Faces Pain Scale: Hurts little more Pain Location: left toes when donning socks Pain Descriptors / Indicators: Grimacing Pain Intervention(s): Monitored during session    Home Living Family/patient expects to be discharged to:: Skilled nursing facility Living Arrangements: Alone                    Prior Function Level of Independence: Independent               Hand Dominance   Dominant Hand: Right    Extremity/Trunk Assessment   Upper Extremity Assessment Upper Extremity Assessment: Defer to OT evaluation    Lower Extremity Assessment Lower Extremity Assessment:  Generalized weakness       Communication   Communication: No difficulties  Cognition Arousal/Alertness: Lethargic;Suspect due to medications Behavior During Therapy: Flat affect Overall Cognitive Status: No family/caregiver present to determine baseline cognitive functioning                                  General Comments: Not oriented to place [name], not oriented to self (repeated name of PT--[name] when asked, but did respond to his name), not oriented to year (2004), followed one step commands with increased time and/or additional tactile cues      General Comments      Exercises     Assessment/Plan    PT Assessment Patient needs continued PT services  PT Problem List Decreased strength;Decreased activity tolerance;Decreased balance;Decreased mobility;Decreased coordination;Decreased cognition;Decreased safety awareness;Decreased knowledge of precautions       PT Treatment Interventions DME instruction;Gait training;Functional mobility training;Therapeutic activities;Therapeutic exercise;Balance training;Patient/family education    PT Goals (Current goals can be found in the Care Plan section)  Acute Rehab PT Goals Patient Stated Goal: to drink water PT Goal Formulation: With patient Time For Goal Achievement: 05/20/18 Potential to Achieve Goals: Fair    Frequency Min 3X/week   Barriers to discharge        Co-evaluation PT/OT/SLP Co-Evaluation/Treatment: Yes Reason for Co-Treatment: Complexity of the patient's impairments (multi-system involvement);Necessary to address cognition/behavior during functional activity;For patient/therapist safety;To address functional/ADL transfers PT goals addressed during session: Mobility/safety with mobility;Balance OT goals addressed during session: ADL's and self-care;Strengthening/ROM       AM-PAC PT "6 Clicks" Mobility  Outcome Measure Help needed turning from your back to your side while in a flat bed without using bedrails?: A Lot Help needed moving from lying on your back to sitting on the side of a flat bed without using bedrails?: A Lot Help needed moving to and from a bed to a chair (including a wheelchair)?: A Lot Help needed standing up from a  chair using your arms (e.g., wheelchair or bedside chair)?: A Lot Help needed to walk in hospital room?: A Lot Help needed climbing 3-5 steps with a railing? : Total 6 Click Score: 11    End of Session Equipment Utilized During Treatment: Gait belt;Cervical collar Activity Tolerance: Patient tolerated treatment well Patient left: in bed;with call bell/phone within reach;with bed alarm set;with restraints reapplied Nurse Communication: Mobility status PT Visit Diagnosis: Unsteadiness on feet (R26.81);Other abnormalities of gait and mobility (R26.89);History of falling (Z91.81);Muscle weakness (generalized) (M62.81);Difficulty in walking, not elsewhere classified (R26.2)    Time: 3785-8850 PT Time Calculation (min) (ACUTE ONLY): 19 min   Charges:   PT Evaluation $PT Eval Moderate Complexity: [place], [place], DPT Acute Rehabilitation Services Pager [phone] Office [place] 05/06/2018, 12:12 PM

## 2018-05-06 NOTE — Progress Notes (Signed)
Modified Barium Swallow Progress Note  Patient Details  Name: [name] MRN: [id number] Date of Birth: 11-04-1945  Today's Date: 05/06/2018  Modified Barium Swallow completed.  Full report located under Chart Review in the Imaging Section.  Brief recommendations include the following:  Clinical Impression  Patient's mentation negatively impacted the accuracy of the study.  He was fully alert 20 minutes prior sitting up in his bed with PT/OT however was sleepy during testing.  SlP proceeded with test as pt had been insisting on consuming water.  He currently presents with moderate oropharyngeal dysphagia due to sensorimotor deficits.  Decreased lingual coordination results in premature spillage with all boluses and delayed oral transiting/lingual pumping with puree.  He aspirated with thin prior to swallow as barium pooled at pyriform sinus and spilled into open airway.  Aspiration followed by weak nonproductive cough.  Pt with only mild resdiuals but residuals mix with secretions and are often penetrated and subsequently silented aspirated.  Verbal cues and dry spoon lingual stimulation did not elicit dry swallow, trace barium on spoon effective for swallow.    At this time due to pt's mentation, recommend he remain npo with oral care.  When he fully awakens would recommend to start a dys1/nectar diet with occasional oral suctioning before, during and after intake.  Recommend pt be allowed tsps of thin water between meals only after oral care/suction for his comfort, no other intake with the water.  Will follow up for dysphagia management     MD please order cognitive linguistic evaluation if you agree.  Thanks.    Swallow Evaluation Recommendations       SLP Diet Recommendations: Dysphagia 1 (Puree) solids;Nectar thick liquid;Free water protocol after oral care(tsps of thin water between meals after oral care)   Liquid Administration via: Cup   Medication Administration: Whole meds  with puree(or via alternative means if not alert)   Supervision: Full assist for feeding   Compensations: Slow rate;Small sips/bites;Other (Comment)(hard cough if reflexively coughing, oral suction t/o meal)   Postural Changes: Seated upright at 90 degrees;Remain semi-upright after after feeds/meals (Comment)   Oral Care Recommendations: Oral care before and after PO;Other (Comment)(oral suction intermittently during meal)   Other Recommendations: Order thickener from pharmacy;Have oral suction available   Luanna Salk, [facility] SLP Acute Rehab Services Pager [phone] Office [phone]  Macario Golds 05/06/2018,11:16 AM

## 2018-05-06 NOTE — Evaluation (Signed)
Occupational Therapy Evaluation Patient Details Name: [name] MRN: [id number] DOB: 1945-03-17 Today's Date: 05/06/2018    History of Present Illness [name] is a 73 y.o. male With history of diabetes, hyperlipidemia, hypertension, prostate cancer, cirrhosis who was brought to ED after a fall. By report he was drinking with a friend when he fell forward. Patient is amnestic to events surrounding the fall. He underwent CT head/C spine which is significant for an acute right SDH and C2 fracture (managed with Aspen collar).   Clinical Impression   This 73 yo male admitted with above presents to acute OT with decreased cognition, decreased balance, decreased command following all affecting his safety and independence with basic ADLs. He will benefit from acute OT with follow up OT at SNF.    Follow Up Recommendations  SNF;Supervision/Assistance - 24 hour    Equipment Recommendations  Other (comment)(TBD next venue)       Precautions / Restrictions Precautions Precautions: Fall;Cervical Precaution Booklet Issued: No Required Braces or Orthoses: Cervical Brace Cervical Brace: Hard collar;At all times Restrictions Weight Bearing Restrictions: No      Mobility Bed Mobility Overal bed mobility: Needs Assistance Bed Mobility: Supine to Sit     Supine to sit: Mod assist;+2 for physical assistance;HOB elevated     General bed mobility comments: Pt moved legs partially off of bed and did A some with trunk  Transfers Overall transfer level: Needs assistance Equipment used: 2 person hand held assist Transfers: Sit to/from Stand Sit to Stand: Mod assist;+2 physical assistance         General transfer comment: with tendency to be back on his heels    Balance Overall balance assessment: Needs assistance Sitting-balance support: No upper extremity supported;Feet supported Sitting balance-Leahy Scale: Fair     Standing balance support: Bilateral upper extremity  supported Standing balance-Leahy Scale: Poor Standing balance comment: posterior lean                           ADL either performed or assessed with clinical judgement   ADL Overall ADL's : Needs assistance/impaired Eating/Feeding: NPO   Grooming: Minimal assistance;Moderate assistance;Sitting   Upper Body Bathing: Moderate assistance;Sitting   Lower Body Bathing: Maximal assistance Lower Body Bathing Details (indicate cue type and reason): Mod A +2 sit<>stand Upper Body Dressing : Maximal assistance;Sitting   Lower Body Dressing: Total assistance Lower Body Dressing Details (indicate cue type and reason): Mod A +2 sit<>stand Toilet Transfer: Moderate assistance;+2 for physical assistance;Ambulation;Comfort height toilet;Grab bars Toilet Transfer Details (indicate cue type and reason): Bil HHA, use of gait belt, VCs/tactile cues to move in directon he needed to to get to and from toilet Toileting- Clothing Manipulation and Hygiene: Total assistance Toileting - Clothing Manipulation Details (indicate cue type and reason): Mod A +2 sit<>stand                          Pertinent Vitals/Pain Pain Assessment: No/denies pain     Hand Dominance Right   Extremity/Trunk Assessment Upper Extremity Assessment Upper Extremity Assessment: Overall WFL for tasks assessed(did note in left hand muscle wasting in web space of hand)   Lower Extremity Assessment Lower Extremity Assessment: Defer to PT evaluation       Communication Communication Communication: No difficulties   Cognition Arousal/Alertness: Lethargic;Suspect due to medications Behavior During Therapy: Flat affect Overall Cognitive Status: No family/caregiver present to determine baseline cognitive functioning  General Comments: Not oriented to place [name] Dennisbut he is from [place]), not oriented to self (repeated name of PT--Caroline when asked, but  did respond to his name), not oriented to year (2004), followed one step commands with increased time and/or additional tactile cues              Home Living Family/patient expects to be discharged to:: Skilled nursing facility Living Arrangements: Alone                                      Prior Functioning/Environment Level of Independence: Independent                 OT Problem List: Decreased strength;Decreased range of motion;Impaired balance (sitting and/or standing);Decreased cognition;Decreased safety awareness;Decreased activity tolerance      OT Treatment/Interventions: Self-care/ADL training;Balance training;DME and/or AE instruction;Patient/family education;Cognitive remediation/compensation    OT Goals(Current goals can be found in the care plan section) Acute Rehab OT Goals Patient Stated Goal: to go home OT Goal Formulation: With patient Time For Goal Achievement: 05/20/18 Potential to Achieve Goals: Good  OT Frequency: Min 2X/week   Barriers to D/C: Decreased caregiver support          Co-evaluation PT/OT/SLP Co-Evaluation/Treatment: Yes Reason for Co-Treatment: Necessary to address cognition/behavior during functional activity;For patient/therapist safety PT goals addressed during session: Mobility/safety with mobility;Balance;Strengthening/ROM OT goals addressed during session: ADL's and self-care;Strengthening/ROM      AM-PAC OT "6 Clicks" Daily Activity     Outcome Measure Help from another person eating meals?: Total(NPO) Help from another person taking care of personal grooming?: A Lot Help from another person toileting, which includes using toliet, bedpan, or urinal?: Total Help from another person bathing (including washing, rinsing, drying)?: A Lot Help from another person to put on and taking off regular upper body clothing?: A Lot Help from another person to put on and taking off regular lower body clothing?: Total 6  Click Score: 9   End of Session Equipment Utilized During Treatment: Gait belt;Cervical collar Nurse Communication: (pt pulled off condom cath with tubing between his toes, when we entered both mitts where laying in bed beside pt)  Activity Tolerance: Patient limited by lethargy Patient left: in bed;with call bell/phone within reach(transport there to take him down for swallow test)  OT Visit Diagnosis: Unsteadiness on feet (R26.81);Other abnormalities of gait and mobility (R26.89);Muscle weakness (generalized) (M62.81);History of falling (Z91.81);Other symptoms and signs involving cognitive function                Time: 7782-4235 OT Time Calculation (min): 22 min Charges:  OT General Charges $OT Visit: 1 Visit OT Evaluation $OT Eval Moderate Complexity: [place], OTR/L Acute [employer] Pager [phone] Office [phone]     Almon Register 05/06/2018, 10:42 AM

## 2018-05-06 NOTE — Progress Notes (Signed)
Patient woke up pulling condom cath with bilateral mittens off and right 2nd and 3rd toe slightly bleeding(cleaned with pink foam applied).Patient stating he has to get up now .Call to Dr Vertell Limber with report of patient's stratus and previous Ativan 2 mg given.  Orders received. Will continue to monitor.

## 2018-05-06 NOTE — Evaluation (Signed)
SLP Cancellation Note  Patient Details Name: [name] MRN: [id number] DOB: 16-Apr-1945   Cancelled treatment:       Reason Eval/Treat Not Completed: Other (comment);Fatigue/lethargy limiting ability to participate(pt had a difficult night with agitation and was given Haldol, is currently too lethargic for MBS, RN also reports pt coughing with applesauce, requested RN page SLP if he awakens adequately for MBS)   Macario Golds 05/06/2018, 8:36 AM   Luanna Salk, [place] SLP [place] Pager [phone] Office [phone]

## 2018-05-06 NOTE — Plan of Care (Signed)

## 2018-05-06 NOTE — Progress Notes (Signed)
Patient pulled out IV and pulled off condom cath stating he wanted to get out of bed after incontinence of stool. IV team in to restart IV and patient given 2mg  Ativan to help relieve anxiety and reorient patient to situation. Mittens applied t both hands again. Bath given to patient. 0205 patient now resting much better with soft music playing at the bedside. 148/74, 78, 93%, 16. Will continue to monitor

## 2018-05-07 ENCOUNTER — Inpatient Hospital Stay (HOSPITAL_COMMUNITY): Payer: Medicare Other

## 2018-05-07 LAB — GLUCOSE, CAPILLARY
Glucose-Capillary: 80 mg/dL (ref 70–99)
Glucose-Capillary: 126 mg/dL — ABNORMAL HIGH (ref 70–99)
Glucose-Capillary: 69 mg/dL — ABNORMAL LOW (ref 70–99)
Glucose-Capillary: 79 mg/dL (ref 70–99)
Glucose-Capillary: 80 mg/dL (ref 70–99)

## 2018-05-07 MED ORDER — NICOTINE 14 MG/24HR TD PT24
14.0000 mg | MEDICATED_PATCH | Freq: Every day | TRANSDERMAL | Status: DC
  Administered 2018-05-07 – 2018-05-24 (×18): 14 mg via TRANSDERMAL
  Filled 2018-05-07 (×18): qty 1

## 2018-05-07 NOTE — Progress Notes (Addendum)
NEUROSURGERY PROGRESS NOTE  No acute events overnight, restraints for agitation. No complaints of headache. Drowsy but awakened easily. Continue collar and supportive care.  ST recommend continue to be NPO.  Chronic smoker, ordered nicotine patch Continue therapies. Awaiting SNF placement.  Temp:  [97.8 F (36.6 C)-99.4 F (37.4 C)] 97.8 F (36.6 C) (05/02 0800) Pulse Rate:  [82-97] 89 (05/02 0800) Resp:  [17-27] 22 (05/02 0800) BP: (132-172)/(64-96) 152/96 (05/02 0800) SpO2:  [93 %-99 %] 98 % (05/02 0800)  Eleonore Chiquito, NP 05/07/2018 8:48 AM

## 2018-05-07 NOTE — Progress Notes (Signed)
Assisted tele visit to patient with family.  Doniqua Saxby P, RN  

## 2018-05-07 NOTE — Progress Notes (Signed)
1949: On assessment, pt drowsy and responding to pain. Pt given Ativan and Haldol earlier in afternoon. Will continue to monitor.    2227: Paged on-call provider about breathing and that pt is still drowsy. Advised to hold ativan and received order for chest xray. Will continue to monitor.  0681: Paged on-call provider about cxr results. Advised that we will wait until morning for evaluation and possible lateral cxr. Received order for nasopharyngeal suctioning.   [phone]: Pt had 3-beat run of V-Tach. Paged on-call. Received order for BMP. Will continue to monitor.

## 2018-05-07 NOTE — Progress Notes (Signed)
SLP Cancellation Note  Patient Details Name: [name] MRN: [id number] DOB: October 08, 1945   Cancelled treatment:       Reason Eval/Treat Not Completed: Patient's level of consciousness. Per RN, patient continues with confusion and agitation and has been on halidol, etc, leading to difficulty with alertness.    Nadara Mode Tarrell 05/07/2018, 5:02 PM    Sonia Baller, MA, [place] [facility] Pager: [phone]

## 2018-05-07 NOTE — Progress Notes (Signed)
CSW submitted PASRR request and noted it was flagged for manual nurse review. CSW will follow and update as it progresses.  Lamonte Richer, LCSW, [place] [facility] [phone]

## 2018-05-07 NOTE — Progress Notes (Signed)
CSW met with patient to begin [facility] assessment to work towards possible SNF placement. CSW noted patient was awake upon entering and stated to CSW he was tired and proceeded to snore. CSW will follow-up in afternoon to engage patient in assessment and to gather consent for referral.  Lamonte Richer, LCSW, [place] [facility] [phone]

## 2018-05-07 NOTE — TOC Initial Note (Signed)
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 CSW spoke with patient's emergency contact, his niece, who is his next of kin and only currently known family contact. CSW reviewed SNF to niece and recommendation. CSW informed niece of limitations that patient would need to provide consent to disclose his information in referrals to SNF providers and at this time patient is unable too. CSW noted niece stated she believes she may be the [facility] and will look for paperwork. CSW reviewed Medicare.gov nursing home compare and noted that per family preference would be for patient to be near [place] near his friends or near [facility] as niece regularly passes by hospital for work. CSW notes niece is unable to visit patient for more than a couple times a week due to her working full-time and notes per her patient has no additional close friends or family that could support. CSW notes patient mentioned in the past a preference to [facility]. CSW will continue to follow for point when patient can provide consent to be referred out or is patient's niece provides HCPOA paperwork and provides consent.       Expected Discharge Plan: Skilled Nursing Facility Barriers to Discharge: Other (comment)(Patient unable to provide consent due to cognitive status and no current established HCPOA/legal guardian.)   Patient Goals and CMS Choice Patient states their goals for this hospitalization and ongoing recovery are:: Patient was unable to speak to CSW due to TBI. CMS Medicare.gov Compare Post Acute Care list provided to:: Other (Comment Required)(Not provided at this time  as patient has been unable to consent to being referred to SNF facilities. ) Choice offered to / list presented to : NA  Expected Discharge Plan and Services Expected Discharge Plan: [place] arrangements for the past 2 months: Apartment                                      Prior Living Arrangements/Services Living arrangements for the past 2 months: Apartment Lives with:: Self Patient language and need for interpreter reviewed:: No Do you feel safe going back to the place where you live?: No(Patient unable to answer, family/friends state no.)   Patient unable to answer, family/friends state no.  Need for Family Participation in Patient Care: Yes (Comment) Care giver support system in place?: No (comment)   Criminal Activity/Legal Involvement Pertinent to Current Situation/Hospitalization: No - Comment as needed  Activities of Daily Living      Permission Sought/Granted Permission sought to share information with : Family Supports Permission granted to share information with : Yes, Verbal Permission Granted(Patient provied emergency contact)  Share Information with NAME: Alicia Amel        Permission granted to share info w Contact Information: [phone]  Emotional Assessment Appearance:: Appears stated age Attitude/Demeanor/Rapport: Unable to Assess Affect (typically observed): Unable to Assess Orientation: : Oriented to Self Alcohol / Substance Use: Alcohol Use Psych Involvement: No (comment)  Admission diagnosis:  Subdural hematoma ([place]) [S06.5X9A] Thrombocytopenia (HCC) [D69.6] SAH (subarachnoid hemorrhage) (HCC) [M84.1] Alcoholic intoxication  with complication ([place]) [X11.552] Closed displaced fracture of second cervical vertebra, unspecified fracture morphology, initial encounter [facility]) [S12.100A] Patient Active Problem List   Diagnosis Date Noted  . Subdural hematoma ([place]) 05/04/2018  . Prostate cancer ([place])  04/16/2017  . Malignant neoplasm of prostate ([place]) 01/07/2017  . 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  . Diverticulosis 07/19/2013  . DIARRHEA 08/22/2008  . COLITIS 08/21/2008  . DIVERTICULITIS, COLON 08/21/2008  . DM 08/20/2008  . HYPERLIPIDEMIA 08/20/2008  . SMOKER 08/20/2008  . HYPERTENSION 08/20/2008  . EXTERNAL HEMORRHOIDS 08/20/2008  . COLONIC POLYPS, ADENOMATOUS, HX OF 08/20/2008   PCP:  Celene Squibb, MD Pharmacy:   [place], [place] - [address] [place] [address] [place] [address] Phone: [phone] Fax: [place], [address] [address] AT [place]. [place] [address] Phone: [phone] Fax: [phone]  [place], [place] [address] Phone: [phone] Fax: [phone]     Social Determinants of Health (SDOH) Interventions    Readmission Risk Interventions No flowsheet data found.

## 2018-05-07 NOTE — NC FL2 (Signed)
[place] LEVEL OF CARE SCREENING TOOL     IDENTIFICATION  Patient Name: [name] Birthdate: 08-Jan-1945 Sex: male Admission Date (Current Location): 05/04/2018  [facility] and [place] Number:  Herbalist and Address:  [facility] [place] [place] [address], [place], [place] [address]      Provider Number: [id number]  Attending Physician Name and Address:  Consuella Lose, MD  Relative Name and Phone Number:  Alicia Amel [phone]    Current Level of Care: Hospital Recommended Level of Care: [place] Prior Approval Number:    Date Approved/Denied:   PASRR Number: Pending  Discharge Plan: Home    Current Diagnoses: Patient Active Problem List   Diagnosis Date Noted  . Subdural hematoma ([place]) 05/04/2018  . Prostate cancer ([place]) 04/16/2017  . Malignant neoplasm of prostate ([place]) 01/07/2017  . 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  . Diverticulosis 07/19/2013  . DIARRHEA 08/22/2008  . COLITIS 08/21/2008  . DIVERTICULITIS, COLON 08/21/2008  . DM 08/20/2008  . HYPERLIPIDEMIA 08/20/2008  . SMOKER 08/20/2008  . HYPERTENSION 08/20/2008  . EXTERNAL HEMORRHOIDS 08/20/2008  . COLONIC POLYPS, ADENOMATOUS, HX OF 08/20/2008    Orientation RESPIRATION BLADDER Height & Weight     Self  O2(4L) Incontinent Weight:   Height:     BEHAVIORAL SYMPTOMS/MOOD NEUROLOGICAL BOWEL NUTRITION STATUS  Dangerous to self, others or property   Incontinent Diet(NPO)  AMBULATORY STATUS COMMUNICATION OF NEEDS Skin   Extensive Assist Verbally(Impaired somewhat) Normal                       Personal Care Assistance Level of Assistance  Bathing, Feeding, Dressing Bathing Assistance: Maximum assistance Feeding assistance: Maximum assistance Dressing Assistance: Maximum assistance     Functional Limitations Info             SPECIAL CARE FACTORS FREQUENCY  PT (By licensed PT), OT (By licensed OT)      PT Frequency: 5x weekly OT Frequency: 5x weekly            Contractures Contractures Info: Not present    Additional Factors Info                  Current Medications (05/07/2018):  This is the current hospital active medication list Current Facility-Administered Medications  Medication Dose Route Frequency Provider Last Rate Last Dose  . 0.9 %  sodium chloride infusion (Manually program via Guardrails IV Fluids)   Intravenous Once Costella, Vincent J, PA-C      . 0.9 %  sodium chloride infusion  250 mL Intravenous PRN Costella, Vincent J, PA-C      . 0.9 %  sodium chloride infusion   Intravenous Continuous Costella, Vista Mink, PA-C 100 mL/hr at 05/06/18 0141    . acetaminophen (TYLENOL) tablet 650 mg  650 mg Oral Q6H PRN Traci Sermon, PA-C   650 mg at 05/05/18 2458   Or  . acetaminophen (TYLENOL) suppository 650 mg  650 mg Rectal Q6H PRN Traci Sermon, PA-C   650 mg at 05/04/18 2121  . bisacodyl (DULCOLAX) EC tablet 5 mg  5 mg Oral Daily PRN Costella, Vista Mink, PA-C      . calcium carbonate (TUMS - dosed in mg elemental calcium) chewable tablet 200 mg of elemental calcium  1 tablet Oral PRN Costella, Vincent J, PA-C      . chlorhexidine (PERIDEX) 0.12 % solution 15 mL  15  mL Mouth Rinse BID Consuella Lose, MD   15 mL at 05/07/18 1046  . folic acid injection 1 mg  1 mg Intravenous Daily Costella, Vincent J, PA-C   1 mg at 05/07/18 1314  . haloperidol lactate (HALDOL) injection 5 mg  5 mg Intravenous Q6H PRN Erline Levine, MD   5 mg at 05/06/18 0242  . hydrALAZINE (APRESOLINE) injection 5-20 mg  5-20 mg Intravenous Q4H PRN Costella, Vincent J, PA-C   20 mg at 05/06/18 1243  . insulin aspart (novoLOG) injection 0-15 Units  0-15 Units Subcutaneous TID WC Erline Levine, MD   2 Units at 05/07/18 1313  . insulin aspart (novoLOG) injection 0-9 Units  0-9 Units Subcutaneous Q4H PRN Costella, Vista Mink, PA-C      . levETIRAcetam (KEPPRA) IVPB 500 mg/100 mL premix  500  mg Intravenous Q12H Costella, Vista Mink, PA-C 400 mL/hr at 05/07/18 1313 500 mg at 05/07/18 1313  . lisinopril (ZESTRIL) tablet 20 mg  20 mg Oral QPM Costella, Vincent J, PA-C   20 mg at 05/05/18 1700  . loratadine (CLARITIN) tablet 10 mg  10 mg Oral Daily PRN Costella, Vista Mink, PA-C      . LORazepam (ATIVAN) injection 2-3 mg  2-3 mg Intravenous Q1H PRN Costella, Vincent J, PA-C   2 mg at 05/07/18 1314  . MEDLINE mouth rinse  15 mL Mouth Rinse q12n4p Consuella Lose, MD   15 mL at 05/06/18 1238  . nicotine (NICODERM CQ - dosed in mg/24 hours) patch 14 mg  14 mg Transdermal Daily Meyran, Ocie Cornfield, NP   14 mg at 05/07/18 1046  . ondansetron (ZOFRAN) tablet 4 mg  4 mg Oral Q6H PRN Costella, Vista Mink, PA-C       Or  . ondansetron (ZOFRAN) injection 4 mg  4 mg Intravenous Q6H PRN Costella, Vista Mink, PA-C   4 mg at 05/05/18 3557  . oxyCODONE (Oxy IR/ROXICODONE) immediate release tablet 5 mg  5 mg Oral Q4H PRN Costella, Vista Mink, PA-C      . pravastatin (PRAVACHOL) tablet 20 mg  20 mg Oral QHS Costella, Vincent J, PA-C   20 mg at 05/05/18 2148  . pregabalin (LYRICA) capsule 150 mg  150 mg Oral BID Traci Sermon, PA-C   150 mg at 05/07/18 1047  . promethazine (PHENERGAN) injection 12.5-25 mg  12.5-25 mg Intravenous Q6H PRN Traci Sermon, PA-C   25 mg at 05/04/18 1845  . sodium chloride flush (NS) 0.9 % injection 3 mL  3 mL Intravenous Q12H Costella, Vincent J, PA-C   3 mL at 05/07/18 1055  . sodium chloride flush (NS) 0.9 % injection 3 mL  3 mL Intravenous PRN Costella, Vista Mink, PA-C      . sodium phosphate (FLEET) 7-19 GM/118ML enema 1 enema  1 enema Rectal Once PRN Costella, Vista Mink, PA-C      . thiamine (B-1) injection 100 mg  100 mg Intravenous Daily Costella, Vincent J, PA-C   100 mg at 05/07/18 1047  . zolpidem (AMBIEN) tablet 5 mg  5 mg Oral QHS PRN Traci Sermon, PA-C         Discharge Medications: Please see discharge summary for a list of discharge  medications.  Relevant Imaging Results:  Relevant Lab Results:   Additional [place], LCSW

## 2018-05-08 ENCOUNTER — Inpatient Hospital Stay (HOSPITAL_COMMUNITY): Payer: Medicare Other

## 2018-05-08 DIAGNOSIS — F1010 Alcohol abuse, uncomplicated: Secondary | ICD-10-CM | POA: Diagnosis present

## 2018-05-08 DIAGNOSIS — R0603 Acute respiratory distress: Secondary | ICD-10-CM | POA: Diagnosis present

## 2018-05-08 DIAGNOSIS — S065X9A Traumatic subdural hemorrhage with loss of consciousness of unspecified duration, initial encounter: Principal | ICD-10-CM

## 2018-05-08 LAB — BASIC METABOLIC PANEL
Anion gap: 10 (ref 5–15)
BUN: 11 mg/dL (ref 8–23)
CO2: 26 mmol/L (ref 22–32)
Calcium: 7.9 mg/dL — ABNORMAL LOW (ref 8.9–10.3)
Chloride: 102 mmol/L (ref 98–111)
Creatinine, Ser: 0.75 mg/dL (ref 0.61–1.24)
GFR calc Af Amer: >60 mL/min (ref >60)
GFR calc non Af Amer: >60 mL/min (ref >60)
Glucose, Bld: 107 mg/dL — ABNORMAL HIGH (ref 70–99)
Potassium: 3.2 mmol/L — ABNORMAL LOW (ref 3.5–5.1)
Sodium: 138 mmol/L (ref 135–145)

## 2018-05-08 LAB — CBC WITH DIFFERENTIAL/PLATELET
Abs Immature Granulocytes: 0.04 10*3/uL (ref 0.00–0.07)
Basophils Absolute: 0 10*3/uL (ref 0.0–0.1)
Basophils Relative: 1 %
Eosinophils Absolute: 0 10*3/uL (ref 0.0–0.5)
Eosinophils Relative: 0 %
HCT: 36.3 % — ABNORMAL LOW (ref 39.0–52.0)
Hemoglobin: 13.1 g/dL (ref 13.0–17.0)
Immature Granulocytes: 1 %
Lymphocytes Relative: 12 %
Lymphs Abs: 0.4 10*3/uL — ABNORMAL LOW (ref 0.7–4.0)
MCH: 33.7 pg (ref 26.0–34.0)
MCHC: 36.1 g/dL — ABNORMAL HIGH (ref 30.0–36.0)
MCV: 93.3 fL (ref 80.0–100.0)
Monocytes Absolute: 0.5 10*3/uL (ref 0.1–1.0)
Monocytes Relative: 14 %
Neutro Abs: 2.4 10*3/uL (ref 1.7–7.7)
Neutrophils Relative %: 72 %
Platelets: 59 10*3/uL — ABNORMAL LOW (ref 150–400)
RBC: 3.89 MIL/uL — ABNORMAL LOW (ref 4.22–5.81)
RDW: 14 % (ref 11.5–15.5)
WBC: 3.3 10*3/uL — ABNORMAL LOW (ref 4.0–10.5)
nRBC: 0 % (ref 0.0–0.2)

## 2018-05-08 LAB — GLUCOSE, CAPILLARY
Glucose-Capillary: 65 mg/dL — ABNORMAL LOW (ref 70–99)
Glucose-Capillary: 94 mg/dL (ref 70–99)
Glucose-Capillary: 87 mg/dL (ref 70–99)
Glucose-Capillary: 88 mg/dL (ref 70–99)
Glucose-Capillary: 111 mg/dL — ABNORMAL HIGH (ref 70–99)
Glucose-Capillary: 70 mg/dL (ref 70–99)

## 2018-05-08 LAB — HEPATIC FUNCTION PANEL
ALT: 25 U/L (ref 0–44)
AST: 30 U/L (ref 15–41)
Albumin: 2.7 g/dL — ABNORMAL LOW (ref 3.5–5.0)
Alkaline Phosphatase: 144 U/L — ABNORMAL HIGH (ref 38–126)
Bilirubin, Direct: 1.4 mg/dL — ABNORMAL HIGH (ref 0.0–0.2)
Indirect Bilirubin: 2.2 mg/dL — ABNORMAL HIGH (ref 0.3–0.9)
Total Bilirubin: 3.6 mg/dL — ABNORMAL HIGH (ref 0.3–1.2)
Total Protein: 5.5 g/dL — ABNORMAL LOW (ref 6.5–8.1)

## 2018-05-08 LAB — D-DIMER, QUANTITATIVE: D-Dimer, Quant: 2.55 ug/mL-FEU — ABNORMAL HIGH (ref 0.00–0.50)

## 2018-05-08 LAB — SARS CORONAVIRUS 2 BY RT PCR (HOSPITAL ORDER, PERFORMED IN ~~LOC~~ HOSPITAL LAB): SARS Coronavirus 2: NEGATIVE

## 2018-05-08 MED ORDER — POTASSIUM CHLORIDE 10 MEQ/100ML IV SOLN
10.0000 meq | INTRAVENOUS | Status: AC
  Administered 2018-05-08 (×3): 10 meq via INTRAVENOUS
  Filled 2018-05-08 (×4): qty 100

## 2018-05-08 MED ORDER — LEVOFLOXACIN 500 MG PO TABS: 500.0000 mg | ORAL_TABLET | Freq: Every day | ORAL | Status: DC

## 2018-05-08 MED ORDER — POTASSIUM CHLORIDE 10 MEQ/50ML IV SOLN
10.0000 meq | INTRAVENOUS | Status: DC
  Administered 2018-05-08: 10 meq via INTRAVENOUS
  Filled 2018-05-08 (×4): qty 50

## 2018-05-08 MED ORDER — POTASSIUM CHLORIDE 10 MEQ/100ML IV SOLN
10.0000 meq | INTRAVENOUS | Status: DC
  Administered 2018-05-08: 10 meq via INTRAVENOUS
  Filled 2018-05-08: qty 100

## 2018-05-08 MED ORDER — FUROSEMIDE 10 MG/ML IJ SOLN
20.0000 mg | Freq: Once | INTRAMUSCULAR | Status: AC
  Administered 2018-05-08: 20 mg via INTRAVENOUS
  Filled 2018-05-08: qty 2

## 2018-05-08 MED ORDER — POTASSIUM CHLORIDE 10 MEQ/100ML IV SOLN: 10.0000 meq | INTRAVENOUS | Status: DC

## 2018-05-08 MED ORDER — DEXTROSE 5 % IV SOLN
INTRAVENOUS | Status: AC
  Administered 2018-05-08: via INTRAVENOUS

## 2018-05-08 MED ORDER — LEVOFLOXACIN IN D5W 500 MG/100ML IV SOLN
500.0000 mg | INTRAVENOUS | Status: DC
  Administered 2018-05-08 – 2018-05-09 (×2): 500 mg via INTRAVENOUS
  Filled 2018-05-08 (×3): qty 100

## 2018-05-08 MED ORDER — LORAZEPAM 2 MG/ML IJ SOLN
1.0000 mg | Freq: Four times a day (QID) | INTRAMUSCULAR | Status: DC
  Administered 2018-05-08 – 2018-05-10 (×7): 1 mg via INTRAVENOUS
  Filled 2018-05-08 (×7): qty 1

## 2018-05-08 MED ORDER — ALBUTEROL SULFATE (2.5 MG/3ML) 0.083% IN NEBU
2.5000 mg | INHALATION_SOLUTION | Freq: Once | RESPIRATORY_TRACT | Status: AC
  Administered 2018-05-08: 2.5 mg via RESPIRATORY_TRACT
  Filled 2018-05-08: qty 3

## 2018-05-08 MED ORDER — POTASSIUM CHLORIDE 10 MEQ/100ML IV SOLN
10.0000 meq | Freq: Once | INTRAVENOUS | Status: AC
  Administered 2018-05-08: 10 meq via INTRAVENOUS
  Filled 2018-05-08: qty 100

## 2018-05-08 MED ORDER — IOHEXOL 350 MG/ML SOLN
100.0000 mL | Freq: Once | INTRAVENOUS | Status: AC | PRN
  Administered 2018-05-08: 100 mL via INTRAVENOUS

## 2018-05-08 MED ORDER — SPIRONOLACTONE 25 MG PO TABS: 25.0000 mg | ORAL_TABLET | Freq: Every day | ORAL | Status: DC

## 2018-05-08 MED ORDER — DEXTROSE 50 % IV SOLN
INTRAVENOUS | Status: AC
  Administered 2018-05-08: 25 mL
  Filled 2018-05-08: qty 50

## 2018-05-08 NOTE — Progress Notes (Signed)
Subjective: Patient appears to be slightly tachypneic with some labored respirations. NTS overnight by the nurse. Will open eyes to voice, agitated in bed.   Objective: Vital signs in last 24 hours: Temp:  [97.8 F (36.6 C)-99.2 F (37.3 C)] 99.2 F (37.3 C) (05/03 0321) Pulse Rate:  [80-124] 90 (05/03 0321) Resp:  [17-35] 23 (05/03 0321) BP: (135-174)/(84-106) 174/84 (05/03 0321) SpO2:  [91 %-98 %] 96 % (05/03 0321)  Intake/Output from previous day: 05/02 0701 - 05/03 0700 In: 3 [I.V.:3] Out: 1000 [Urine:1000] Intake/Output this shift: No intake/output data recorded.   Lab Results: Lab Results  Component Value Date   WBC 1.7 (L) 05/04/2018   HGB 13.1 05/04/2018   HCT 37.6 (L) 05/04/2018   MCV 96.4 05/04/2018   PLT 35 (L) 05/04/2018   Lab Results  Component Value Date   INR 1.2 05/04/2018   BMET Lab Results  Component Value Date   NA 138 05/08/2018   K 3.2 (L) 05/08/2018   CL 102 05/08/2018   CO2 26 05/08/2018   GLUCOSE 107 (H) 05/08/2018   BUN 11 05/08/2018   CREATININE 0.75 05/08/2018   CALCIUM 7.9 (L) 05/08/2018    Studies/Results: Dg Chest Port 1 View  Result Date: 05/07/2018 CLINICAL DATA:  Tachycardia. EXAM: PORTABLE CHEST 1 VIEW COMPARISON:  May 04, 2018 FINDINGS: Stable cardiomegaly. The hila and mediastinum are unremarkable. Haziness over the right chest is at least partially due to patient rotation. However, mild asymmetric edema is not excluded. IMPRESSION: Haziness over the right chest is at least partially due to patient rotation. However, mild asymmetric edema not excluded. A PA and lateral chest x-ray with less rotation could better evaluate. Electronically Signed   By: Dorise Bullion III M.D   On: 05/07/2018 23:17   Dg Swallowing Func-speech Pathology  Result Date: 05/06/2018 Objective Swallowing Evaluation: Type of Study: MBS-Modified Barium Swallow Study  Patient Details Name: [name] MRN: [id number] Date of Birth: Oct 12, 1945 Today's  Date: 05/06/2018 Time: SLP Start Time (ACUTE ONLY): 1020 -SLP Stop Time (ACUTE ONLY): 1046 SLP Time Calculation (min) (ACUTE ONLY): 26 min Past Medical History: Past Medical History: Diagnosis Date . Anxiety  . Benign localized prostatic hyperplasia with lower urinary tract symptoms (LUTS)  . Depression  . Diverticulosis of colon  . GERD (gastroesophageal reflux disease)  . History of colitis  . History of colonic diverticulitis   03-10-2006 s/p bowel resection  . History of deep vein thrombosis (DVT) of lower extremity 06/2006  bilateral femoral/   04-08-2017 per pt had several dvt's in 2008 . Hypertension  . Hypertriglyceridemia  . NAFLD (nonalcoholic fatty liver disease)  . OA (osteoarthritis)   knees . Peripheral neuropathy  . Prostate cancer [facility]) urologist-- dr Alyson Ingles  oncologist- dr Tammi Klippel  dx 12-02-2016-- Stage T1c, Gleason 3+4,  PSA 8.2, 37.8cc-- scheduled for radioactive seed implants 04-16-2017 . S/P insertion of IVC (inferior vena caval) filter 07/02/2006  due to bilateral fermoral dvt . Seasonal allergies  . Thrombocytopenia ([place])  . Type 2 diabetes mellitus ([place])   followed by pcp . Weak urinary stream  . Wears glasses  Past Surgical History: Past Surgical History: Procedure Laterality Date . CATARACT EXTRACTION W/ INTRAOCULAR LENS  IMPLANT, BILATERAL  2010 . CHOLECYSTECTOMY OPEN  1973  and APPENDECTOMY . COLONOSCOPY N/A 06/01/2012  Procedure: COLONOSCOPY;  Surgeon: Rogene Houston, MD;  Location: [facility];  Service: Endoscopy;  Laterality: N/A;  930-moved to 1025 Ann to notify pt . IVC FILTER INSERTION  07/02/2006 . PROSTATE BIOPSY  12-02-2016   dr Alyson Ingles office . RADIOACTIVE SEED IMPLANT N/A 04/16/2017  Procedure: RADIOACTIVE SEED IMPLANT/BRACHYTHERAPY IMPLANT;  Surgeon: Cleon Gustin, MD;  Location: [facility];  Service: Urology;  Laterality: N/A; . SIGMOID RESECTION W/ STAPLED SIDE TO SIDE ANASTOMOSIS/ DRAINAGE ABSCESS  03-10-2006  dr bradford [facility] . SPACE OAR  INSTILLATION N/A 04/16/2017  Procedure: SPACE OAR INSTILLATION;  Surgeon: Cleon Gustin, MD;  Location: [facility];  Service: Urology;  Laterality: N/A; HPI: JHETT FRETWELL is a 73 y.o. male with history of diabetes, GERD, hyperlipidemia, hypertension, prostate cancer, cirrhosis who was brought to ED after a fall. He underwent CT head/C spine which is significant for an acute right SDH and C2 fracture. Repeat CT 4/30 revealed worsening findings: Some increased blooming of the right temporal intraparenchymal hematoma, measuring 5 x 3.3 x 2.2 cm today compared with 4.4 x 2.7 x 1.9 cm on the immediate previous film. Slight increase in size of the left frontoparietal vertex contusions, increased by a mm or 2, measuring 7 and 10 mm presently. CXR No acute cardiopulmonary disease.  Pt underwent clinical swallow eval with recommendation for MBS and npo x ice chips/medicine with applesauce. RN reports pt is coughing with applesauce and is concerned for aspiration.  MBS indicated. Pt has not had surgery and is in a cervical collar.   Subjective: pt in chair, had been fully awake with pt/ot at edge of bed 15 minutes prior Assessment / Plan / Recommendation CHL IP CLINICAL IMPRESSIONS 05/06/2018 Clinical Impression Patient's mentation negatively impacted the accuracy of the study.  He was fully alert 20 minutes prior sitting up in his bed with PT/OT however was sleepy during testing.  SlP proceeded with test as pt had been insisting on consuming water.  He currently presents with moderate oropharyngeal dysphagia due to sensorimotor deficits.  Decreased lingual coordination results in premature spillage with all boluses and delayed oral transiting/lingual pumping with puree.  He aspirated with thin prior to swallow as barium pooled at pyriform sinus and spilled into open airway.  Aspiration followed by weak nonproductive cough.  Pt with only mild resdiuals but residuals mix with secretions and are often  penetrated and subsequently silented aspirated.  Verbal cues and dry spoon lingual stimulation did not elicit dry swallow, trace barium on spoon effective for swallow.  At this time due to pt's mentation, recommend he remain npo with oral care.  When he fully awakens would recommend to start a dys1/nectar diet with occasional oral suctioning before, during and after intake.  Recommend pt be allowed tsps of thin water between meals only after oral care/suction for his comfort, no other intake with the water.  Will follow up for dysphagia management     MD please order cognitive linguistic evaluation if you agree.  Thanks.  SLP Visit Diagnosis Dysphagia, oropharyngeal phase (R13.12) Attention and concentration deficit following -- Frontal lobe and executive function deficit following -- Impact on safety and function Moderate aspiration risk;Risk for inadequate nutrition/hydration   CHL IP TREATMENT RECOMMENDATION 05/06/2018 Treatment Recommendations Therapy as outlined in treatment plan below   Prognosis 05/05/2018 Prognosis for Safe Diet Advancement Good Barriers to Reach Goals Medication Barriers/Prognosis Comment -- CHL IP DIET RECOMMENDATION 05/06/2018 SLP Diet Recommendations Dysphagia 1 (Puree) solids;Nectar thick liquid;Free water protocol after oral care Liquid Administration via Cup Medication Administration Whole meds with puree Compensations Slow rate;Small sips/bites;Other (Comment) Postural Changes Seated upright at 90 degrees;Remain semi-upright after after feeds/meals (  Comment)   CHL IP OTHER RECOMMENDATIONS 05/06/2018 Recommended Consults -- Oral Care Recommendations Oral care before and after PO;Other (Comment) Other Recommendations Order thickener from pharmacy;Have oral suction available   CHL IP FOLLOW UP RECOMMENDATIONS 05/06/2018 Follow up Recommendations Other (comment)   CHL IP FREQUENCY AND DURATION 05/06/2018 Speech Therapy Frequency (ACUTE ONLY) min 2x/week Treatment Duration 2 weeks      CHL IP ORAL  PHASE 05/06/2018 Oral Phase Impaired Oral - Pudding Teaspoon -- Oral - Pudding Cup -- Oral - Honey Teaspoon -- Oral - Honey Cup -- Oral - Nectar Teaspoon Decreased bolus cohesion;Weak lingual manipulation;Right anterior bolus loss Oral - Nectar Cup Decreased bolus cohesion;Premature spillage;Weak lingual manipulation Oral - Nectar Straw Premature spillage;Decreased bolus cohesion;Weak lingual manipulation;Lingual pumping Oral - Thin Teaspoon -- Oral - Thin Cup Decreased bolus cohesion;Premature spillage;Weak lingual manipulation;Right anterior bolus loss Oral - Thin Straw Decreased bolus cohesion;Premature spillage;Weak lingual manipulation Oral - Puree Weak lingual manipulation;Delayed oral transit;Premature spillage;Lingual pumping Oral - Mech Soft Impaired mastication;Delayed oral transit;Weak lingual manipulation;Premature spillage;Lingual pumping Oral - Regular -- Oral - Multi-Consistency -- Oral - Pill -- Oral Phase - Comment --  CHL IP PHARYNGEAL PHASE 05/06/2018 Pharyngeal Phase Impaired Pharyngeal- Pudding Teaspoon -- Pharyngeal -- Pharyngeal- Pudding Cup -- Pharyngeal -- Pharyngeal- Honey Teaspoon -- Pharyngeal -- Pharyngeal- Honey Cup -- Pharyngeal -- Pharyngeal- Nectar Teaspoon Delayed swallow initiation-vallecula Pharyngeal -- Pharyngeal- Nectar Cup Delayed swallow initiation-vallecula Pharyngeal -- Pharyngeal- Nectar Straw Delayed swallow initiation-vallecula;Penetration/Aspiration during swallow Pharyngeal Material enters airway, remains ABOVE vocal cords and not ejected out Pharyngeal- Thin Teaspoon Delayed swallow initiation-pyriform sinuses;Penetration/Aspiration before swallow Pharyngeal Material enters airway, passes BELOW cords without attempt by patient to eject out (silent aspiration) Pharyngeal- Thin Cup Delayed swallow initiation-pyriform sinuses;Penetration/Aspiration before swallow;Penetration/Aspiration during swallow Pharyngeal Material enters airway, passes BELOW cords and not ejected out  despite cough attempt by patient Pharyngeal- Thin Straw -- Pharyngeal -- Pharyngeal- Puree WFL Pharyngeal -- Pharyngeal- Mechanical Soft WFL Pharyngeal -- Pharyngeal- Regular -- Pharyngeal -- Pharyngeal- Multi-consistency -- Pharyngeal -- Pharyngeal- Pill -- Pharyngeal -- Pharyngeal Comment --  No flowsheet data found. Macario Golds 05/06/2018, 11:17 AM               Assessment/Plan: 73 year old right SDH, temp contustions and C2 fx after sustaining a fall. Patient seems to be having some labored respirations at times. Chest xray last night showed possible pneumonia. BMet stable. Will order a PA/lat and lasix today. consult medicine to help manage.   LOS: 4 days    [place] 05/08/2018, 7:55 AM

## 2018-05-08 NOTE — Progress Notes (Signed)
Advised [name] that Potassium 25mEq/50mL given prior to order being changed.

## 2018-05-08 NOTE — Consult Note (Signed)
Medical Consultation   [name]  YQI:[id number]  DOB: [birth year]  DOA: 05/04/2018  PCP: Celene Squibb, MD   Outpatient Specialists: Tammi Klippel - rad onc; Higgs - oncology; McKenzie - urology  Requesting physician: [facility] - neurosurgery  Reason for consultation: Traumatic fall with SDH.  Since last night, has had tachypnea, SOB.  CXR ordered - ?PNA.  Given Lasix.   History of Present Illness: [name] is an 73 y.o. male DM; metastatic prostate CA; NASH with cirrhosis; HTN admitted on 4/29 with traumatic SDH with C2 fracture.  He has had confusion and agitation since admission and has been treated with Ativan and Haldol.  He was awaiting SNF placement when he developed tachypnea and increased WOB.  Portable CXR was performed last night but was an inadequate study.  PA/lateral CXR this AM is pending.  Patient has not been COVID tested during the hospitalization.  He is delirious and unable to answer questions.  I called and spoke with the niece.  She thinks he drinks "more than he should."  He drinks "plenty" and smokes 1-2 packs per day.  He does not have a h/o ETOH withdrawal, to her knowledge.  He moved at the end of March and has been here about 3 weeks; in the last 3-4 weeks, he has been happier in his new apartment but generally suffers from pretty severe depression.  He did fall while moving and laid there for 3 hours because he was unable to call anyone.  He loses his train of thought mid-sentence and can't get it back, very unsteady on his feet, tells her multiple things days in a row - these things happen any time of day.  She does not think he has been arrested for DWI.  Alcoholism runs in the family, a paternal uncle was an alcoholic.  She reports that he "has been extremely depressed and bitter."  He made some bad financial decisions, currently living in [address].  He has been successful in his past life but has hit hard times.  His brother was on  a vent in the past and the patient said he would not desire this.  He prefers to be DNR.  His niece cameraed in yesterday and saw him - he is unkempt, left hip and knee bruising and abrasions, dentures out - and so she is aware of his condition and reports that he is always very well dressed and would be mortified for anyone to see him like this.  He shakes a lot at baseline - ?ETOH.  He stays up late at night and sleeps late in the day.  He has a couple of close friends from high school and otherwise no close family or friends.   Review of Systems:  ROS Unable to perform   Past Medical History: Past Medical History:  Diagnosis Date  . Anxiety   . Benign localized prostatic hyperplasia with lower urinary tract symptoms (LUTS)   . Depression   . Diverticulosis of colon   . GERD (gastroesophageal reflux disease)   . History of colitis   . History of colonic diverticulitis    03-10-2006 s/p bowel resection   . 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 Hypertension   . Hypertriglyceridemia   . NAFLD (nonalcoholic fatty liver disease)   .  OA (osteoarthritis)    knees  . Peripheral neuropathy   . Prostate cancer [facility]) urologist-- dr Alyson Ingles  oncologist- dr Tammi Klippel   dx 12-02-2016-- Stage T1c, Gleason 3+4,  PSA 8.2, 37.8cc-- scheduled for radioactive seed implants 04-16-2017  . S/P insertion of IVC (inferior vena caval) filter 07/02/2006   due to bilateral fermoral dvt  . Seasonal allergies   . Thrombocytopenia ([place])   . Type 2 diabetes mellitus ([place])    followed by pcp  . Weak urinary stream   . Wears glasses     Past Surgical History: Past Surgical History:  Procedure Laterality Date  . CATARACT EXTRACTION W/ INTRAOCULAR LENS  IMPLANT, BILATERAL  2010  . CHOLECYSTECTOMY OPEN  1973   and APPENDECTOMY  . COLONOSCOPY N/A 06/01/2012   Procedure: COLONOSCOPY;  Surgeon: Rogene Houston,  MD;  Location: [facility];  Service: Endoscopy;  Laterality: N/A;  930-moved to [address] to notify pt  . IVC FILTER INSERTION  07/02/2006  . PROSTATE BIOPSY  12-02-2016   dr Alyson Ingles office  . RADIOACTIVE SEED IMPLANT N/A 04/16/2017   Procedure: RADIOACTIVE SEED IMPLANT/BRACHYTHERAPY IMPLANT;  Surgeon: Cleon Gustin, MD;  Location: [facility];  Service: Urology;  Laterality: N/A;  . SIGMOID RESECTION W/ STAPLED SIDE TO SIDE ANASTOMOSIS/ DRAINAGE ABSCESS  03-10-2006  dr bradford [facility]  . SPACE OAR INSTILLATION N/A 04/16/2017   Procedure: SPACE OAR INSTILLATION;  Surgeon: Cleon Gustin, MD;  Location: [facility];  Service: Urology;  Laterality: N/A;     Allergies:   Allergies  Allergen Reactions  . Metronidazole Nausea And Vomiting    REACTION: causes nausea and vomiting     Social History:  reports that he has been smoking cigarettes. He has a 27.00 pack-year smoking history. He has never used smokeless tobacco. He reports current alcohol use. He reports that he does not use drugs.   Family History: Family History  Problem Relation Age of Onset  . Cancer Maternal Uncle        colon      Physical Exam: Vitals:   05/08/18 0321 05/08/18 0817 05/08/18 0848 05/08/18 0900  BP: (!) 174/84 (!) 160/87    Pulse: 90     Resp: (!) 23     Temp: 99.2 F (37.3 C) 98.7 F (37.1 C)    TempSrc: Axillary Oral    SpO2: 96%  96%   Weight:    88.2 kg    Constitutional: Alert but clearly agitated and delirious, minimal tachypnea Eyes:  EOMI, irises appear normal, anicteric sclera,  ENMT: external ears and nose appear normal,  Lips appear normal, oropharynx mucosa, tongue appear normal  Neck: C-collar in place CVS: S1-S2 clear, no murmur rubs or gallops, no LE edema, normal pedal pulses  Respiratory:  clear to auscultation bilaterally, no wheezing, rales or rhonchi. Respiratory effort normal. No accessory muscle use.  Abdomen: soft nontender,  nondistended, normal bowel sounds, no hepatosplenomegaly, no hernias  Musculoskeletal: : no cyanosis, clubbing or edema noted bilaterally Neuro: unable to perform Psych: delirious; responds to voice, touch but unable to form sensical speech patterns Skin: scattered excoriations, abrasions, and ecchymoses - particularly along his left lateral leg    Data reviewed:  I have personally reviewed the recent labs and imaging studies  Pertinent Labs:  Today - K+ 3.2 Glucose 107 WBC 3.3 Hgb 13.1 Platelets 59 D-dimer 2.55  4/29 - Glucose 112 WBC 1.7 Platelets 35 ETOH 212  Inpatient Medications:  Scheduled Meds: . sodium chloride   Intravenous Once  . chlorhexidine  15 mL Mouth Rinse BID  . folic acid  1 mg Intravenous Daily  . insulin aspart  0-15 Units Subcutaneous TID WC  . lisinopril  20 mg Oral QPM  . mouth rinse  15 mL Mouth Rinse q12n4p  . nicotine  14 mg Transdermal Daily  . pravastatin  20 mg Oral QHS  . pregabalin  150 mg Oral BID  . sodium chloride flush  3 mL Intravenous Q12H  . thiamine injection  100 mg Intravenous Daily   Continuous Infusions: . sodium chloride    . sodium chloride 100 mL/hr at 05/06/18 0141  . levETIRAcetam Stopped (05/07/18 2342)  . levofloxacin (LEVAQUIN) IV 500 mg (05/08/18 1011)  . potassium chloride 10 mEq (05/08/18 1121)     Radiological Exams on Admission: Dg Chest Port 1 View  Result Date: 05/07/2018 CLINICAL DATA:  Tachycardia. EXAM: PORTABLE CHEST 1 VIEW COMPARISON:  May 04, 2018 FINDINGS: Stable cardiomegaly. The hila and mediastinum are unremarkable. Haziness over the right chest is at least partially due to patient rotation. However, mild asymmetric edema is not excluded. IMPRESSION: Haziness over the right chest is at least partially due to patient rotation. However, mild asymmetric edema not excluded. A PA and lateral chest x-ray with less rotation could better evaluate. Electronically Signed   By: Dorise Bullion III M.D   On:  05/07/2018 23:17    Impression/Recommendations Principal Problem:   Subdural hematoma ([place]) Active Problems:   Diabetes mellitus type 2 in nonobese [facility])   Essential hypertension   Hepatic cirrhosis ([facility])   Prostate cancer ([place])   Respiratory distress   Alcohol abuse  Subdural hematoma -Patient with traumatic subdural with C2 fracture -Admitted by neurosurgery -Awaiting SNF placement  Respiratory distress -Patient developed respiratory distress overnight -2-view CXR pending but canceled - patient is unable to cooperate enough to stand -Given Lasix by neurosurgery -COVID testing negative -D-dimer significantly elevated; will check CTA for further evaluation of PNA, PE, etc -CTA negative for PE, ?mild CHF; he does not have known h/o CHF -Given concern for alternate cause (ETOH W/D) at this time and mild respiratory symptoms, will hold on echo or additional evaluation at this time. -However, if he appears to have increased WOB/hypoxia, Lasix would be the best immediate option based on current information. -He is NPO and so continues to need IVF; will decrease from 100 cc/hr to 50 cc/hr for now.  ETOH abuse with withdrawal -ETOH level markedly elevated on admission (212) -Patient with apparent DTs since admission with confusion, delirium -On CIWA -Strongly suspect DTs as the cause for his confusion/delirium based on historical factors, discussion with his niece -Consider transfer back to ICU with PCCM on Precedex if not improving -For now, add standing 1 mg Ativan q6h in addition to CIWA -Change diet order from dysphagia 1 to NPO given delirium and inability to successfully protect his airway with PO intake  Cirrhosis -NASH vs. Alcoholic liver disease - suspect the latter -Has chronic thrombocytopenia associated with liver disease -MELD/MELD-Na score is 13/14, with a mortality rate of 6% -Will add Aldactone 25 mg PO daily.  DM -Holding Metformin -Moderate-scale SSI  HTN  -On Lisinopril  Prostate CA -s/p seed implants -Recent PSA reportedly normal      Thank you for this consultation.  Our [facility] hospitalist team will follow the patient with you.   Time Spent: 75 minutes  Karmen Bongo M.D. [facility] 05/08/2018, 11:56 AM

## 2018-05-08 NOTE — Progress Notes (Signed)
2308: Pt's CBG 70. Only NS running and pt is NPO. Paged on-call provider. Received order for D5 @ 50 mL. Will continue to monitor.

## 2018-05-09 ENCOUNTER — Other Ambulatory Visit: Payer: Self-pay

## 2018-05-09 ENCOUNTER — Inpatient Hospital Stay (HOSPITAL_COMMUNITY): Payer: Medicare Other

## 2018-05-09 DIAGNOSIS — E119 Type 2 diabetes mellitus without complications: Secondary | ICD-10-CM | POA: Diagnosis not present

## 2018-05-09 DIAGNOSIS — J449 Chronic obstructive pulmonary disease, unspecified: Secondary | ICD-10-CM | POA: Diagnosis not present

## 2018-05-09 DIAGNOSIS — I10 Essential (primary) hypertension: Secondary | ICD-10-CM | POA: Diagnosis not present

## 2018-05-09 DIAGNOSIS — E782 Mixed hyperlipidemia: Secondary | ICD-10-CM | POA: Diagnosis not present

## 2018-05-09 LAB — GLUCOSE, CAPILLARY
Glucose-Capillary: 85 mg/dL (ref 70–99)
Glucose-Capillary: 97 mg/dL (ref 70–99)
Glucose-Capillary: 98 mg/dL (ref 70–99)
Glucose-Capillary: 91 mg/dL (ref 70–99)
Glucose-Capillary: 94 mg/dL (ref 70–99)

## 2018-05-09 LAB — COMPREHENSIVE METABOLIC PANEL
ALT: 25 U/L (ref 0–44)
AST: 28 U/L (ref 15–41)
Albumin: 2.7 g/dL — ABNORMAL LOW (ref 3.5–5.0)
Alkaline Phosphatase: 145 U/L — ABNORMAL HIGH (ref 38–126)
Anion gap: 9 (ref 5–15)
BUN: 9 mg/dL (ref 8–23)
CO2: 28 mmol/L (ref 22–32)
Calcium: 8 mg/dL — ABNORMAL LOW (ref 8.9–10.3)
Chloride: 98 mmol/L (ref 98–111)
Creatinine, Ser: 0.78 mg/dL (ref 0.61–1.24)
GFR calc Af Amer: >60 mL/min (ref >60)
GFR calc non Af Amer: >60 mL/min (ref >60)
Glucose, Bld: 103 mg/dL — ABNORMAL HIGH (ref 70–99)
Potassium: 3.2 mmol/L — ABNORMAL LOW (ref 3.5–5.1)
Sodium: 135 mmol/L (ref 135–145)
Total Bilirubin: 4.1 mg/dL — ABNORMAL HIGH (ref 0.3–1.2)
Total Protein: 5.3 g/dL — ABNORMAL LOW (ref 6.5–8.1)

## 2018-05-09 LAB — CBC
HCT: 35.5 % — ABNORMAL LOW (ref 39.0–52.0)
Hemoglobin: 13.1 g/dL (ref 13.0–17.0)
MCH: 33.7 pg (ref 26.0–34.0)
MCHC: 36.9 g/dL — ABNORMAL HIGH (ref 30.0–36.0)
MCV: 91.3 fL (ref 80.0–100.0)
Platelets: 53 10*3/uL — ABNORMAL LOW (ref 150–400)
RBC: 3.89 MIL/uL — ABNORMAL LOW (ref 4.22–5.81)
RDW: 13.6 % (ref 11.5–15.5)
WBC: 2.8 10*3/uL — ABNORMAL LOW (ref 4.0–10.5)
nRBC: 0 % (ref 0.0–0.2)

## 2018-05-09 MED ORDER — FUROSEMIDE 10 MG/ML IJ SOLN
40.0000 mg | Freq: Once | INTRAMUSCULAR | Status: AC
  Administered 2018-05-09: 40 mg via INTRAVENOUS
  Filled 2018-05-09: qty 4

## 2018-05-09 MED ORDER — POTASSIUM CHLORIDE 2 MEQ/ML IV SOLN
INTRAVENOUS | Status: DC
  Administered 2018-05-09: 13:00:00 via INTRAVENOUS
  Filled 2018-05-09 (×2): qty 1000

## 2018-05-09 MED ORDER — HYDRALAZINE HCL 20 MG/ML IJ SOLN
10.0000 mg | INTRAMUSCULAR | Status: DC | PRN
  Administered 2018-05-11 – 2018-05-18 (×6): 10 mg via INTRAVENOUS
  Filled 2018-05-09 (×6): qty 1

## 2018-05-09 MED ORDER — PREGABALIN 100 MG PO CAPS: 100.0000 mg | ORAL_CAPSULE | Freq: Every day | ORAL | Status: DC

## 2018-05-09 MED ORDER — LORAZEPAM 2 MG/ML IJ SOLN
1.0000 mg | INTRAMUSCULAR | Status: DC | PRN
  Administered 2018-05-09 – 2018-05-10 (×2): 2 mg via INTRAVENOUS
  Filled 2018-05-09 (×2): qty 1

## 2018-05-09 NOTE — Progress Notes (Signed)
Patient got right leg stuck in side rail of bed.  Small bruise noted on knee, will monitor.

## 2018-05-09 NOTE — Progress Notes (Addendum)
PROGRESS NOTE    [name]  JOI:[id number] DOB: 1945/06/18 DOA: 05/04/2018 PCP: Celene Squibb, MD  Brief Narrative: Mr. Kober is a 73 year old male with history of metastatic prostate cancer, Liver cirrhosis, diabetes mellitus, hypertension, alcohol abuse, tobacco abuse was admitted to neurosurgery on 4/29 with right intraparenchymal hematoma, subdural hematoma and a C2 fracture. -He had been awaiting SNF placement, through his hospitalization he was noted to have increased confusion and agitation as well as respiratory distress. -TRH was consulted 5/3 due to increased agitation and respiratory distress -Was felt to have alcohol withdrawal, started on Ativan -Also noted to have hypoglycemia, pulmonary edema and pleural effusions  Assessment & Plan:   Right intraparenchymal hematoma, extensive right subdural hematoma and possible intraventricular hemorrhage with traumatic C2 fracture -Following a fall -INR 1.2, has chronic thrombocytopenia from liver cirrhosis -Admitted by neurosurgery, currently in C collar confused -managed non operatively -Last CT head from 4/30 was concerning due to worsening mental status will repeat CT head  Alcohol abuse with withdrawal -Alcohol level was markedly elevated on admission at 212 -On day 2 of hospitalization patient was noted to have increased agitation and confusion consistent with timeline for acute withdrawal -Continue scheduled Ativan and PRN for withdrawal -Thiamine daily -Also repeat CT head as above, with worsening mental status  Fluid overload -Likely secondary to liver cirrhosis with hypoalbuminemia and third spacing -Chest x-ray and CTA chest is consistent with pulmonary edema and pleural effusions -COVID PCR is negative -Cutdown IV fluids, currently only getting dextrose for hypoglycemia -IV Lasix x2 doses today  Liver Cirrhosis -Suspect alcoholic liver disease given long history of alcoholism -Chronic thrombocytopenia  associated with liver disease -Currently volume overloaded IV Lasix as noted above  DM -Holding Metformin -Moderate-scale SSI  HTN -On Lisinopril  Prostate CA -s/p seed implants -Recent PSA reportedly normal  DVT prophylaxis: SCDs Code Status: Full code Family Communication: No family at bedside, called and d/w Niece [name] Disposition Plan: To be determined high risk of complications    Antimicrobials:    Subjective: -Agitated earlier this morning, got Haldol over an hour ago, now fast asleep  Objective: Vitals:   05/09/18 0032 05/09/18 0447 05/09/18 0723 05/09/18 1321  BP: (!) 175/94 (!) 141/87 (!) 172/160 (!) 163/68  Pulse: 78 85 84 (!) 58  Resp:  (!) 22    Temp: 98 F (36.7 C) 97.6 F (36.4 C) (!) 97.3 F (36.3 C) 98.4 F (36.9 C)  TempSrc: Oral Oral Oral Oral  SpO2: 98% 99% 97%   Weight:        Intake/Output Summary (Last 24 hours) at 05/09/2018 1346 Last data filed at 05/09/2018 1100 Gross per 24 hour  Intake 619.3 ml  Output 2500 ml  Net -1880.7 ml   Filed Weights   05/08/18 0900  Weight: 88.2 kg    Examination:  General exam: Somnolent, moans and withdraws to painful stimuli, Respiratory system: Few basilar crackles, rest clear Cardiovascular system: S1 & S2 heard, RRR. Gastrointestinal system: Abdomen is nondistended, soft and nontender.Normal bowel sounds heard. Central nervous system: Obtunded, sedated from medications, limited CNS exam, pupils are enlarged, reactive Extremities: No edema Skin: No rashes, lesions or ulcers Psychiatry: Unable to assess   Data Reviewed:   CBC: Recent Labs  Lab 05/04/18 0300 05/08/18 1237 05/09/18 0758  WBC 1.7* 3.3* 2.8*  NEUTROABS 0.9* 2.4  --   HGB 13.1 13.1 13.1  HCT 37.6* 36.3* 35.5*  MCV 96.4 93.3 91.3  PLT 35* 59* 53*  Basic Metabolic Panel: Recent Labs  Lab 05/04/18 0300 05/08/18 0629 05/09/18 0758  NA 136 138 135  K 3.7 3.2* 3.2*  CL 101 102 98  CO2 24 26 28   GLUCOSE 112*  107* 103*  BUN 6* 11 9  CREATININE 0.94 0.75 0.78  CALCIUM 8.3* 7.9* 8.0*   GFR: Estimated Creatinine Clearance: 91.7 mL/min (by C-G formula based on SCr of 0.78 mg/dL). Liver Function Tests: Recent Labs  Lab 05/08/18 1237 05/09/18 0758  AST 30 28  ALT 25 25  ALKPHOS 144* 145*  BILITOT 3.6* 4.1*  PROT 5.5* 5.3*  ALBUMIN 2.7* 2.7*   No results for input(s): LIPASE, AMYLASE in the last 168 hours. No results for input(s): AMMONIA in the last 168 hours. Coagulation Profile: Recent Labs  Lab 05/04/18 0300  INR 1.2   Cardiac Enzymes: No results for input(s): CKTOTAL, CKMB, CKMBINDEX, TROPONINI in the last 168 hours. BNP (last 3 results) No results for input(s): PROBNP in the last 8760 hours. HbA1C: No results for input(s): HGBA1C in the last 72 hours. CBG: Recent Labs  Lab 05/08/18 1601 05/08/18 2306 05/09/18 0604 05/09/18 0717 05/09/18 1215  GLUCAP 111* 70 85 97 98   Lipid Profile: No results for input(s): CHOL, HDL, LDLCALC, TRIG, CHOLHDL, LDLDIRECT in the last 72 hours. Thyroid Function Tests: No results for input(s): TSH, T4TOTAL, FREET4, T3FREE, THYROIDAB in the last 72 hours. Anemia Panel: No results for input(s): VITAMINB12, FOLATE, FERRITIN, TIBC, IRON, RETICCTPCT in the last 72 hours. Urine analysis:    Component Value Date/Time   COLORURINE AMBER (A) 04/30/2017 1815   APPEARANCEUR CLOUDY (A) 04/30/2017 1815   LABSPEC 1.024 04/30/2017 1815   PHURINE 5.0 04/30/2017 1815   GLUCOSEU >=500 (A) 04/30/2017 1815   HGBUR LARGE (A) 04/30/2017 1815   BILIRUBINUR NEGATIVE 04/30/2017 1815   KETONESUR NEGATIVE 04/30/2017 1815   PROTEINUR 100 (A) 04/30/2017 1815   UROBILINOGEN 0.2 03/28/2010 1427   NITRITE NEGATIVE 04/30/2017 1815   LEUKOCYTESUR LARGE (A) 04/30/2017 1815   Sepsis Labs: @LABRCNTIP (procalcitonin:4,lacticidven:4)  ) Recent Results (from the past 240 hour(s))  MRSA PCR Screening     Status: None   Collection Time: 05/04/18  6:37 AM  Result  Value Ref Range Status   MRSA by PCR NEGATIVE NEGATIVE Final    Comment:        The GeneXpert MRSA Assay (FDA approved for NASAL specimens only), is one component of a comprehensive MRSA colonization surveillance program. It is not intended to diagnose MRSA infection nor to guide or monitor treatment for MRSA infections. Performed at [facility], [place] [address], [place], [place] [address]   SARS Coronavirus 2 (CEPHEID- Performed in [place] hospital lab), Hosp Order     Status: None   Collection Time: 05/08/18 11:30 AM  Result Value Ref Range Status   SARS Coronavirus 2 NEGATIVE NEGATIVE Final    Comment: (NOTE) If result is NEGATIVE SARS-CoV-2 target nucleic acids are NOT DETECTED. The SARS-CoV-2 RNA is generally detectable in upper and lower  respiratory specimens during the acute phase of infection. The lowest  concentration of SARS-CoV-2 viral copies this assay can detect is 250  copies / mL. A negative result does not preclude SARS-CoV-2 infection  and should not be used as the sole basis for treatment or other  patient management decisions.  A negative result may occur with  improper specimen collection / handling, submission of specimen other  than nasopharyngeal swab, presence of viral mutation(s) within the  areas  targeted by this assay, and inadequate number of viral copies  (<250 copies / mL). A negative result must be combined with clinical  observations, patient history, and epidemiological information. If result is POSITIVE SARS-CoV-2 target nucleic acids are DETECTED. The SARS-CoV-2 RNA is generally detectable in upper and lower  respiratory specimens dur ing the acute phase of infection.  Positive  results are indicative of active infection with SARS-CoV-2.  Clinical  correlation with patient history and other diagnostic information is  necessary to determine patient infection status.  Positive results do  not rule out bacterial infection or  co-infection with other viruses. If result is PRESUMPTIVE POSTIVE SARS-CoV-2 nucleic acids MAY BE PRESENT.   A presumptive positive result was obtained on the submitted specimen  and confirmed on repeat testing.  While 2019 novel coronavirus  (SARS-CoV-2) nucleic acids may be present in the submitted sample  additional confirmatory testing may be necessary for epidemiological  and / or clinical management purposes  to differentiate between  SARS-CoV-2 and other Sarbecovirus currently known to infect humans.  If clinically indicated additional testing with an alternate test  methodology [phone]) is advised. The SARS-CoV-2 RNA is generally  detectable in upper and lower respiratory sp ecimens during the acute  phase of infection. The expected result is Negative. Fact Sheet for Patients:  StrictlyIdeas.no Fact Sheet for Healthcare Providers: BankingDealers.co.za This test is not yet approved or cleared by the [place] FDA and has been authorized for detection and/or diagnosis of SARS-CoV-2 by FDA under an Emergency Use Authorization (EUA).  This EUA will remain in effect (meaning this test can be used) for the duration of the COVID-19 declaration under Section 564(b)(1) of the Act, 21 U.S.C. section 360bbb-3(b)(1), unless the authorization is terminated or revoked sooner. Performed at [facility], [place] [address] [place], [place] [address]          Radiology Studies: Ct Angio Chest Pe W Or Wo Contrast  Result Date: 05/08/2018 CLINICAL DATA:  Tachypnea, labored breathing. EXAM: CT ANGIOGRAPHY CHEST WITH CONTRAST TECHNIQUE: Multidetector CT imaging of the chest was performed using the standard protocol during bolus administration of intravenous contrast. Multiplanar CT image reconstructions and MIPs were obtained to evaluate the vascular anatomy. CONTRAST:  138mL OMNIPAQUE IOHEXOL 350 MG/ML SOLN COMPARISON:  Chest x-ray  05/07/2018 FINDINGS: Cardiovascular: Satisfactory opacification of the pulmonary arteries to the segmental level. No evidence of pulmonary embolism. Stable enlarged heart size. No pericardial effusion. Thoracic aortic atherosclerosis. No thoracic aortic dissection. Coronary artery atherosclerosis. Mediastinum/Nodes: No enlarged mediastinal, hilar, or axillary lymph nodes. Thyroid gland, trachea, and esophagus demonstrate no significant findings. Lungs/Pleura: Bilateral interstitial thickening. Patchy areas of airspace disease with ground-glass opacity in the right upper lobe. small bilateral pleural effusions. Bibasilar atelectasis. Upper Abdomen: No acute abnormality. Musculoskeletal: No chest wall abnormality. No acute or significant osseous findings. Review of the MIP images confirms the above findings. IMPRESSION: 1. No evidence of pulmonary embolus. 2. Findings concerning for mild CHF. 3. Patchy areas of airspace disease with ground-glass opacity in the right upper lobe which may reflect more severe asymmetric pulmonary edema versus less likely secondary to an infectious or inflammatory etiology. 4.  Aortic Atherosclerosis (ICD10-I70.0). Electronically Signed   By: Kathreen Devoid   On: 05/08/2018 14:53   Dg Chest Port 1 View  Result Date: 05/07/2018 CLINICAL DATA:  Tachycardia. EXAM: PORTABLE CHEST 1 VIEW COMPARISON:  May 04, 2018 FINDINGS: Stable cardiomegaly. The hila and mediastinum are unremarkable. Haziness over the right chest is  at least partially due to patient rotation. However, mild asymmetric edema is not excluded. IMPRESSION: Haziness over the right chest is at least partially due to patient rotation. However, mild asymmetric edema not excluded. A PA and lateral chest x-ray with less rotation could better evaluate. Electronically Signed   By: Dorise Bullion III M.D   On: 05/07/2018 23:17        Scheduled Meds: . sodium chloride   Intravenous Once  . chlorhexidine  15 mL Mouth Rinse BID   . folic acid  1 mg Intravenous Daily  . furosemide  40 mg Intravenous Once  . insulin aspart  0-15 Units Subcutaneous TID WC  . LORazepam  1 mg Intravenous Q6H  . mouth rinse  15 mL Mouth Rinse q12n4p  . nicotine  14 mg Transdermal Daily  . pravastatin  20 mg Oral QHS  . pregabalin  100 mg Oral QHS  . thiamine injection  100 mg Intravenous Daily   Continuous Infusions: . dextrose 5 % with kcl 50 mL/hr at 05/09/18 1245  . levETIRAcetam 500 mg (05/09/18 1150)  . levofloxacin (LEVAQUIN) IV Stopped (05/09/18 0924)     LOS: 5 days    Time spent: 4min    Domenic Polite, MD [facility]  05/09/2018, 1:46 PM

## 2018-05-09 NOTE — Progress Notes (Addendum)
Occupational Therapy Treatment Patient Details Name: [name] MRN: [id number] DOB: 27-May-1945 Today's Date: 05/09/2018    History of present illness [name] is a 73 y.o. male With history of diabetes, hyperlipidemia, hypertension, prostate cancer, cirrhosis who was brought to ED after a fall. By report he was drinking with a friend when he fell forward. Patient is amnestic to events surrounding the fall. He underwent CT head/C spine which is significant for an acute right SDH and C2 fracture (managed with Aspen collar).   OT comments  Pt with increased lethargy this session and difficult to fully arouse despite mobility attempts (suspect due to medications). Pt initially responsive to therapist upon entering room and assisting with LEs towards EOB when transitioning to sitting, though once sitting pt with increased lethargy and does not respond to commands nor open eyes. Attempted hand over hand assist for simple grooming task with pt overall requiring totalA at this time. Vitals monitored and stable throughout session with pt on 3-4L O2. Will continue per POC at this time.    Follow Up Recommendations  SNF;Supervision/Assistance - 24 hour    Equipment Recommendations  Other (comment)(TBA)          Precautions / Restrictions Precautions Precautions: Fall;Cervical Precaution Booklet Issued: No Required Braces or Orthoses: Cervical Brace Cervical Brace: Hard collar;At all times Restrictions Weight Bearing Restrictions: No       Mobility Bed Mobility Overal bed mobility: Needs Assistance Bed Mobility: Supine to Sit;Sit to Supine     Supine to sit: +2 for physical assistance;HOB elevated;Max assist;+2 for safety/equipment Sit to supine: Total assist;+2 for physical assistance;+2 for safety/equipment   General bed mobility comments: Pt moved legs partially off of bed when transitioning to sitting; increased lethargy while sitting upright requiring totalA to  return to supine  Transfers Overall transfer level: Needs assistance Equipment used: 2 person hand held assist Transfers: Sit to/from Stand Sit to Stand: Total assist;+2 physical assistance;+2 safety/equipment         General transfer comment: attempted partial stand from EOB to scoot hips close towards [facility] prior to return to supine; pt with minimal increase in arouse with mobility attempts and requiring two person totalA    Balance Overall balance assessment: Needs assistance Sitting-balance support: No upper extremity supported;Feet supported Sitting balance-Leahy Scale: Zero Sitting balance - Comments: maxA to maintain upright sitting balance                                   ADL either performed or assessed with clinical judgement   ADL Overall ADL's : Needs assistance/impaired     Grooming: Total assistance;Wash/dry face;Sitting Grooming Details (indicate cue type and reason): trialed hand over hand assist for simple grooming task; pt unable to participate due to level of lethargy                                                       Cognition Arousal/Alertness: Lethargic;Suspect due to medications Behavior During Therapy: Flat affect Overall Cognitive Status: Difficult to assess                                 General Comments: pt very lethergic this session;  minimally responsive to therapists at start and end of session but overall unable to fully arouse even with mobility attemps        Exercises     Shoulder Instructions       General Comments VSS throughout; pt on 3-4L O2 during session    Pertinent Vitals/ Pain       Pain Assessment: Faces Faces Pain Scale: No hurt Pain Intervention(s): Monitored during session  Home Living                                          Prior Functioning/Environment              Frequency  Min 2X/week        Progress Toward Goals  OT  Goals(current goals can now be found in the care plan section)  Progress towards OT goals: OT to reassess next treatment  Acute Rehab OT Goals Patient Stated Goal: none stated(Simultaneous filing. User may not have seen previous data.) OT Goal Formulation: With patient Time For Goal Achievement: 05/20/18 Potential to Achieve Goals: Good  Plan Discharge plan remains appropriate    Co-evaluation    PT/OT/SLP Co-Evaluation/Treatment: Yes Reason for Co-Treatment: Complexity of the patient's impairments (multi-system involvement);Necessary to address cognition/behavior during functional activity;For patient/therapist safety;To address functional/ADL transfers PT goals addressed during session: Mobility/safety with mobility OT goals addressed during session: ADL's and self-care      AM-PAC OT "6 Clicks" Daily Activity     Outcome Measure   Help from another person eating meals?: Total Help from another person taking care of personal grooming?: Total Help from another person toileting, which includes using toliet, bedpan, or urinal?: Total Help from another person bathing (including washing, rinsing, drying)?: Total Help from another person to put on and taking off regular upper body clothing?: Total Help from another person to put on and taking off regular lower body clothing?: Total 6 Click Score: 6    End of Session Equipment Utilized During Treatment: Oxygen  OT Visit Diagnosis: Unsteadiness on feet (R26.81);Other abnormalities of gait and mobility (R26.89);Muscle weakness (generalized) (M62.81);History of falling (Z91.81);Other symptoms and signs involving cognitive function   Activity Tolerance Patient limited by lethargy   Patient Left in bed;with call bell/phone within reach;with bed alarm set;with restraints reapplied   Nurse Communication Mobility status        Time: 2297-9892 OT Time Calculation (min): 18 min  Charges: OT General Charges $OT Visit: 1 Visit OT  Treatments $Self Care/Home Management : 8-22 mins  Lou Cal, [place] Pager [phone] Office [place] 05/09/2018, 10:01 AM

## 2018-05-09 NOTE — Care Management Important Message (Signed)
Important Message  Patient Details  Name: [name] MRN: [id number] Date of Birth: [birth year]   Medicare Important Message Given:  Yes    Orbie Pyo 05/09/2018, 3:52 PM

## 2018-05-09 NOTE — Progress Notes (Signed)
  NEUROSURGERY PROGRESS NOTE   Events from weekend reviewed. TH on board managing medical comobidities. No change neurologically.  EXAM:  BP (!) 172/160   Pulse 84   Temp (!) 97.3 F (36.3 C) (Oral)   Resp (!) 22   Wt 88.2 kg   SpO2 97%   BMI 26.01 kg/m   Drowsy but just given meds. Opens eyes to voice Briefly followed commands and MAE  IMPRESSION/PLAN 72 y.o.malewith right SDH/temporal contusions and C2 fracture s/p fall. Now with ?CHF vs PNA, etoh w/d. - No change in NS plan of care - Continue current care per [place], PA-C [facility]

## 2018-05-09 NOTE — Progress Notes (Signed)
SLP Cancellation Note  Patient Details Name: [name] MRN: [id number] DOB: Oct 29, 1945   Cancelled treatment:        Per RN pt with decreased alertness and other times very restless. Now with possible pna. SLP recommended NPO until more alert 5/1 following MBS; Puree diet/nectar thick liquids ordered by RN 5/2- suspect he may not have been alert enough at that time to initiate po's. Currently no nutritional support. ST will follow however recommend short term alternate means of nutrition.    [name] 05/09/2018, 2:34 PM   Orbie Pyo Colvin Caroli.Ed Risk analyst [phone] Office [phone]

## 2018-05-09 NOTE — Progress Notes (Signed)
Physical Therapy Treatment Patient Details Name: [name] MRN: [id number] DOB: Feb 08, 1945 Today's Date: 05/09/2018    History of Present Illness [name] is a 73 y.o. male With history of diabetes, hyperlipidemia, hypertension, prostate cancer, cirrhosis who was brought to ED after a fall. By report he was drinking with a friend when he fell forward. Patient is amnestic to events surrounding the fall. He underwent CT head/C spine which is significant for an acute right SDH and C2 fracture (managed with Aspen collar).    PT Comments    Patient seen for activity progression and therapeutic intervention. Session limited due to lethargy, attempted EOB and seated activities with limited engagement (suspect medication induced). Current POC remains appropriate.   Follow Up Recommendations  SNF;Supervision/Assistance - 24 hour     Equipment Recommendations  Other (comment)(tbd)    Recommendations for Other Services       Precautions / Restrictions Precautions Precautions: Fall;Cervical Precaution Booklet Issued: No Required Braces or Orthoses: Cervical Brace Cervical Brace: Hard collar;At all times Restrictions Weight Bearing Restrictions: No    Mobility  Bed Mobility Overal bed mobility: Needs Assistance Bed Mobility: Supine to Sit;Sit to Supine     Supine to sit: +2 for physical assistance;HOB elevated;Max assist;+2 for safety/equipment Sit to supine: Total assist;+2 for physical assistance;+2 for safety/equipment   General bed mobility comments: Pt moved legs partially off of bed when transitioning to sitting; increased lethargy while sitting upright requiring totalA to return to supine  Transfers Overall transfer level: Needs assistance Equipment used: 2 person hand held assist Transfers: Sit to/from Stand Sit to Stand: Total assist;+2 physical assistance;+2 safety/equipment         General transfer comment: attempted partial stand from EOB to scoot  hips close towards [facility] prior to return to supine; pt with minimal increase in arouse with mobility attempts and requiring two person totalA  Ambulation/Gait                 Stairs             Wheelchair Mobility    Modified Rankin (Stroke Patients Only)       Balance Overall balance assessment: Needs assistance Sitting-balance support: No upper extremity supported;Feet supported Sitting balance-Leahy Scale: Zero Sitting balance - Comments: maxA to maintain upright sitting balance                                    Cognition Arousal/Alertness: Lethargic;Suspect due to medications Behavior During Therapy: Flat affect Overall Cognitive Status: Difficult to assess                                 General Comments: pt very lethergic this session; minimally responsive to therapists at start and end of session but overall unable to fully arouse even with mobility attemps      Exercises      General Comments General comments (skin integrity, edema, etc.): VSS throughout; pt on 3-4L O2 during session      Pertinent Vitals/Pain Pain Assessment: Faces Faces Pain Scale: No hurt Pain Intervention(s): Monitored during session    Home Living                      Prior Function            PT Goals (current goals can now  be found in the care plan section) Acute Rehab PT Goals Patient Stated Goal: none stated(Simultaneous filing. User may not have seen previous data.) PT Goal Formulation: With patient Time For Goal Achievement: 05/20/18 Potential to Achieve Goals: Fair Progress towards PT goals: Not progressing toward goals - comment(suspect medications)    Frequency    Min 3X/week      PT Plan Current plan remains appropriate    Co-evaluation PT/OT/SLP Co-Evaluation/Treatment: Yes Reason for Co-Treatment: Complexity of the patient's impairments (multi-system involvement);Necessary to address cognition/behavior  during functional activity;For patient/therapist safety;To address functional/ADL transfers PT goals addressed during session: Mobility/safety with mobility OT goals addressed during session: ADL's and self-care      AM-PAC PT "6 Clicks" Mobility   Outcome Measure  Help needed turning from your back to your side while in a flat bed without using bedrails?: A Lot Help needed moving from lying on your back to sitting on the side of a flat bed without using bedrails?: A Lot Help needed moving to and from a bed to a chair (including a wheelchair)?: A Lot Help needed standing up from a chair using your arms (e.g., wheelchair or bedside chair)?: A Lot Help needed to walk in hospital room?: A Lot Help needed climbing 3-5 steps with a railing? : Total 6 Click Score: 11    End of Session Equipment Utilized During Treatment: Cervical collar Activity Tolerance: Patient tolerated treatment well Patient left: in bed;with call bell/phone within reach;with bed alarm set;with restraints reapplied Nurse Communication: Mobility status PT Visit Diagnosis: Unsteadiness on feet (R26.81);Other abnormalities of gait and mobility (R26.89);History of falling (Z91.81);Muscle weakness (generalized) (M62.81);Difficulty in walking, not elsewhere classified (R26.2)     Time: 1443-1540 PT Time Calculation (min) (ACUTE ONLY): 18 min  Charges:                        Alben Deeds, PT DPT  Board Certified Neurologic Specialist Acute Rehabilitation Services Pager [phone] Office [place] 05/09/2018, 10:01 AM

## 2018-05-09 NOTE — NC FL2 (Signed)
[place] LEVEL OF CARE SCREENING TOOL     IDENTIFICATION  Patient Name: [name] Birthdate: 27-Jun-1945 Sex: male Admission Date (Current Location): 05/04/2018  [facility] and [place] Number:  Herbalist and Address:  [facility] [place], [place] [address] [place], [place] [address]      Provider Number: [id number]  Attending Physician Name and Address:  Consuella Lose, MD  Relative Name and Phone Number:  Alicia Amel [phone]    Current Level of Care: Hospital Recommended Level of Care: [place] Prior Approval Number:    Date Approved/Denied:   PASRR Number: Pending  Discharge Plan: Home    Current Diagnoses: Patient Active Problem List   Diagnosis Date Noted  . Respiratory distress 05/08/2018  . Alcohol abuse 05/08/2018  . Subdural hematoma ([place]) 05/04/2018  . Prostate cancer ([place]) 04/16/2017  . Malignant neoplasm of prostate ([place]) 01/07/2017  . Hepatic cirrhosis ([place]) 08/17/2013  . Diverticulosis 07/19/2013  . DIARRHEA 08/22/2008  . COLITIS 08/21/2008  . DIVERTICULITIS, COLON 08/21/2008  . Diabetes mellitus type 2 in nonobese ([place]) 08/20/2008  . HYPERLIPIDEMIA 08/20/2008  . SMOKER 08/20/2008  . Essential hypertension 08/20/2008  . EXTERNAL HEMORRHOIDS 08/20/2008  . COLONIC POLYPS, ADENOMATOUS, HX OF 08/20/2008    Orientation RESPIRATION BLADDER Height & Weight     Self  O2(4L) Incontinent, External catheter Weight: 194 lb 7.1 oz (88.2 kg) Height:     BEHAVIORAL SYMPTOMS/MOOD NEUROLOGICAL BOWEL NUTRITION STATUS      Incontinent Diet(NPO)  AMBULATORY STATUS COMMUNICATION OF NEEDS Skin   Extensive Assist Verbally(Impaired somewhat) Normal                       Personal Care Assistance Level of Assistance  Bathing, Feeding, Dressing Bathing Assistance: Maximum assistance Feeding assistance: Maximum assistance Dressing Assistance: Maximum assistance     Functional  Limitations Info  Sight, Hearing, Speech Sight Info: Adequate Hearing Info: Adequate Speech Info: Adequate    SPECIAL CARE FACTORS FREQUENCY  PT (By licensed PT), OT (By licensed OT)     PT Frequency: 5x weekly OT Frequency: 5x weekly            Contractures Contractures Info: Not present    Additional Factors Info  Code Status, Allergies, Insulin Sliding Scale Code Status Info: Full Code Allergies Info: Metronidazole   Insulin Sliding Scale Info: insulin aspart (novoLOG) injection 0-15 Units 3x daily with meals       Current Medications (05/09/2018):  This is the current hospital active medication list Current Facility-Administered Medications  Medication Dose Route Frequency Provider Last Rate Last Dose  . 0.9 %  sodium chloride infusion (Manually program via Guardrails IV Fluids)   Intravenous Once Costella, Vista Mink, PA-C      . acetaminophen (TYLENOL) tablet 650 mg  650 mg Oral Q6H PRN Traci Sermon, PA-C   650 mg at 05/05/18 5621   Or  . acetaminophen (TYLENOL) suppository 650 mg  650 mg Rectal Q6H PRN Traci Sermon, PA-C   650 mg at 05/04/18 2121  . bisacodyl (DULCOLAX) EC tablet 5 mg  5 mg Oral Daily PRN Costella, Vista Mink, PA-C      . calcium carbonate (TUMS - dosed in mg elemental calcium) chewable tablet 200 mg of elemental calcium  1 tablet Oral PRN Costella, Vista Mink, PA-C      . chlorhexidine (PERIDEX) 0.12 % solution 15 mL  15 mL Mouth Rinse BID Nundkumar,  Nena Polio, MD   15 mL at 05/09/18 1749  . folic acid injection 1 mg  1 mg Intravenous Daily Costella, Vista Mink, PA-C   1 mg at 05/09/18 4496  . haloperidol lactate (HALDOL) injection 5 mg  5 mg Intravenous Q6H PRN Erline Levine, MD   5 mg at 05/09/18 0834  . hydrALAZINE (APRESOLINE) injection 5-20 mg  5-20 mg Intravenous Q4H PRN Costella, Vista Mink, PA-C   20 mg at 05/06/18 1243  . insulin aspart (novoLOG) injection 0-15 Units  0-15 Units Subcutaneous TID WC Erline Levine, MD   2 Units at 05/07/18  1313  . insulin aspart (novoLOG) injection 0-9 Units  0-9 Units Subcutaneous Q4H PRN Costella, Vista Mink, PA-C      . levETIRAcetam (KEPPRA) IVPB 500 mg/100 mL premix  500 mg Intravenous Q12H Costella, Vista Mink, PA-C 400 mL/hr at 05/08/18 2331 500 mg at 05/08/18 2331  . levofloxacin (LEVAQUIN) IVPB 500 mg  500 mg Intravenous Q24H Consuella Lose, MD 100 mL/hr at 05/09/18 0900 500 mg at 05/09/18 0900  . lisinopril (ZESTRIL) tablet 20 mg  20 mg Oral QPM Costella, Vincent J, PA-C   20 mg at 05/07/18 1817  . loratadine (CLARITIN) tablet 10 mg  10 mg Oral Daily PRN Costella, Vista Mink, PA-C      . LORazepam (ATIVAN) injection 1 mg  1 mg Intravenous Q6H Karmen Bongo, MD   1 mg at 05/09/18 0650  . LORazepam (ATIVAN) injection 2-3 mg  2-3 mg Intravenous Q1H PRN Costella, Vincent J, PA-C   2 mg at 05/08/18 1250  . MEDLINE mouth rinse  15 mL Mouth Rinse q12n4p Consuella Lose, MD   15 mL at 05/08/18 1641  . nicotine (NICODERM CQ - dosed in mg/24 hours) patch 14 mg  14 mg Transdermal Daily Meyran, Ocie Cornfield, NP   14 mg at 05/09/18 0815  . ondansetron (ZOFRAN) tablet 4 mg  4 mg Oral Q6H PRN Costella, Vista Mink, PA-C       Or  . ondansetron (ZOFRAN) injection 4 mg  4 mg Intravenous Q6H PRN Costella, Vista Mink, PA-C   4 mg at 05/05/18 7591  . oxyCODONE (Oxy IR/ROXICODONE) immediate release tablet 5 mg  5 mg Oral Q4H PRN Costella, Vista Mink, PA-C      . pravastatin (PRAVACHOL) tablet 20 mg  20 mg Oral QHS Costella, Vincent J, PA-C   20 mg at 05/05/18 2148  . pregabalin (LYRICA) capsule 150 mg  150 mg Oral BID Traci Sermon, PA-C   150 mg at 05/07/18 1047  . promethazine (PHENERGAN) injection 12.5-25 mg  12.5-25 mg Intravenous Q6H PRN Traci Sermon, PA-C   25 mg at 05/04/18 1845  . sodium phosphate (FLEET) 7-19 GM/118ML enema 1 enema  1 enema Rectal Once PRN Costella, Vista Mink, PA-C      . spironolactone (ALDACTONE) tablet 25 mg  25 mg Oral Daily Karmen Bongo, MD      . thiamine  (B-1) injection 100 mg  100 mg Intravenous Daily Costella, Vincent J, PA-C   100 mg at 05/09/18 0900  . zolpidem (AMBIEN) tablet 5 mg  5 mg Oral QHS PRN Traci Sermon, PA-C         Discharge Medications: Please see discharge summary for a list of discharge medications.  Relevant Imaging Results:  Relevant Lab Results:   Additional Information [id number] [place], [place]

## 2018-05-10 LAB — GLUCOSE, CAPILLARY
Glucose-Capillary: 121 mg/dL — ABNORMAL HIGH (ref 70–99)
Glucose-Capillary: 86 mg/dL (ref 70–99)
Glucose-Capillary: 96 mg/dL (ref 70–99)
Glucose-Capillary: 86 mg/dL (ref 70–99)

## 2018-05-10 LAB — BASIC METABOLIC PANEL
Anion gap: 16 — ABNORMAL HIGH (ref 5–15)
BUN: 10 mg/dL (ref 8–23)
CO2: 29 mmol/L (ref 22–32)
Calcium: 8.1 mg/dL — ABNORMAL LOW (ref 8.9–10.3)
Chloride: 91 mmol/L — ABNORMAL LOW (ref 98–111)
Creatinine, Ser: 0.84 mg/dL (ref 0.61–1.24)
GFR calc Af Amer: >60 mL/min (ref >60)
GFR calc non Af Amer: >60 mL/min (ref >60)
Glucose, Bld: 124 mg/dL — ABNORMAL HIGH (ref 70–99)
Potassium: 2.9 mmol/L — ABNORMAL LOW (ref 3.5–5.1)
Sodium: 136 mmol/L (ref 135–145)

## 2018-05-10 LAB — CBC
HCT: 38.4 % — ABNORMAL LOW (ref 39.0–52.0)
Hemoglobin: 14.4 g/dL (ref 13.0–17.0)
MCH: 34 pg (ref 26.0–34.0)
MCHC: 37.5 g/dL — ABNORMAL HIGH (ref 30.0–36.0)
MCV: 90.6 fL (ref 80.0–100.0)
Platelets: 56 10*3/uL — ABNORMAL LOW (ref 150–400)
RBC: 4.24 MIL/uL (ref 4.22–5.81)
RDW: 13.5 % (ref 11.5–15.5)
WBC: 3.6 10*3/uL — ABNORMAL LOW (ref 4.0–10.5)
nRBC: 0 % (ref 0.0–0.2)

## 2018-05-10 MED ORDER — BACITRACIN ZINC 500 UNIT/GM EX OINT
TOPICAL_OINTMENT | Freq: Two times a day (BID) | CUTANEOUS | Status: DC
  Administered 2018-05-10: 1 via TOPICAL
  Administered 2018-05-10 – 2018-05-18 (×17): via TOPICAL
  Administered 2018-05-19: 1 via TOPICAL
  Administered 2018-05-19 – 2018-05-24 (×10): via TOPICAL
  Filled 2018-05-10: qty 28.4

## 2018-05-10 MED ORDER — LORAZEPAM 1 MG PO TABS: 1.0000 mg | ORAL_TABLET | Freq: Four times a day (QID) | ORAL | Status: AC | PRN

## 2018-05-10 MED ORDER — SODIUM CHLORIDE 0.9% FLUSH: 10.0000 mL | INTRAVENOUS | Status: DC | PRN

## 2018-05-10 MED ORDER — POTASSIUM CHLORIDE 10 MEQ/100ML IV SOLN
10.0000 meq | INTRAVENOUS | Status: AC
  Administered 2018-05-10 (×5): 10 meq via INTRAVENOUS
  Filled 2018-05-10 (×5): qty 100

## 2018-05-10 MED ORDER — LORAZEPAM 2 MG/ML IJ SOLN
1.0000 mg | Freq: Four times a day (QID) | INTRAMUSCULAR | Status: AC | PRN
  Administered 2018-05-12: 1 mg via INTRAVENOUS
  Filled 2018-05-10 (×2): qty 1

## 2018-05-10 NOTE — Social Work (Addendum)
Acknowledging consult for advanced directives- at this time due to the coronavirus restrictions on visitors/volunteers to serve as witness we do not have the capacity to fully complete advanced directives here at the hospital. This patient also is unable to participate in completing his advanced directives due to current mentation. Following for medical stability to initiate SNF search.  Westley Hummer, MSW, [place] Work [phone]

## 2018-05-10 NOTE — Progress Notes (Signed)
Initial Nutrition Assessment   RD working remotely.  DOCUMENTATION CODES:   Not applicable  INTERVENTION:  If pt continues to remain NPO >/= 24-48 hours, recommend initiation of enteral nutrition.  Recommend bolus feeds using Osmolite 1.5 formula and start at volume of 120 ml (half carton/ARC) and advance by 120 ml every feeding to goal volume of 480 ml (2 cartons/ARCs) given TID.  Provide 30 ml Prostat once daily.  Tube feeding regimen to provide 2260 kcal, 107 grams of protein, 1094 ml free water.   NUTRITION DIAGNOSIS:   Inadequate oral intake related to inability to eat as evidenced by NPO status.  GOAL:   Patient will meet greater than or equal to 90% of their needs  MONITOR:   Labs, Weight trends, I & O's, Skin, Diet advancement  REASON FOR ASSESSMENT:   NPO/Clear Liquid Diet, Low Braden    ASSESSMENT:   73 year old male with history of metastatic prostate cancer, Liver cirrhosis, diabetes mellitus, hypertension, alcohol abuse, tobacco abuse was admitted to neurosurgery on 4/29 with right intraparenchymal hematoma, subdural hematoma and a C2 fracture.  Per MD, pt continues to have encephalopathy/acute alcohol withdrawal and remains confused sedated on Ativan. Pt with no nutrition since admission. Pt continues NPO status. Pt no safe for PO at this time per SLP. If pt continues to remain NPO >/= 24-48 hours, recommend initiation of enteral nutrition.   Unable to complete Nutrition-Focused physical exam at this time.   Labs and medications reviewed.   Diet Order:   Diet Order            Diet NPO time specified  Diet effective now              EDUCATION NEEDS:   Not appropriate for education at this time  Skin:  Skin Assessment: Reviewed RN Assessment  Last BM:  5/4  Height:   Ht Readings from Last 1 Encounters:  05/09/18 6' 0.52" (1.842 m)    Weight:   Wt Readings from Last 1 Encounters:  05/09/18 88.2 kg    Ideal Body Weight:  82.3  kg  BMI:  Body mass index is 25.99 kg/m.  Estimated Nutritional Needs:   Kcal:  4034-7425  Protein:  105-120 grams  Fluid:  Per MD    Corrin Parker, MS, RD, LDN Pager # [phone] After hours/ weekend pager # [phone]

## 2018-05-10 NOTE — Progress Notes (Signed)
SLP Cancellation Note  Patient Details Name: [name] MRN: [id number] DOB: 1945-04-17   Cancelled treatment:        Pt had Ativan this am around 8:30 and was unable to safely arouse for SLP- not safe for po trials at present. RN reported plan is to hold off on sedation if able to allow him to wake. Therapist plans to check on him tomorrow.    [name] 05/10/2018, 12:21 PM  Orbie Pyo Colvin Caroli.Ed Actor Pager [phone] Office [phone]  s

## 2018-05-10 NOTE — Progress Notes (Signed)
PROGRESS NOTE    [name]  YNW:[id number] DOB: [birth year] DOA: 05/04/2018 PCP: [name] Squibb, MD  Brief Narrative: [name] is a 73 year old male with history of metastatic prostate cancer, Liver cirrhosis, diabetes mellitus, hypertension, alcohol abuse, tobacco abuse was admitted to neurosurgery on 4/29 with right intraparenchymal hematoma, subdural hematoma and a C2 fracture. -He had been awaiting SNF placement, through his hospitalization he was noted to have increased confusion and agitation as well as respiratory distress. -[facility] was consulted 5/3 due to increased agitation and respiratory distress -Was felt to have alcohol withdrawal, started on Ativan -Also noted to have hypoglycemia, pulmonary edema and pleural effusions -5/4: [facility] took over from [name]:   Right intraparenchymal hematoma, extensive right subdural hematoma and possible intraventricular hemorrhage with traumatic C2 fracture -Following a fall -INR 1.2, has chronic thrombocytopenia from liver cirrhosis -Admitted by neurosurgery, currently in C collar confused -managed non operatively -Repeat CT head from 5/4 overall stable, discussed with [name]  Encephalopathy /acute alcohol withdrawal  -Alcohol level was markedly elevated on admission at 212 -On day 2 of hospitalization patient was noted to have increased agitation and confusion consistent with timeline for acute withdrawal -Patient remains sedated on Ativan, will discontinue scheduled Ativan and continue PRN per CIWA protocol only  -Repeat CT head is stable as noted above  -Continue thiamine  -Add low-dose Seroquel at bedtime for delirium as well  -If mental status does not improve in the next day or so will need to place cortrak and start tube feeds -May need palliative care discussions down the road if does not stabilize and improve  Fluid overload -Likely secondary to liver cirrhosis with hypoalbuminemia and third  spacing -Chest x-ray and CTA chest 5/3 was consistent with pulmonary edema and pleural effusions -COVID PCR is negative -Off IV fluids, given Lasix 5/3 and 5/4 -Appears euvolemic now, monitor off diuretics  Liver Cirrhosis -Suspect alcoholic liver disease given long history of alcoholism -Chronic thrombocytopenia associated with liver disease -Currently volume overloaded IV Lasix as noted above  Heavy tobacco abuse/suspected COPD -Per niece smoked 2 packs/day for 50 years, i.e. 100 pack year -Likely has underlying COPD from this -Respiratory status overall stable  DM -Holding Metformin -Moderate-scale SSI  HTN -On Lisinopril  Metastatic Prostate CA -s/p seed implants -Recent PSA reportedly normal  DVT prophylaxis: SCDs Code Status: DNR now-called and discussed with niece [name], patient is unmarried without kids or living siblings, [name] his niece is his only next of kin who was involved/engaged in his life Family Communication: No family at bedside, called and d/w Niece [name] 5/4 and 5/5 Disposition Plan: To be determined high risk of complications    Antimicrobials:    Subjective: -Remains confused and sedated on Ativan -No new events otherwise per staff  Objective: Vitals:   05/09/18 2335 05/10/18 0414 05/10/18 0733 05/10/18 1250  BP: (!) 149/94  134/89 (!) 151/95  Pulse: (!) 105  (!) 104 82  Resp:   20 20  Temp: 97.8 F (36.6 C) 97.8 F (36.6 C) 98.2 F (36.8 C) 98.4 F (36.9 C)  TempSrc: Oral Oral  Oral  SpO2:    90%  Weight:      Height:        Intake/Output Summary (Last 24 hours) at 05/10/2018 1313 Last data filed at 05/10/2018 0800 Gross per 24 hour  Intake 339.97 ml  Output 1750 ml  Net -1410.03 ml   Filed Weights   05/08/18 0900  05/09/18 2300  Weight: 88.2 kg 88.2 kg    Examination:  Gen: Somnolent, moans, withdraws to painful stimuli HEENT: Pupils dilated, reactive Lungs: Few basilar crackles, rest is clear CVS:  S1-S2/regular rate rhythm Abd: soft, Non tender, non distended, BS present Extremities: No edema Skin: no new rashes Central nervous system: Obtunded, sedated from medications, limited CNS exam, pupils are dilated, reactive Psychiatry: Unable to assess   Data Reviewed:   CBC: Recent Labs  Lab 05/04/18 0300 05/08/18 1237 05/09/18 0758 05/10/18 0343  WBC 1.7* 3.3* 2.8* 3.6*  NEUTROABS 0.9* 2.4  --   --   HGB 13.1 13.1 13.1 14.4  HCT 37.6* 36.3* 35.5* 38.4*  MCV 96.4 93.3 91.3 90.6  PLT 35* 59* 53* 56*   Basic Metabolic Panel: Recent Labs  Lab 05/04/18 0300 05/08/18 0629 05/09/18 0758 05/10/18 0343  NA 136 138 135 136  K 3.7 3.2* 3.2* 2.9*  CL 101 102 98 91*  CO2 24 26 28 29   GLUCOSE 112* 107* 103* 124*  BUN 6* 11 9 10   CREATININE 0.94 0.75 0.78 0.84  CALCIUM 8.3* 7.9* 8.0* 8.1*   GFR: Estimated Creatinine Clearance: 87.3 mL/min (by C-G formula based on SCr of 0.84 mg/dL). Liver Function Tests: Recent Labs  Lab 05/08/18 1237 05/09/18 0758  AST 30 28  ALT 25 25  ALKPHOS 144* 145*  BILITOT 3.6* 4.1*  PROT 5.5* 5.3*  ALBUMIN 2.7* 2.7*   No results for input(s): LIPASE, AMYLASE in the last 168 hours. No results for input(s): AMMONIA in the last 168 hours. Coagulation Profile: Recent Labs  Lab 05/04/18 0300  INR 1.2   Cardiac Enzymes: No results for input(s): CKTOTAL, CKMB, CKMBINDEX, TROPONINI in the last 168 hours. BNP (last 3 results) No results for input(s): PROBNP in the last 8760 hours. HbA1C: No results for input(s): HGBA1C in the last 72 hours. CBG: Recent Labs  Lab 05/09/18 0717 05/09/18 1215 05/09/18 1618 05/09/18 2224 05/10/18 0731  GLUCAP 97 98 91 94 121*   Lipid Profile: No results for input(s): CHOL, HDL, LDLCALC, TRIG, CHOLHDL, LDLDIRECT in the last 72 hours. Thyroid Function Tests: No results for input(s): TSH, T4TOTAL, FREET4, T3FREE, THYROIDAB in the last 72 hours. Anemia Panel: No results for input(s): VITAMINB12, FOLATE,  FERRITIN, TIBC, IRON, RETICCTPCT in the last 72 hours. Urine analysis:    Component Value Date/Time   COLORURINE AMBER (A) 04/30/2017 1815   APPEARANCEUR CLOUDY (A) 04/30/2017 1815   LABSPEC 1.024 04/30/2017 1815   PHURINE 5.0 04/30/2017 1815   GLUCOSEU >=500 (A) 04/30/2017 1815   HGBUR LARGE (A) 04/30/2017 1815   BILIRUBINUR NEGATIVE 04/30/2017 1815   KETONESUR NEGATIVE 04/30/2017 1815   PROTEINUR 100 (A) 04/30/2017 1815   UROBILINOGEN 0.2 03/28/2010 1427   NITRITE NEGATIVE 04/30/2017 1815   LEUKOCYTESUR LARGE (A) 04/30/2017 1815   Sepsis Labs: @LABRCNTIP (procalcitonin:4,lacticidven:4)  ) Recent Results (from the past 240 hour(s))  MRSA PCR Screening     Status: None   Collection Time: 05/04/18  6:37 AM  Result Value Ref Range Status   MRSA by PCR NEGATIVE NEGATIVE Final    Comment:        The GeneXpert MRSA Assay (FDA approved for NASAL specimens only), is one component of a comprehensive MRSA colonization surveillance program. It is not intended to diagnose MRSA infection nor to guide or monitor treatment for MRSA infections. Performed at [facility], [place] [address] [place], [place] [address]   SARS Coronavirus 2 (CEPHEID- Performed in [facility]  [facility] lab), Hosp Order     Status: None   Collection Time: 05/08/18 11:30 AM  Result Value Ref Range Status   SARS Coronavirus 2 NEGATIVE NEGATIVE Final    Comment: (NOTE) If result is NEGATIVE SARS-CoV-2 target nucleic acids are NOT DETECTED. The SARS-CoV-2 RNA is generally detectable in upper and lower  respiratory specimens during the acute phase of infection. The lowest  concentration of SARS-CoV-2 viral copies this assay can detect is 250  copies / mL. A negative result does not preclude SARS-CoV-2 infection  and should not be used as the sole basis for treatment or other  patient management decisions.  A negative result may occur with  improper specimen collection / handling, submission of specimen  other  than nasopharyngeal swab, presence of viral mutation(s) within the  areas targeted by this assay, and inadequate number of viral copies  (<250 copies / mL). A negative result must be combined with clinical  observations, patient history, and epidemiological information. If result is POSITIVE SARS-CoV-2 target nucleic acids are DETECTED. The SARS-CoV-2 RNA is generally detectable in upper and lower  respiratory specimens dur ing the acute phase of infection.  Positive  results are indicative of active infection with SARS-CoV-2.  Clinical  correlation with patient history and other diagnostic information is  necessary to determine patient infection status.  Positive results do  not rule out bacterial infection or co-infection with other viruses. If result is PRESUMPTIVE POSTIVE SARS-CoV-2 nucleic acids MAY BE PRESENT.   A presumptive positive result was obtained on the submitted specimen  and confirmed on repeat testing.  While 2019 novel coronavirus  (SARS-CoV-2) nucleic acids may be present in the submitted sample  additional confirmatory testing may be necessary for epidemiological  and / or clinical management purposes  to differentiate between  SARS-CoV-2 and other Sarbecovirus currently known to infect humans.  If clinically indicated additional testing with an alternate test  methodology [phone]) is advised. The SARS-CoV-2 RNA is generally  detectable in upper and lower respiratory sp ecimens during the acute  phase of infection. The expected result is Negative. Fact Sheet for Patients:  StrictlyIdeas.no Fact Sheet for Healthcare Providers: BankingDealers.co.za This test is not yet approved or cleared by the [place] FDA and has been authorized for detection and/or diagnosis of SARS-CoV-2 by FDA under an Emergency Use Authorization (EUA).  This EUA will remain in effect (meaning this test can be used) for the duration  of the COVID-19 declaration under Section 564(b)(1) of the Act, 21 U.S.C. section 360bbb-3(b)(1), unless the authorization is terminated or revoked sooner. Performed at [facility], [place] [address] [place], [place] [address]          Radiology Studies: Ct Head Wo Contrast  Result Date: 05/09/2018 CLINICAL DATA:  Intracranial hemorrhage, follow-up EXAM: CT HEAD WITHOUT CONTRAST TECHNIQUE: Contiguous axial images were obtained from the base of the skull through the vertex without intravenous contrast. COMPARISON:  Multiple priors, 05/04/2018 and 05/05/2018 FINDINGS: Brain: Modest increase in size of RIGHT temporal intraparenchymal hemorrhage, now 26 x 54 mm, with mild surrounding edema, but concomitant slight decrease in acute RIGHT subdural/epidural extra-axial hematoma. No significant midline shift. Mild layering intraventricular blood. Mild subarachnoid blood, slowly improving. Small cortical shear injuries, LEFT frontal and LEFT posterior frontal gyri, are stable to slightly improved. Slight improvement in interhemispheric and tentorial subdural. Vascular: Calcification of the cavernous internal carotid arteries consistent with cerebrovascular atherosclerotic disease. No signs of intracranial large vessel occlusion. Skull: Calvarium intact.  Sinuses/Orbits: Mucosal thickening in the sphenoid. Some layering fluid in the LEFT division. Chronic LEFT maxillary sinus opacity with chronic tripod fracture, only partially healed. Negative orbits. Other: None. IMPRESSION: 1. Only slight increase in size of RIGHT temporal intraparenchymal hemorrhage, 26 x 54 mm, with mild surrounding edema. No significant midline shift. 2. Slight improvement in extra-axial hematomas. 3. Stable to slightly improved LEFT frontal and LEFT posterior frontal shear injuries. Electronically Signed   By: Staci Righter M.D.   On: 05/09/2018 16:05   Ct Angio Chest Pe W Or Wo Contrast  Result Date: 05/08/2018 CLINICAL DATA:   Tachypnea, labored breathing. EXAM: CT ANGIOGRAPHY CHEST WITH CONTRAST TECHNIQUE: Multidetector CT imaging of the chest was performed using the standard protocol during bolus administration of intravenous contrast. Multiplanar CT image reconstructions and MIPs were obtained to evaluate the vascular anatomy. CONTRAST:  137mL OMNIPAQUE IOHEXOL 350 MG/ML SOLN COMPARISON:  Chest x-ray 05/07/2018 FINDINGS: Cardiovascular: Satisfactory opacification of the pulmonary arteries to the segmental level. No evidence of pulmonary embolism. Stable enlarged heart size. No pericardial effusion. Thoracic aortic atherosclerosis. No thoracic aortic dissection. Coronary artery atherosclerosis. Mediastinum/Nodes: No enlarged mediastinal, hilar, or axillary lymph nodes. Thyroid gland, trachea, and esophagus demonstrate no significant findings. Lungs/Pleura: Bilateral interstitial thickening. Patchy areas of airspace disease with ground-glass opacity in the right upper lobe. small bilateral pleural effusions. Bibasilar atelectasis. Upper Abdomen: No acute abnormality. Musculoskeletal: No chest wall abnormality. No acute or significant osseous findings. Review of the MIP images confirms the above findings. IMPRESSION: 1. No evidence of pulmonary embolus. 2. Findings concerning for mild CHF. 3. Patchy areas of airspace disease with ground-glass opacity in the right upper lobe which may reflect more severe asymmetric pulmonary edema versus less likely secondary to an infectious or inflammatory etiology. 4.  Aortic Atherosclerosis (ICD10-I70.0). Electronically Signed   By: Kathreen Devoid   On: 05/08/2018 14:53        Scheduled Meds:  sodium chloride   Intravenous Once   bacitracin   Topical BID   chlorhexidine  15 mL Mouth Rinse BID   folic acid  1 mg Intravenous Daily   mouth rinse  15 mL Mouth Rinse q12n4p   nicotine  14 mg Transdermal Daily   pravastatin  20 mg Oral QHS   pregabalin  100 mg Oral QHS   thiamine  injection  100 mg Intravenous Daily   Continuous Infusions:  dextrose 5 % with kcl 10 mL/hr at 05/10/18 0800   levETIRAcetam 500 mg (05/10/18 0105)   potassium chloride 10 mEq (05/10/18 1246)     LOS: 6 days    Time spent: 81min    Domenic Polite, MD [facility]  05/10/2018, 1:13 PM

## 2018-05-11 DIAGNOSIS — F10929 Alcohol use, unspecified with intoxication, unspecified: Secondary | ICD-10-CM

## 2018-05-11 DIAGNOSIS — E119 Type 2 diabetes mellitus without complications: Secondary | ICD-10-CM

## 2018-05-11 LAB — GLUCOSE, CAPILLARY
Glucose-Capillary: 98 mg/dL (ref 70–99)
Glucose-Capillary: 100 mg/dL — ABNORMAL HIGH (ref 70–99)
Glucose-Capillary: 105 mg/dL — ABNORMAL HIGH (ref 70–99)
Glucose-Capillary: 97 mg/dL (ref 70–99)

## 2018-05-11 LAB — COMPREHENSIVE METABOLIC PANEL
ALT: 24 U/L (ref 0–44)
AST: 28 U/L (ref 15–41)
Albumin: 2.6 g/dL — ABNORMAL LOW (ref 3.5–5.0)
Alkaline Phosphatase: 155 U/L — ABNORMAL HIGH (ref 38–126)
Anion gap: 15 (ref 5–15)
BUN: 12 mg/dL (ref 8–23)
CO2: 28 mmol/L (ref 22–32)
Calcium: 8.3 mg/dL — ABNORMAL LOW (ref 8.9–10.3)
Chloride: 93 mmol/L — ABNORMAL LOW (ref 98–111)
Creatinine, Ser: 0.84 mg/dL (ref 0.61–1.24)
GFR calc Af Amer: >60 mL/min (ref >60)
GFR calc non Af Amer: >60 mL/min (ref >60)
Glucose, Bld: 90 mg/dL (ref 70–99)
Potassium: 3.6 mmol/L (ref 3.5–5.1)
Sodium: 136 mmol/L (ref 135–145)
Total Bilirubin: 3.6 mg/dL — ABNORMAL HIGH (ref 0.3–1.2)
Total Protein: 5.2 g/dL — ABNORMAL LOW (ref 6.5–8.1)

## 2018-05-11 LAB — CBC
HCT: 37.2 % — ABNORMAL LOW (ref 39.0–52.0)
Hemoglobin: 13.8 g/dL (ref 13.0–17.0)
MCH: 33.7 pg (ref 26.0–34.0)
MCHC: 37.1 g/dL — ABNORMAL HIGH (ref 30.0–36.0)
MCV: 90.7 fL (ref 80.0–100.0)
Platelets: 62 10*3/uL — ABNORMAL LOW (ref 150–400)
RBC: 4.1 MIL/uL — ABNORMAL LOW (ref 4.22–5.81)
RDW: 13.6 % (ref 11.5–15.5)
WBC: 3.5 10*3/uL — ABNORMAL LOW (ref 4.0–10.5)
nRBC: 0 % (ref 0.0–0.2)

## 2018-05-11 MED ORDER — QUETIAPINE 12.5 MG HALF TABLET
12.5000 mg | ORAL_TABLET | Freq: Every day | ORAL | Status: DC
  Administered 2018-05-13 – 2018-05-16 (×4): 12.5 mg
  Filled 2018-05-11 (×7): qty 1

## 2018-05-11 MED ORDER — HYDROMORPHONE HCL 1 MG/ML IJ SOLN
1.0000 mg | Freq: Once | INTRAMUSCULAR | Status: AC
  Administered 2018-05-11: 1 mg via INTRAVENOUS
  Filled 2018-05-11: qty 1

## 2018-05-11 MED ORDER — QUETIAPINE 12.5 MG HALF TABLET: 12.5000 mg | ORAL_TABLET | Freq: Every day | ORAL | Status: DC

## 2018-05-11 NOTE — Consult Note (Signed)
Consultation Note Date: 05/12/2018  Patient Name: [name]  DOB: [birth year]  MRN: [id number]  Age / Sex: 73 y.o., male  PCP: [name] Squibb, MD Referring Physician: Hosie Poisson, MD  Reason for Consultation: Establishing goals of care  HPI/Patient Profile: 73 y.o. male  with past medical history of DM, HLD, HTN, liver cirrhosis, prostate cancer, smoker, ETOH use admitted on 05/04/2018 after fall with alcohol intoxication. Initially admitted to neurosurgery with right intraparenchymal hematoma, subdural hematoma and C2 fracture. TRH consulted on 5/3 due to increased agitation and respiratory distress, felt related to alcohol withdrawal. Ativan initiated. Also noted to have hypoglycemia, pulmonary edema and pleural effusions. Repeat CT head 5/4 stable. Ongoing encephalopathy and agitation. Unable to work with SLP. Cortrak to be placed 5/6. Palliative medicine consultation for goals of care.   Clinical Assessment and Goals of Care:  I have reviewed medical records, discussed with RN and [name], and assessed the patient. Physical therapy got [name] to chair this morning. He will open eyes to sternal rub but remains lethargic. Not following commands. Short periods of apnea and mouth-breather. Oxygen 3L [place]. Discussed with [name].   Shortly after, spoke with niece [name]) via telephone. Unable to meet in person due to Covid-19 visitor restrictions.  Introduced Palliative Medicine as specialized medical care for people living with serious illness. It focuses on providing relief from the symptoms and stress of a serious illness. The goal is to improve quality of life for both the patient and the family.  We discussed a brief life review of the patient. [name] shares that [name] was a "prestigous" man prior to his poor choices (smoking 2ppd and ETOH abuse, drinks whiskey). 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 She shares he is recovering from prostate cancer and has been clinically depressed for many years. Recently moved into section 8 housing and experiencing multiple falls. Lives alone. [name] is closest next-of-kin and calls frequently but has only seen him 2x since March.   Discussed events leading up to admission and course of hospitalization including diagnoses, interventions, and guarded prognosis. [name] recalls her conversation with [name] a few days ago and her understanding of how sick [name] is. She shares significant family history of cardiac conditions.   I attempted to elicit values and goals of care important to the patient/niece. Advanced directives, concepts specific to code status, artifical feeding and hydration were discussed. [name] confirms decision for DNR/DNI code status and speaks of being "at peace" with this decision, sharing how much harder it would be to "pull the plug" on him if on the ventilator.   Discussed ongoing cognitive status decline and agitation limiting his ability to work with therapy and safely be on a diet. [name] asks about feeding tube placement. Explained plan for cortrak to be placed tomorrow, Friday 5/8 and initiation of tube feeds.  Explored if her uncle every expressed his thoughts about prolonged interventions such as a prolonged feeding tube/SNF placement.  [name] shares that prior to worsening depression, [name] was a very well-kempt individual. With the depression/drinking, he has "alienated all of his friends." She shares that in his current state (if cognizant), he would NOT want any visitors "guaking at him." She thinks to herself, "what quality of life would he have in a nursing home?"  [name] states "I don't want to give up on him" but further explains that he would not want his life prolonged in this current state/with poor quality of life if not showing meaningful recovery. "Does not care if he  lives or dies."   Reassured of ongoing support from palliative medicine. Discussed watchful waiting with HS Seroquel and cortrak/tube feeds. Explained that further discussions will occur pending clinical improvement.   Questions and concerns were addressed.   SUMMARY OF RECOMMENDATIONS    DNR/DNI  Otherwise, continue current plan of care and medical management. Watchful waiting.   Cortrak placement Friday, 5/8 with plans to start tube feeds.  Continue PT/OT/SLP attempts.   PMT will follow.     Code Status/Advance Care Planning:  DNR  Symptom Management:   Seroquel 12.5 PO HS agitation  Palliative Prophylaxis:   Aspiration, Delirium Protocol, Oral Care and Turn Reposition   Psycho-social/Spiritual:   Desire for further Chaplaincy support:yes  Additional Recommendations: Caregiving  Support/Resources and Compassionate Wean Education  Prognosis:   Unable to determine  Discharge Planning: To Be Determined      Primary Diagnoses: Present on Admission: . Subdural hematoma ([place]) . Prostate cancer ([place]) . Respiratory distress . Hepatic cirrhosis ([place]) . Essential hypertension . Alcohol abuse   I have reviewed the medical record, interviewed the patient and family, and examined the patient. The following aspects are pertinent.  Past Medical History:  Diagnosis Date  . Anxiety   . Benign localized prostatic hyperplasia with lower urinary tract symptoms (LUTS)   . Depression   . Diverticulosis of colon   . GERD (gastroesophageal reflux disease)   . History of colitis   . History of colonic diverticulitis    03-10-2006 s/p bowel resection   . 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 Hypertension   . Hypertriglyceridemia   . NAFLD (nonalcoholic fatty liver disease)   . OA (osteoarthritis)    knees  . Peripheral neuropathy   . Prostate cancer [facility]) urologist-- [name]  Alyson [name]  oncologist- [name]   dx 12-02-2016-- Stage T1c, Gleason 3+4,  PSA 8.2, 37.8cc-- scheduled for radioactive seed implants 04-16-2017  . S/P insertion of IVC (inferior vena caval) filter 07/02/2006   due to bilateral fermoral dvt  . Seasonal allergies   . Thrombocytopenia ([place])   . Type 2 diabetes mellitus ([place])    followed by pcp  . Weak urinary stream   . Wears glasses    Social History   Socioeconomic History  . Marital status: Single    Spouse name: Not on file  . Number of children: Not on file  . Years of education: Not on file  . Highest education level: Not on file  Occupational History  . Occupation: retired  Scientific laboratory technician  . Financial resource strain: Not on file  . Food insecurity:    Worry: Not on file    Inability: Not on file  . Transportation needs:    Medical: Not on file    Non-medical: Not on file  Tobacco Use  . Smoking status:  Current Every Day Smoker    Packs/day: 0.50    Years: 54.00    Pack years: 27.00    Types: Cigarettes  . Smokeless tobacco: Never Used  Substance and Sexual Activity  . Alcohol use: Yes    Comment: occasional  . Drug use: No  . Sexual activity: Never  Lifestyle  . Physical activity:    Days per week: Not on file    Minutes per session: Not on file  . Stress: Not on file  Relationships  . Social connections:    Talks on phone: Not on file    Gets together: Not on file    Attends religious service: Not on file    Active member of club or organization: Not on file    Attends meetings of clubs or organizations: Not on file    Relationship status: Not on file  Other Topics Concern  . Not on file  Social History Narrative  . Not on file   Family History  Problem Relation Age of Onset  . Cancer Maternal Uncle        colon   Scheduled Meds: . sodium chloride   Intravenous Once  . bacitracin   Topical BID  . chlorhexidine  15 mL Mouth Rinse BID  . folic acid  1 mg Intravenous Daily  . mouth rinse  15 mL  Mouth Rinse q12n4p  . nicotine  14 mg Transdermal Daily  . pravastatin  20 mg Oral QHS  . pregabalin  100 mg Oral QHS  . QUEtiapine  12.5 mg Per Tube QHS  . thiamine injection  100 mg Intravenous Daily   Continuous Infusions: . sodium chloride 75 mL/hr at 05/12/18 1053  . dextrose 5 % with kcl 10 mL/hr at 05/10/18 0800   PRN Meds:.acetaminophen **OR** acetaminophen, bisacodyl, calcium carbonate, haloperidol lactate, hydrALAZINE, insulin aspart, loratadine, LORazepam **OR** LORazepam, ondansetron **OR** ondansetron (ZOFRAN) IV, promethazine, sodium chloride flush, sodium phosphate, zolpidem Medications Prior to Admission:  Prior to Admission medications   Medication Sig Start Date End Date Taking? Authorizing Provider  acetaminophen (TYLENOL) 500 MG tablet Take 1,000 mg by mouth every 6 (six) hours as needed for pain (headache).   Yes [provider]  alfuzosin (UROXATRAL) 10 MG 24 hr tablet Take 10 mg by mouth daily. 04/29/18  Yes [provider]  ALPRAZolam Duanne Moron) 0.5 MG tablet Take 0.5 mg by mouth 3 (three) times daily as needed for anxiety.   Yes [provider]  calcium carbonate (TUMS - DOSED IN MG ELEMENTAL CALCIUM) 500 MG chewable tablet Chew 1 tablet by mouth as needed for indigestion or heartburn.   Yes [provider]  fenofibrate 160 MG tablet Take 160 mg by mouth at bedtime.   Yes [provider]  FLUoxetine (PROZAC) 40 MG capsule Take 40 mg by mouth daily.   Yes [provider]  lisinopril (PRINIVIL,ZESTRIL) 20 MG tablet Take 20 mg by mouth every evening.    Yes [provider]  loratadine (CLARITIN) 10 MG tablet Take 10 mg by mouth daily as needed for allergies.   Yes [provider]  LYRICA 150 MG capsule Take 150 mg by mouth 2 (two) times daily.  12/05/16  Yes [provider]  metformin (FORTAMET) 1000 MG (OSM) 24 hr tablet Take 1,000 mg by mouth at bedtime.   Yes [provider]   naproxen sodium (ALEVE) 220 MG tablet Take 220 mg by mouth 2 (two) times daily as needed (headache/pain).   Yes  [provider]  pravastatin (PRAVACHOL) 20 MG tablet Take 20 mg by mouth at bedtime. 07/07/14  Yes [provider]  zolpidem (AMBIEN) 10 MG tablet Take 10 mg by mouth at bedtime as needed for sleep.    Yes [provider]  cetirizine (ZYRTEC) 10 MG tablet Take 10 mg by mouth daily as needed for allergies.    [provider]  GEMFIBROZIL PO Take 1 tablet by mouth daily.    [provider]   Allergies  Allergen Reactions  . Metronidazole Nausea And Vomiting    REACTION: causes nausea and vomiting   Review of Systems  Unable to perform ROS: Acuity of condition   Physical Exam Vitals signs and nursing note reviewed.  Constitutional:      Appearance: He is ill-appearing.  HENT:     Head: Normocephalic and atraumatic.  Neck:     Comments: C-collar Cardiovascular:     Rate and Rhythm: Normal rate.  Pulmonary:     Effort: No tachypnea, accessory muscle usage or respiratory distress.  Abdominal:     Tenderness: There is no abdominal tenderness.  Skin:    General: Skin is warm and dry.  Neurological:     Mental Status: He is lethargic.     Comments: Opens eyes to voice, otherwise lethargic.  Psychiatric:        Attention and Perception: He is inattentive.        Speech: He is noncommunicative.        Cognition and Memory: Cognition is impaired.    Vital Signs: BP (!) 143/98 (BP Location: Right Arm)   Pulse (!) 43   Temp (!) 97.5 F (36.4 C) (Oral)   Resp 16   Ht 6' 0.52" (1.842 m)   Wt 88.2 kg   SpO2 96%   BMI 25.99 kg/m  Pain Scale: PAINAD   Pain Score: 9    SpO2: SpO2: 96 % O2 Device:SpO2: 96 % O2 Flow Rate: .O2 Flow Rate (L/min): 4 L/min  IO: Intake/output summary:   Intake/Output Summary (Last 24 hours) at 05/12/2018 1321 Last data filed at 05/12/2018 0355 Gross per 24 hour  Intake -  Output 1000 ml  Net  -1000 ml    LBM: Last BM Date: 05/12/18 Baseline Weight: Weight: 88.2 kg Most recent weight: Weight: 88.2 kg     Palliative Assessment/Data: PPS 30%   Flowsheet Rows     Most Recent Value  Intake Tab  Referral Department  Hospitalist  Unit at Time of Referral  Intermediate Care Unit  Palliative Care Primary Diagnosis  Neurology  Palliative Care Type  New Palliative care  Date first seen by Palliative Care  05/11/18  Clinical Assessment  Palliative Performance Scale Score  30%  Psychosocial & Spiritual Assessment  Palliative Care Outcomes  Patient/Family meeting held?  Yes  Who was at the meeting?  niece via telephone  Palliative Care Outcomes  Clarified goals of care, Provided end of life care assistance, Provided psychosocial or spiritual support, ACP counseling assistance      Time In/Out: 0945-1100   Time Total: 46min Greater than 50%  of this time was spent counseling and coordinating care related to the above assessment and plan.  Signed by:  Ihor Dow, DNP, FNP-C Palliative Medicine Team  Phone: [phone] Fax: [phone]   Please contact Palliative Medicine Team phone at [phone] for questions and concerns.  For individual provider: See Shea Evans

## 2018-05-11 NOTE — Progress Notes (Signed)
PT Cancellation Note  Patient Details Name: [name] MRN: [id number] DOB: February 18, 1945   Cancelled Treatment:    Reason Eval/Treat Not Completed: Fatigue/lethargy limiting ability to participate   Duncan Dull 05/11/2018, 4:08 PM

## 2018-05-11 NOTE — Progress Notes (Signed)
PROGRESS NOTE    [name]  PFX:[id number] DOB: 01-Mar-1945 DOA: 05/04/2018 PCP: Celene Squibb, MD    Brief Narrative:   [name] is a 73 year old male with history of metastatic prostate cancer, Liver cirrhosis, diabetes mellitus, hypertension, alcohol abuse, tobacco abuse was admitted to neurosurgery on 4/29 with right intraparenchymal hematoma, subdural hematoma and a C2 fracture. -He had been awaiting SNF placement, through his hospitalization he was noted to have increased confusion and agitation as well as respiratory distress. -TRH was consulted 5/3 due to increased agitation and respiratory distres -Also noted to have hypoglycemia, pulmonary edema and pleural effusions 5/4: TRH took over from [place] service   Assessment & Plan:   Principal Problem:   Subdural hematoma (HCC) Active Problems:   Diabetes mellitus type 2 in nonobese [facility])   Essential hypertension   Hepatic cirrhosis (HCC)   Prostate cancer (H[facility])   Respiratory distress   Alcohol abuse  Right intraparenchymal hematoma Extensive right subdural hematoma and possible intraventricular hemorrhage with traumatic C2 fracture following a fall. Patient was initially admitted by neurosurgery. Nonoperative management recommended by neurosurgery. Patient is currently on c-collar. Neurosurgery recommends patient is stable from their standpoint and transferred patient to [facility] service.    Acute encephalopathy Differentials include alcohol withdrawal versus hospital delirium. Patient is currently on CIWA protocol. Repeat CT head is negative for acute pathology. Patient continues to be somnolent and and agitated at times requiring wrist restraints. Low-dose Seroquel was added at bedtime.  No improvement in patient's mental status in the last 48 hours. Core track to be placed and start tube feeds and palliative care consulted for goals of care and symptom management.   Fluid overload secondary to liver  cirrhosis and hypoalbuminemia Patient is on room air and with good oxygen saturations. Patient is very euvolemic. COVID PCR is negative   Suspected COPD No wheezing heard    Diabetes mellitus CBG (last 3)  Recent Labs    05/10/18 2138 05/11/18 0751 05/11/18 1123  GLUCAP 86 98 100*   Continue with sliding scale insulin while inpatient     hypertension Well-controlled    Metastatic prostate cancer S/p seed implants       DVT prophylaxis: SCD'S Code Status: DNR Family Communication: none at bedside. Will call his niece and update. Disposition Plan:  Pending clinical improvement.    Consultants:   Neuro surgery. Dr Cato Mulligan   Procedures: none.    Antimicrobials: none.    Subjective: Somnolent and withdrawing to painful stimuli.   Objective: Vitals:   05/10/18 1956 05/10/18 2323 05/11/18 0332 05/11/18 0414  BP: (!) 147/91   (!) 169/113  Pulse: 77     Resp: (!) 29     Temp: (!) 97.5 F (36.4 C) 97.6 F (36.4 C) 97.7 F (36.5 C)   TempSrc: Oral Oral Oral   SpO2: 97%     Weight:      Height:        Intake/Output Summary (Last 24 hours) at 05/11/2018 1612 Last data filed at 05/11/2018 0300 Gross per 24 hour  Intake 0 ml  Output 850 ml  Net -850 ml   Filed Weights   05/08/18 0900 05/09/18 2300  Weight: 88.2 kg 88.2 kg    Examination:  General exam:  Somnolent , only responding to painful stimuli.  Respiratory system: Clear to auscultation. Respiratory effort normal. Cardiovascular system: S1 & S2 heard, RRR. Marland Kitchen No pedal edema. Gastrointestinal system: Abdomen is nondistended, soft . Normal bowel sounds heard.  Central nervous system: pupils reactive,  Not responding to verbal cues, only responds to sternal rub with groans. Able to move his lower extremities.  Extremities: no pedal edema.  Skin: No rashes, lesions or ulcers Psychiatry: cannot be assessed.      Data Reviewed: I have personally reviewed following labs and imaging studies   CBC: Recent Labs  Lab 05/08/18 1237 05/09/18 0758 05/10/18 0343 05/11/18 0314  WBC 3.3* 2.8* 3.6* 3.5*  NEUTROABS 2.4  --   --   --   HGB 13.1 13.1 14.4 13.8  HCT 36.3* 35.5* 38.4* 37.2*  MCV 93.3 91.3 90.6 90.7  PLT 59* 53* 56* 62*   Basic Metabolic Panel: Recent Labs  Lab 05/08/18 0629 05/09/18 0758 05/10/18 0343 05/11/18 0314  NA 138 135 136 136  K 3.2* 3.2* 2.9* 3.6  CL 102 98 91* 93*  CO2 26 28 29 28   GLUCOSE 107* 103* 124* 90  BUN 11 9 10 12   CREATININE 0.75 0.78 0.84 0.84  CALCIUM 7.9* 8.0* 8.1* 8.3*   GFR: Estimated Creatinine Clearance: 87.3 mL/min (by C-G formula based on SCr of 0.84 mg/dL). Liver Function Tests: Recent Labs  Lab 05/08/18 1237 05/09/18 0758 05/11/18 0314  AST 30 28 28   ALT 25 25 24   ALKPHOS 144* 145* 155*  BILITOT 3.6* 4.1* 3.6*  PROT 5.5* 5.3* 5.2*  ALBUMIN 2.7* 2.7* 2.6*   No results for input(s): LIPASE, AMYLASE in the last 168 hours. No results for input(s): AMMONIA in the last 168 hours. Coagulation Profile: No results for input(s): INR, PROTIME in the last 168 hours. Cardiac Enzymes: No results for input(s): CKTOTAL, CKMB, CKMBINDEX, TROPONINI in the last 168 hours. BNP (last 3 results) No results for input(s): PROBNP in the last 8760 hours. HbA1C: No results for input(s): HGBA1C in the last 72 hours. CBG: Recent Labs  Lab 05/10/18 1248 05/10/18 1634 05/10/18 2138 05/11/18 0751 05/11/18 1123  GLUCAP 86 96 86 98 100*   Lipid Profile: No results for input(s): CHOL, HDL, LDLCALC, TRIG, CHOLHDL, LDLDIRECT in the last 72 hours. Thyroid Function Tests: No results for input(s): TSH, T4TOTAL, FREET4, T3FREE, THYROIDAB in the last 72 hours. Anemia Panel: No results for input(s): VITAMINB12, FOLATE, FERRITIN, TIBC, IRON, RETICCTPCT in the last 72 hours. Sepsis Labs: No results for input(s): PROCALCITON, LATICACIDVEN in the last 168 hours.  Recent Results (from the past 240 hour(s))  MRSA PCR Screening     Status: None    Collection Time: 05/04/18  6:37 AM  Result Value Ref Range Status   MRSA by PCR NEGATIVE NEGATIVE Final    Comment:        The GeneXpert MRSA Assay (FDA approved for NASAL specimens only), is one component of a comprehensive MRSA colonization surveillance program. It is not intended to diagnose MRSA infection nor to guide or monitor treatment for MRSA infections. Performed at [facility], [place] [address] [place], [place] [address]   SARS Coronavirus 2 (CEPHEID- Performed in [place] hospital lab), Hosp Order     Status: None   Collection Time: 05/08/18 11:30 AM  Result Value Ref Range Status   SARS Coronavirus 2 NEGATIVE NEGATIVE Final    Comment: (NOTE) If result is NEGATIVE SARS-CoV-2 target nucleic acids are NOT DETECTED. The SARS-CoV-2 RNA is generally detectable in upper and lower  respiratory specimens during the acute phase of infection. The lowest  concentration of SARS-CoV-2 viral copies this assay can detect is 250  copies / mL. A negative  result does not preclude SARS-CoV-2 infection  and should not be used as the sole basis for treatment or other  patient management decisions.  A negative result may occur with  improper specimen collection / handling, submission of specimen other  than nasopharyngeal swab, presence of viral mutation(s) within the  areas targeted by this assay, and inadequate number of viral copies  (<250 copies / mL). A negative result must be combined with clinical  observations, patient history, and epidemiological information. If result is POSITIVE SARS-CoV-2 target nucleic acids are DETECTED. The SARS-CoV-2 RNA is generally detectable in upper and lower  respiratory specimens dur ing the acute phase of infection.  Positive  results are indicative of active infection with SARS-CoV-2.  Clinical  correlation with patient history and other diagnostic information is  necessary to determine patient infection status.  Positive results do   not rule out bacterial infection or co-infection with other viruses. If result is PRESUMPTIVE POSTIVE SARS-CoV-2 nucleic acids MAY BE PRESENT.   A presumptive positive result was obtained on the submitted specimen  and confirmed on repeat testing.  While 2019 novel coronavirus  (SARS-CoV-2) nucleic acids may be present in the submitted sample  additional confirmatory testing may be necessary for epidemiological  and / or clinical management purposes  to differentiate between  SARS-CoV-2 and other Sarbecovirus currently known to infect humans.  If clinically indicated additional testing with an alternate test  methodology [phone]) is advised. The SARS-CoV-2 RNA is generally  detectable in upper and lower respiratory sp ecimens during the acute  phase of infection. The expected result is Negative. Fact Sheet for Patients:  StrictlyIdeas.no Fact Sheet for Healthcare Providers: BankingDealers.co.za This test is not yet approved or cleared by the [place] FDA and has been authorized for detection and/or diagnosis of SARS-CoV-2 by FDA under an Emergency Use Authorization (EUA).  This EUA will remain in effect (meaning this test can be used) for the duration of the COVID-19 declaration under Section 564(b)(1) of the Act, 21 U.S.C. section 360bbb-3(b)(1), unless the authorization is terminated or revoked sooner. Performed at [facility], [place] [address], [place], [place] [address]          Radiology Studies: No results found.      Scheduled Meds: . sodium chloride   Intravenous Once  . bacitracin   Topical BID  . chlorhexidine  15 mL Mouth Rinse BID  . folic acid  1 mg Intravenous Daily  . mouth rinse  15 mL Mouth Rinse q12n4p  . nicotine  14 mg Transdermal Daily  . pravastatin  20 mg Oral QHS  . pregabalin  100 mg Oral QHS  . QUEtiapine  12.5 mg Per Tube QHS  . thiamine injection  100 mg Intravenous Daily    Continuous Infusions: . dextrose 5 % with kcl 10 mL/hr at 05/10/18 0800     LOS: 7 days    Time spent: 35 minutes.     Hosie Poisson, MD [facility] Pager [phone]  If 7PM-7AM, please contact night-coverage [email or web address] Password [email or web address] 05/11/2018, 4:12 PM

## 2018-05-11 NOTE — Progress Notes (Signed)
SLP Cancellation Note  Patient Details Name: [name] MRN: [id number] DOB: [birth year]   Cancelled treatment:        Pt lying diagonal in bed, gown part way off, eyes closed pulling at [place]osey belt. No response to SLP in attempts to reposition. He received Haldol earlier today per RN. Not appropriate to try po's presently. Discussed with nurse possibility of a sitter to help redirect versus giving sedating meds (unless he is danger to self or others). Meds stay in system and prolongs ability to wake and participate fully. He is NPO, no alternative support. Will continue efforts.    [name] 05/11/2018, 3:53 PM   Orbie Pyo Colvin Caroli.Ed Risk analyst [phone] Office [phone]

## 2018-05-12 ENCOUNTER — Inpatient Hospital Stay (HOSPITAL_COMMUNITY): Payer: Medicare Other

## 2018-05-12 DIAGNOSIS — K7460 Unspecified cirrhosis of liver: Secondary | ICD-10-CM

## 2018-05-12 DIAGNOSIS — Z7189 Other specified counseling: Secondary | ICD-10-CM

## 2018-05-12 DIAGNOSIS — D696 Thrombocytopenia, unspecified: Secondary | ICD-10-CM

## 2018-05-12 DIAGNOSIS — I609 Nontraumatic subarachnoid hemorrhage, unspecified: Secondary | ICD-10-CM

## 2018-05-12 DIAGNOSIS — F10929 Alcohol use, unspecified with intoxication, unspecified: Secondary | ICD-10-CM

## 2018-05-12 DIAGNOSIS — Z515 Encounter for palliative care: Secondary | ICD-10-CM

## 2018-05-12 DIAGNOSIS — R451 Restlessness and agitation: Secondary | ICD-10-CM

## 2018-05-12 LAB — BASIC METABOLIC PANEL
Anion gap: 11 (ref 5–15)
BUN: 12 mg/dL (ref 8–23)
CO2: 29 mmol/L (ref 22–32)
Calcium: 8.6 mg/dL — ABNORMAL LOW (ref 8.9–10.3)
Chloride: 96 mmol/L — ABNORMAL LOW (ref 98–111)
Creatinine, Ser: 0.69 mg/dL (ref 0.61–1.24)
GFR calc Af Amer: >60 mL/min (ref >60)
GFR calc non Af Amer: >60 mL/min (ref >60)
Glucose, Bld: 113 mg/dL — ABNORMAL HIGH (ref 70–99)
Potassium: 3.6 mmol/L (ref 3.5–5.1)
Sodium: 136 mmol/L (ref 135–145)

## 2018-05-12 LAB — GLUCOSE, CAPILLARY
Glucose-Capillary: 94 mg/dL (ref 70–99)
Glucose-Capillary: 120 mg/dL — ABNORMAL HIGH (ref 70–99)
Glucose-Capillary: 92 mg/dL (ref 70–99)
Glucose-Capillary: 92 mg/dL (ref 70–99)

## 2018-05-12 MED ORDER — SODIUM CHLORIDE 0.9 % IV SOLN
INTRAVENOUS | Status: DC
  Administered 2018-05-12 – 2018-05-13 (×2): via INTRAVENOUS

## 2018-05-12 NOTE — Progress Notes (Signed)
SLP Cancellation Note  Patient Details Name: ZEUS MARQUIS MRN: [id number] DOB: [birth year]   Cancelled treatment:       Reason Eval/Treat Not Completed: Patient's level of consciousness. Lethargy continues to prohibit assessment. Will follow   Cartina Brousseau, Katherene Ponto 05/12/2018, 1:31 PM

## 2018-05-12 NOTE — Progress Notes (Signed)
Occupational Therapy Treatment Patient Details Name: [name] MRN: [id number] DOB: [birth year] Today's Date: 05/12/2018    History of present illness SRICHARAN LACOMB is a 73 y.o. male With history of diabetes, hyperlipidemia, hypertension, prostate cancer, cirrhosis who was brought to ED after a fall. By report he was drinking with a friend when he fell forward. Patient is amnestic to events surrounding the fall. He underwent CT head/C spine which is significant for an acute right SDH and C2 fracture (managed with Aspen collar).   OT comments  Pt continues to have limitations due to level of arousal/lethargy (suspect largely due to meds). Pt minimally arouses to therapists with attempts to arouse and during mobility, moving extremities but non-purposeful. Pt does respond to noxious stimuli but does not follow any commands during session. Requires totalA for pericare start of session at bed level and total hand over hand assist to perform simple grooming ADL. Use of Stedy for transfer OOB to recliner and pt with productive cough once seated upright. VSS throughout session on 3L O2 and BP 137/77 end of session. Feel POC remains appropriate at this time. Will continue to follow acutely.   Follow Up Recommendations  SNF;Supervision/Assistance - 24 hour    Equipment Recommendations  Other (comment)(TBA)          Precautions / Restrictions Precautions Precautions: Fall;Cervical Precaution Booklet Issued: No Precaution Comments: belt restraint and B posey mitts Required Braces or Orthoses: Cervical Brace Cervical Brace: Hard collar;At all times Restrictions Weight Bearing Restrictions: No       Mobility Bed Mobility Overal bed mobility: Needs Assistance Bed Mobility: Rolling;Sidelying to Sit Rolling: Total assist;+2 for safety/equipment Sidelying to sit: Total assist;+2 for physical assistance       General bed mobility comments: Pt able to move legs voluntarily but does  not move to commands with hand over hand placement he does grip railing.  He requires total +2 for all aspects to advance LEs and elevate trunk into sitting.  Pt is slow and not responsive to commands.  Once seated edge of bed presents with posterior lean and pulling knees to chest.  Required assistance to froward weight shift and ground feet to floor.    Transfers Overall transfer level: Needs assistance Equipment used: Ambulation equipment used Transfers: Sit to/from Stand Sit to Stand: Total assist;+2 physical assistance;+2 safety/equipment         General transfer comment: Pt continues to perform partial 3/4 stand with flexed hips/knees and trunk.  He was able to stand in the position for placement of sara stedy plates to transfer OOB to chair.  Once seated in recliner presents with productive cough.      Balance Overall balance assessment: Needs assistance Sitting-balance support: No upper extremity supported;Feet supported Sitting balance-Leahy Scale: Zero Sitting balance - Comments: posterior lean with absent B LE support pulling knees to chest   Standing balance support: Bilateral upper extremity supported Standing balance-Leahy Scale: Poor Standing balance comment: posterior lean                           ADL either performed or assessed with clinical judgement   ADL Overall ADL's : Needs assistance/impaired     Grooming: Total assistance;Wash/dry face;Sitting;Brushing hair Grooming Details (indicate cue type and reason): trialed hand over hand assist for simple grooming task; pt unable to participate due to level of lethargy  General ADL Comments: requires totalA at this time due to decreased level of arousal     Vision       Perception     Praxis      Cognition Arousal/Alertness: Lethargic;Suspect due to medications(continues to be on haldol which is limiting functional gains) Behavior During Therapy: Flat  affect Overall Cognitive Status: Difficult to assess                                 General Comments: pt very lethergic this session; minimally responsive to therapists at start and end of session but overall unable to fully arouse even with mobility attemps        Exercises     Shoulder Instructions       General Comments VSS with BP137/77 end of session    Pertinent Vitals/ Pain       Pain Assessment: Faces Faces Pain Scale: No hurt(responds to noxious stimuli but otherwise no signs of pain) Pain Intervention(s): Monitored during session  Home Living                                          Prior Functioning/Environment              Frequency  Min 2X/week        Progress Toward Goals  OT Goals(current goals can now be found in the care plan section)  Progress towards OT goals: Not progressing toward goals - comment(due to level of arousal)  Acute Rehab OT Goals Patient Stated Goal: none stated OT Goal Formulation: With patient Time For Goal Achievement: 05/20/18 Potential to Achieve Goals: Good  Plan Discharge plan remains appropriate    Co-evaluation    PT/OT/SLP Co-Evaluation/Treatment: Yes Reason for Co-Treatment: Complexity of the patient's impairments (multi-system involvement);For patient/therapist safety PT goals addressed during session: Mobility/safety with mobility OT goals addressed during session: ADL's and self-care      AM-PAC OT "6 Clicks" Daily Activity     Outcome Measure   Help from another person eating meals?: Total Help from another person taking care of personal grooming?: Total Help from another person toileting, which includes using toliet, bedpan, or urinal?: Total Help from another person bathing (including washing, rinsing, drying)?: Total Help from another person to put on and taking off regular upper body clothing?: Total Help from another person to put on and taking off regular lower  body clothing?: Total 6 Click Score: 6    End of Session Equipment Utilized During Treatment: Oxygen;Cervical collar  OT Visit Diagnosis: Unsteadiness on feet (R26.81);Other abnormalities of gait and mobility (R26.89);Muscle weakness (generalized) (M62.81);History of falling (Z91.81);Other symptoms and signs involving cognitive function   Activity Tolerance Patient limited by lethargy   Patient Left in chair;with call bell/phone within reach;with chair alarm set;with restraints reapplied   Nurse Communication Mobility status        Time: 2025-4270 OT Time Calculation (min): 31 min  Charges: OT General Charges $OT Visit: 1 Visit OT Treatments $Self Care/Home Management : 8-22 mins  Lou Cal, [place] Pager [phone] Office [place] 05/12/2018, 11:08 AM

## 2018-05-12 NOTE — Progress Notes (Signed)
PROGRESS NOTE    [name]  ACZ:[id number] DOB: [birth year] DOA: 05/04/2018 PCP: Celene Squibb, MD    Brief Narrative:   [name] is a 73 year old male with history of metastatic prostate cancer, Liver cirrhosis, diabetes mellitus, hypertension, alcohol abuse, tobacco abuse was admitted to neurosurgery on 4/29 with right intraparenchymal hematoma, subdural hematoma and a C2 fracture. -He had been awaiting SNF placement, through his hospitalization he was noted to have increased confusion and agitation as well as respiratory distress. -TRH was consulted 5/3 due to increased agitation and respiratory distres -Also noted to have hypoglycemia, pulmonary edema and pleural effusions 5/4: TRH took over from [place] service   Assessment & Plan:   Principal Problem:   Subdural hematoma (HCC) Active Problems:   Diabetes mellitus type 2 in nonobese [facility])   Essential hypertension   Hepatic cirrhosis (HCC)   Prostate cancer ([place])   Respiratory distress   Alcohol abuse   Palliative care by specialist   Goals of care, counseling/discussion   Alcoholic intoxication with complication ([place])   Thrombocytopenia ([place])   SAH (subarachnoid hemorrhage) (HCC)   Agitation  Right intraparenchymal hematoma Extensive right subdural hematoma and possible intraventricular hemorrhage with traumatic C2 fracture following a fall. Patient was initially admitted by neurosurgery. Nonoperative management recommended by neurosurgery. Patient is currently on c-collar. Neurosurgery recommends patient is stable from their standpoint and transferred patient to [facility] service. No new acute issues.     Acute encephalopathy Differentials include alcohol withdrawal versus hospital delirium. Patient is currently on CIWA protocol. Repeat CT head is negative for acute pathology. Patient continues to be somnolent and and agitated at times requiring wrist restraints. Low-dose Seroquel was added at bedtime.  No  improvement in patient's mental status in the the last 72 hours. Unfortunately, unable tot ake any meds safely via oral.  Core track to be placed and start tube feeds and palliative care consulted for goals of care and symptom management. [place] talks in progress.    Fluid overload secondary to liver cirrhosis and hypoalbuminemia Pt on 4 lit of [place] . Will repeat CXR today  Patient is very euvolemic. COVID PCR is negative   Suspected COPD No wheezing heard    Diabetes mellitus CBG (last 3)  Recent Labs    05/11/18 2118 05/12/18 0709 05/12/18 1133  GLUCAP 97 94 120*   Continue with sliding scale insulin while inpatient     hypertension Well-controlled    Metastatic prostate cancer S/p seed implants       DVT prophylaxis: SCD'S Code Status: DNR Family Communication: none at bedside. Palliative care spoke to the patient's family and updated.  Disposition Plan:  Pending clinical improvement.    Consultants:   Neuro surgery. Dr Cato Mulligan   Procedures: none.    Antimicrobials: none.    Subjective: Somnolent and withdrawing to painful stimuli. No new changes from yesterday.  Awaiting for cortrack placement which will happen tomorrow .   Objective: Vitals:   05/11/18 2300 05/12/18 0354 05/12/18 0747 05/12/18 1153  BP:  (!) 143/76 (!) 149/87 (!) 143/98  Pulse:   97 (!) 43  Resp: (!) 22  18 16   Temp: (!) 97.5 F (36.4 C) 97.6 F (36.4 C) 98.3 F (36.8 C) (!) 97.5 F (36.4 C)  TempSrc: Oral Oral Oral Oral  SpO2:   98% 96%  Weight:      Height:        Intake/Output Summary (Last 24 hours) at 05/12/2018 1414 Last data filed at  05/12/2018 0355 Gross per 24 hour  Intake -  Output 1000 ml  Net -1000 ml   Filed Weights   05/08/18 0900 05/09/18 2300  Weight: 88.2 kg 88.2 kg    Examination:  General exam:  Somnolent , only responding to painful stimuli.  Respiratory system: Clear to auscultation. Respiratory effort normal. Cardiovascular system: S1 &  S2 heard, RRR. Marland Kitchen No pedal edema. Gastrointestinal system: Abdomen is nondistended, soft . Normal bowel sounds heard. Central nervous system: pupils reactive,  Not responding to verbal cues, only responds to sternal rub with groans. Able to move his lower extremities.  Extremities: no pedal edema.  Skin: No rashes, lesions or ulcers Psychiatry: cannot be assessed.      Data Reviewed: I have personally reviewed following labs and imaging studies  CBC: Recent Labs  Lab 05/08/18 1237 05/09/18 0758 05/10/18 0343 05/11/18 0314  WBC 3.3* 2.8* 3.6* 3.5*  NEUTROABS 2.4  --   --   --   HGB 13.1 13.1 14.4 13.8  HCT 36.3* 35.5* 38.4* 37.2*  MCV 93.3 91.3 90.6 90.7  PLT 59* 53* 56* 62*   Basic Metabolic Panel: Recent Labs  Lab 05/08/18 0629 05/09/18 0758 05/10/18 0343 05/11/18 0314 05/12/18 0912  NA 138 135 136 136 136  K 3.2* 3.2* 2.9* 3.6 3.6  CL 102 98 91* 93* 96*  CO2 26 28 29 28 29   GLUCOSE 107* 103* 124* 90 113*  BUN 11 9 10 12 12   CREATININE 0.75 0.78 0.84 0.84 0.69  CALCIUM 7.9* 8.0* 8.1* 8.3* 8.6*   GFR: Estimated Creatinine Clearance: 91.7 mL/min (by C-G formula based on SCr of 0.69 mg/dL). Liver Function Tests: Recent Labs  Lab 05/08/18 1237 05/09/18 0758 05/11/18 0314  AST 30 28 28   ALT 25 25 24   ALKPHOS 144* 145* 155*  BILITOT 3.6* 4.1* 3.6*  PROT 5.5* 5.3* 5.2*  ALBUMIN 2.7* 2.7* 2.6*   No results for input(s): LIPASE, AMYLASE in the last 168 hours. No results for input(s): AMMONIA in the last 168 hours. Coagulation Profile: No results for input(s): INR, PROTIME in the last 168 hours. Cardiac Enzymes: No results for input(s): CKTOTAL, CKMB, CKMBINDEX, TROPONINI in the last 168 hours. BNP (last 3 results) No results for input(s): PROBNP in the last 8760 hours. HbA1C: No results for input(s): HGBA1C in the last 72 hours. CBG: Recent Labs  Lab 05/11/18 1123 05/11/18 1638 05/11/18 2118 05/12/18 0709 05/12/18 1133  GLUCAP 100* 105* 97 94 120*    Lipid Profile: No results for input(s): CHOL, HDL, LDLCALC, TRIG, CHOLHDL, LDLDIRECT in the last 72 hours. Thyroid Function Tests: No results for input(s): TSH, T4TOTAL, FREET4, T3FREE, THYROIDAB in the last 72 hours. Anemia Panel: No results for input(s): VITAMINB12, FOLATE, FERRITIN, TIBC, IRON, RETICCTPCT in the last 72 hours. Sepsis Labs: No results for input(s): PROCALCITON, LATICACIDVEN in the last 168 hours.  Recent Results (from the past 240 hour(s))  MRSA PCR Screening     Status: None   Collection Time: 05/04/18  6:37 AM  Result Value Ref Range Status   MRSA by PCR NEGATIVE NEGATIVE Final    Comment:        The GeneXpert MRSA Assay (FDA approved for NASAL specimens only), is one component of a comprehensive MRSA colonization surveillance program. It is not intended to diagnose MRSA infection nor to guide or monitor treatment for MRSA infections. Performed at [facility], [place] [address] [place], [place] [address]   SARS Coronavirus 2 (CEPHEID- Performed  in [place] lab), Hosp Order     Status: None   Collection Time: 05/08/18 11:30 AM  Result Value Ref Range Status   SARS Coronavirus 2 NEGATIVE NEGATIVE Final    Comment: (NOTE) If result is NEGATIVE SARS-CoV-2 target nucleic acids are NOT DETECTED. The SARS-CoV-2 RNA is generally detectable in upper and lower  respiratory specimens during the acute phase of infection. The lowest  concentration of SARS-CoV-2 viral copies this assay can detect is 250  copies / mL. A negative result does not preclude SARS-CoV-2 infection  and should not be used as the sole basis for treatment or other  patient management decisions.  A negative result may occur with  improper specimen collection / handling, submission of specimen other  than nasopharyngeal swab, presence of viral mutation(s) within the  areas targeted by this assay, and inadequate number of viral copies  (<250 copies / mL). A negative result must be  combined with clinical  observations, patient history, and epidemiological information. If result is POSITIVE SARS-CoV-2 target nucleic acids are DETECTED. The SARS-CoV-2 RNA is generally detectable in upper and lower  respiratory specimens dur ing the acute phase of infection.  Positive  results are indicative of active infection with SARS-CoV-2.  Clinical  correlation with patient history and other diagnostic information is  necessary to determine patient infection status.  Positive results do  not rule out bacterial infection or co-infection with other viruses. If result is PRESUMPTIVE POSTIVE SARS-CoV-2 nucleic acids MAY BE PRESENT.   A presumptive positive result was obtained on the submitted specimen  and confirmed on repeat testing.  While 2019 novel coronavirus  (SARS-CoV-2) nucleic acids may be present in the submitted sample  additional confirmatory testing may be necessary for epidemiological  and / or clinical management purposes  to differentiate between  SARS-CoV-2 and other Sarbecovirus currently known to infect humans.  If clinically indicated additional testing with an alternate test  methodology [phone]) is advised. The SARS-CoV-2 RNA is generally  detectable in upper and lower respiratory sp ecimens during the acute  phase of infection. The expected result is Negative. Fact Sheet for Patients:  StrictlyIdeas.no Fact Sheet for Healthcare Providers: BankingDealers.co.za This test is not yet approved or cleared by the [place] FDA and has been authorized for detection and/or diagnosis of SARS-CoV-2 by FDA under an Emergency Use Authorization (EUA).  This EUA will remain in effect (meaning this test can be used) for the duration of the COVID-19 declaration under Section 564(b)(1) of the Act, 21 U.S.C. section 360bbb-3(b)(1), unless the authorization is terminated or revoked sooner. Performed at [place], [place] [address], [place], [place] [address]          Radiology Studies: No results found.      Scheduled Meds: . sodium chloride   Intravenous Once  . bacitracin   Topical BID  . chlorhexidine  15 mL Mouth Rinse BID  . folic acid  1 mg Intravenous Daily  . mouth rinse  15 mL Mouth Rinse q12n4p  . nicotine  14 mg Transdermal Daily  . pravastatin  20 mg Oral QHS  . pregabalin  100 mg Oral QHS  . QUEtiapine  12.5 mg Per Tube QHS  . thiamine injection  100 mg Intravenous Daily   Continuous Infusions: . sodium chloride 75 mL/hr at 05/12/18 1053  . dextrose 5 % with kcl 10 mL/hr at 05/10/18 0800     LOS: 8 days    Time spent:  28 minutes.     Hosie Poisson, MD [facility] Pager [phone]  If 7PM-7AM, please contact night-coverage [email or web address] Password [email or web address] 05/12/2018, 2:14 PM

## 2018-05-12 NOTE — Care Management Important Message (Signed)
Important Message  Patient Details  Name: [name] MRN: [id number] Date of Birth: [birth year]   Medicare Important Message Given:  Yes    Orbie Pyo 05/12/2018, 2:26 PM

## 2018-05-12 NOTE — Progress Notes (Signed)
Physical Therapy Treatment Patient Details Name: [name] MRN: [id number] DOB: 1945/11/13 Today's Date: 05/12/2018    History of Present Illness [name] is a 73 y.o. male With history of diabetes, hyperlipidemia, hypertension, prostate cancer, cirrhosis who was brought to ED after a fall. By report he was drinking with a friend when he fell forward. Patient is amnestic to events surrounding the fall. He underwent CT head/C spine which is significant for an acute right SDH and C2 fracture (managed with Aspen collar).    PT Comments    Pt performed bed mobs, and sit to stand transfer in sara stedy frame.  He was able to complete all transfers with total +2 assistance as he remains lethargic from medications.  Pt continues to benefit from SNF placement to improve strength and function before returning home.  Pt not following commands today but does respond to noxious stimuli.  BP at 8:00 am 162/71, BP take post tx 137/77 seated in recliner take at 0925am.     Follow Up Recommendations  SNF;Supervision/Assistance - 24 hour     Equipment Recommendations  Other (comment)(tbd)    Recommendations for Other Services       Precautions / Restrictions Precautions Precautions: Fall;Cervical Precaution Booklet Issued: No Precaution Comments: belt restraint and B posey mitts Required Braces or Orthoses: Cervical Brace Cervical Brace: Hard collar;At all times Restrictions Weight Bearing Restrictions: No    Mobility  Bed Mobility Overal bed mobility: Needs Assistance Bed Mobility: Rolling;Sidelying to Sit Rolling: Total assist;+2 for safety/equipment Sidelying to sit: Total assist;+2 for physical assistance       General bed mobility comments: Pt able to move legs involuntarily but does not move to commands with hand over hand placement he does grip railing.  He requires total +2 for all aspects to advance LEs and elevate trunk into sitting.  Pt is slow and not  responsive to commands.  Once seated edge of bed presents with posterior lean and pulling knees to chest.  Required assistance to froward weight shift and ground feet to floor.    Transfers Overall transfer level: Needs assistance Equipment used: Ambulation equipment used(sara stedy) Transfers: Sit to/from Stand Sit to Stand: Total assist;+2 physical assistance;+2 safety/equipment         General transfer comment: Pt continues to perform partial 3/4 stand with flexed hips/knees and trunk.  He was able to stand in the position for placement of sara stedy plates to transfer OOB to chair.  Once seated in recliner presents with productive cough.    Ambulation/Gait Ambulation/Gait assistance: (NT unable to awaken long enough, and unable to stand in stedy for progression of gait training.  )               Stairs             Wheelchair Mobility    Modified Rankin (Stroke Patients Only)       Balance Overall balance assessment: Needs assistance   Sitting balance-Leahy Scale: Zero Sitting balance - Comments: posterior lean with absent B LE support pulling knees to chest     Standing balance-Leahy Scale: Poor Standing balance comment: posterior lean                            Cognition Arousal/Alertness: Lethargic;Suspect due to medications(continues to be on haldol which is limiting functional gains) Behavior During Therapy: Flat affect Overall Cognitive Status: Difficult to assess  General Comments: pt very lethergic this session; minimally responsive to therapists at start and end of session but overall unable to fully arouse even with mobility attemps      Exercises      General Comments        Pertinent Vitals/Pain Pain Assessment: No/denies pain(responds to noxious stimuli but does not appear to be in pain.  )    Home Living                      Prior Function            PT Goals  (current goals can now be found in the care plan section) Acute Rehab PT Goals Patient Stated Goal: none stated Potential to Achieve Goals: Fair Progress towards PT goals: Progressing toward goals    Frequency    Min 3X/week      PT Plan Current plan remains appropriate    Co-evaluation PT/OT/SLP Co-Evaluation/Treatment: Yes Reason for Co-Treatment: Complexity of the patient's impairments (multi-system involvement);For patient/therapist safety PT goals addressed during session: Mobility/safety with mobility OT goals addressed during session: ADL's and self-care      AM-PAC PT "6 Clicks" Mobility   Outcome Measure  Help needed turning from your back to your side while in a flat bed without using bedrails?: Total Help needed moving from lying on your back to sitting on the side of a flat bed without using bedrails?: Total Help needed moving to and from a bed to a chair (including a wheelchair)?: Total Help needed standing up from a chair using your arms (e.g., wheelchair or bedside chair)?: Total Help needed to walk in hospital room?: Total Help needed climbing 3-5 steps with a railing? : Total 6 Click Score: 6    End of Session Equipment Utilized During Treatment: Cervical collar Activity Tolerance: Patient limited by fatigue;Patient limited by lethargy(appears to be overmedicated) Patient left: in bed;with call bell/phone within reach;with bed alarm set;with restraints reapplied Nurse Communication: Mobility status PT Visit Diagnosis: Unsteadiness on feet (R26.81);Other abnormalities of gait and mobility (R26.89);History of falling (Z91.81);Muscle weakness (generalized) (M62.81);Difficulty in walking, not elsewhere classified (R26.2)     Time: 4097-3532 PT Time Calculation (min) (ACUTE ONLY): 32 min  Charges:  $Therapeutic Activity: 8-22 mins                     Governor Rooks, PTA Acute Rehabilitation Services Pager [phone] Office [place] 05/12/2018, 9:47 AM

## 2018-05-13 LAB — GLUCOSE, CAPILLARY
Glucose-Capillary: 79 mg/dL (ref 70–99)
Glucose-Capillary: 79 mg/dL (ref 70–99)
Glucose-Capillary: 75 mg/dL (ref 70–99)
Glucose-Capillary: 89 mg/dL (ref 70–99)

## 2018-05-13 MED ORDER — VITAMIN B-1 100 MG PO TABS
100.0000 mg | ORAL_TABLET | Freq: Every day | ORAL | Status: DC
  Administered 2018-05-14 – 2018-05-23 (×10): 100 mg
  Filled 2018-05-13 (×10): qty 1

## 2018-05-13 MED ORDER — JEVITY 1.2 CAL PO LIQD: 1000.0000 mL | ORAL | Status: DC

## 2018-05-13 MED ORDER — PRO-STAT SUGAR FREE PO LIQD
30.0000 mL | Freq: Every day | ORAL | Status: DC
  Administered 2018-05-13 – 2018-05-23 (×11): 30 mL
  Filled 2018-05-13 (×10): qty 30

## 2018-05-13 MED ORDER — FOLIC ACID 1 MG PO TABS
1.0000 mg | ORAL_TABLET | Freq: Every day | ORAL | Status: DC
  Administered 2018-05-14 – 2018-05-23 (×10): 1 mg
  Filled 2018-05-13 (×10): qty 1

## 2018-05-13 MED ORDER — OSMOLITE 1.5 CAL PO LIQD
1000.0000 mL | ORAL | Status: DC
  Administered 2018-05-13 – 2018-05-21 (×9): 1000 mL
  Filled 2018-05-13 (×13): qty 1000

## 2018-05-13 MED ORDER — LORAZEPAM 2 MG/ML IJ SOLN
1.0000 mg | Freq: Once | INTRAMUSCULAR | Status: AC
  Administered 2018-05-13: 1 mg via INTRAVENOUS

## 2018-05-13 MED ORDER — PRAVASTATIN SODIUM 10 MG PO TABS
20.0000 mg | ORAL_TABLET | Freq: Every day | ORAL | Status: DC
  Administered 2018-05-13 – 2018-05-22 (×10): 20 mg
  Filled 2018-05-13 (×10): qty 2

## 2018-05-13 NOTE — Progress Notes (Signed)
Daily Progress Note   Patient Name: [name]       Date: 05/13/2018 DOB: 08-16-1945  Age: 73 y.o. MRN#: [id number] Attending Physician: Hosie Poisson, MD Primary Care Physician: Celene Squibb, MD Admit Date: 05/04/2018  Reason for Consultation/Follow-up: Establishing goals of care  Subjective: Opens eyes to sternal rub. Remains drowsy. Recently given 1x dose ativan. Patient can tell me his name is "[name]" and squeezes hands with assist and encouragement. No s/s of pain or discomfort.   Length of Stay: 9  Current Medications: Scheduled Meds:  . sodium chloride   Intravenous Once  . bacitracin   Topical BID  . chlorhexidine  15 mL Mouth Rinse BID  . folic acid  1 mg Intravenous Daily  . LORazepam  1-2 mg Intravenous Once  . mouth rinse  15 mL Mouth Rinse q12n4p  . nicotine  14 mg Transdermal Daily  . pravastatin  20 mg Oral QHS  . QUEtiapine  12.5 mg Per Tube QHS  . thiamine injection  100 mg Intravenous Daily    Continuous Infusions: . sodium chloride 75 mL/hr at 05/13/18 0400  . dextrose 5 % with kcl Stopped (05/10/18 1126)    PRN Meds: acetaminophen **OR** acetaminophen, bisacodyl, calcium carbonate, haloperidol lactate, hydrALAZINE, insulin aspart, loratadine, ondansetron **OR** ondansetron (ZOFRAN) IV, promethazine, sodium chloride flush, sodium phosphate  Physical Exam Vitals signs and nursing note reviewed.  Constitutional:      Appearance: He is ill-appearing.  HENT:     Head: Normocephalic and atraumatic.  Cardiovascular:     Rate and Rhythm: Normal rate.  Pulmonary:     Effort: No tachypnea, accessory muscle usage or respiratory distress.  Abdominal:     Tenderness: There is no abdominal tenderness.  Skin:    General: Skin is warm and dry.  Neurological:      Mental Status: He is lethargic.     Comments: Opens eyes intermittently to voice. Tells me his name is [name]. Following few commands with frequent request.  Psychiatric:        Attention and Perception: He is inattentive.        Speech: Speech is delayed.        Cognition and Memory: Cognition is impaired.            Vital Signs: BP 137/83 (BP  Location: Right Arm)   Pulse 93   Temp 97.7 F (36.5 C) (Oral)   Resp 17   Ht 6' 0.52" (1.842 m)   Wt 88.2 kg   SpO2 98%   BMI 25.99 kg/m  SpO2: SpO2: 98 % O2 Device: O2 Device: Nasal Cannula O2 Flow Rate: O2 Flow Rate (L/min): 2 L/min  Intake/output summary:   Intake/Output Summary (Last 24 hours) at 05/13/2018 1125 Last data filed at 05/13/2018 0400 Gross per 24 hour  Intake 1291.69 ml  Output 275 ml  Net 1016.69 ml   LBM: Last BM Date: 05/12/18 Baseline Weight: Weight: 88.2 kg Most recent weight: Weight: 88.2 kg       Palliative Assessment/Data: PPS 30%    Flowsheet Rows     Most Recent Value  Intake Tab  Referral Department  Hospitalist  Unit at Time of Referral  Intermediate Care Unit  Palliative Care Primary Diagnosis  Neurology  Palliative Care Type  New Palliative care  Date first seen by Palliative Care  05/11/18  Clinical Assessment  Palliative Performance Scale Score  30%  Psychosocial & Spiritual Assessment  Palliative Care Outcomes  Patient/Family meeting held?  Yes  Who was at the meeting?  niece via telephone  Palliative Care Outcomes  Clarified goals of care, Provided end of life care assistance, Provided psychosocial or spiritual support, ACP counseling assistance      Patient Active Problem List   Diagnosis Date Noted  . Palliative care by specialist   . Goals of care, counseling/discussion   . Alcoholic intoxication with complication ([place])   . Thrombocytopenia ([place])   . SAH (subarachnoid hemorrhage) ([place])   . Agitation   . Respiratory distress 05/08/2018  . Alcohol abuse 05/08/2018  .  Subdural hematoma ([place]) 05/04/2018  . Prostate cancer ([place]) 04/16/2017  . Malignant neoplasm of prostate ([place]) 01/07/2017  . Hepatic cirrhosis ([place]) 08/17/2013  . Diverticulosis 07/19/2013  . DIARRHEA 08/22/2008  . COLITIS 08/21/2008  . DIVERTICULITIS, COLON 08/21/2008  . Diabetes mellitus type 2 in nonobese ([place]) 08/20/2008  . HYPERLIPIDEMIA 08/20/2008  . SMOKER 08/20/2008  . Essential hypertension 08/20/2008  . EXTERNAL HEMORRHOIDS 08/20/2008  . COLONIC POLYPS, ADENOMATOUS, HX OF 08/20/2008    Palliative Care Assessment & Plan   Patient Profile: 73 y.o. male  with past medical history of DM, HLD, HTN, liver cirrhosis, prostate cancer, smoker, ETOH use admitted on 05/04/2018 after fall with alcohol intoxication. Initially admitted to neurosurgery with right intraparenchymal hematoma, subdural hematoma and C2 fracture. TRH consulted on 5/3 due to increased agitation and respiratory distress, felt related to alcohol withdrawal. Ativan initiated. Also noted to have hypoglycemia, pulmonary edema and pleural effusions. Repeat CT head 5/4 stable. Ongoing encephalopathy and agitation. Unable to work with SLP. Cortrak to be placed 5/6. Palliative medicine consultation for goals of care.   Assessment: Fall Right intraparenchymal hematoma Right subdural hematoma and possible intraventricular hemorrhage C2 fracture Acute encephalopathy ETOH abuse Fluid overload Liver cirrhosis Hypoalbuminemia Suspected COPD  Recommendations/Plan:  DNR/DNI  Continue current plan of care and medical interventions. Watchful waiting.   Cortrak placement with plan to initiate tube feeds today.  Minimize sedative medications. Discontinued Lyrica. Will continue prn Haldol and HS Seroquel.  Safety sitter may be helpful with minimizing sedating medications and reorientation/redirection.  PMT provider to follow and further discuss [place] with niece early next week pending clinical status.  Code Status: DNR    Code Status Orders  (From admission, onward)  Start     Ordered   05/10/18 1343  Do not attempt resuscitation (DNR)  Continuous    Question Answer Comment  In the event of cardiac or respiratory ARREST Do not call a "code blue"   In the event of cardiac or respiratory ARREST Do not perform Intubation, CPR, defibrillation or ACLS   In the event of cardiac or respiratory ARREST Use medication by any route, position, wound care, and other measures to relive pain and suffering. May use oxygen, suction and manual treatment of airway obstruction as needed for comfort.      05/10/18 1343        Code Status History    Date Active Date Inactive Code Status Order ID Comments User Context   05/04/2018 0536 05/10/2018 1343 Full Code [id number]  Elpidio Eric ED   04/16/2017 1617 04/17/2017 1901 Full Code [id number]  McKenzieCandee Furbish, MD Inpatient       Prognosis:   Unable to determine  Discharge Planning:  To Be Determined  Care plan was discussed with Dr Karleen Hampshire, SLP  Thank you for allowing the Palliative Medicine Team to assist in the care of this patient.   Time In: 1105- Time Out: 1125 Total Time 20 Prolonged Time Billed  no      Greater than 50%  of this time was spent counseling and coordinating care related to the above assessment and plan.  Ihor Dow, FNP-C Palliative Medicine Team  Phone: [phone] Fax: [phone]  Please contact Palliative Medicine Team phone at [phone] for questions and concerns.

## 2018-05-13 NOTE — Procedures (Signed)
Cortrak  Person Inserting Tube:  [name], RD Tube Type:  Cortrak - 43 inches Tube Location:  Right nare Initial Placement:  Postpyloric Secured by: Bridle Technique Used to Measure Tube Placement:  Documented cm marking at nare/ corner of mouth Cortrak Secured At:  92 cm   No x-ray is required. RN may begin using tube.    If the tube becomes dislodged please keep the tube and contact the Cortrak team at [email or web address] (password [email or web address]) for replacement.  If after hours and replacement cannot be delayed, place a NG tube and confirm placement with an abdominal x-ray.    [name] RD, LDN Clinical Nutrition Pager # [phone]

## 2018-05-13 NOTE — Progress Notes (Signed)
PROGRESS NOTE    TIN ENGRAM  CZY:[id number] DOB: 1945-11-01 DOA: 05/04/2018 PCP: Celene Squibb, MD    Brief Narrative:   Mr. Robson is a 73 year old male with history of metastatic prostate cancer, Liver cirrhosis, diabetes mellitus, hypertension, alcohol abuse, tobacco abuse was admitted to neurosurgery on 4/29 with right intraparenchymal hematoma, subdural hematoma and a C2 fracture. -He had been awaiting SNF placement, through his hospitalization he was noted to have increased confusion and agitation as well as respiratory distress. -TRH was consulted 5/3 due to increased agitation and respiratory distres -Also noted to have hypoglycemia, pulmonary edema and pleural effusions 5/4: TRH took over from [place] service   Assessment & Plan:   Principal Problem:   Subdural hematoma (HCC) Active Problems:   Diabetes mellitus type 2 in nonobese [facility])   Essential hypertension   Hepatic cirrhosis (HCC)   Prostate cancer ([place])   Respiratory distress   Alcohol abuse   Palliative care by specialist   Goals of care, counseling/discussion   Alcoholic intoxication with complication ([place])   Thrombocytopenia ([place])   SAH (subarachnoid hemorrhage) (HCC)   Agitation  Right intraparenchymal hematoma Extensive right subdural hematoma and possible intraventricular hemorrhage with traumatic C2 fracture following a fall. Patient was initially admitted by neurosurgery. Nonoperative management recommended by neurosurgery. Patient is currently on c-collar. Neurosurgery recommends patient is stable from their standpoint and transferred patient to [facility] service. No new acute issues.     Acute encephalopathy Differentials include alcohol withdrawal versus hospital delirium. Patient is currently on CIWA protocol. Repeat CT head is negative for acute pathology. Patient continues to be somnolent and and agitated at times requiring wrist restraints. Low-dose Seroquel was added at bedtime.  No  improvement in patient's mental status in the the last 72 hours. Unfortunately, unable tot ake any meds safely via oral.  Core track to be placed and start tube feeds and palliative care consulted for goals of care and symptom management. [place] talks in progress.    Fluid overload secondary to liver cirrhosis and hypoalbuminemia CXR shows improvement.  Currently on RA and with good sats.  Patient is very euvolemic. COVID PCR is negative   Suspected COPD No wheezing heard    Diabetes mellitus CBG (last 3)  Recent Labs    05/13/18 0659 05/13/18 1146 05/13/18 1647  GLUCAP 79 79 75   Continue with sliding scale insulin while inpatient  Start tube feeds once cortrak is placed.     hypertension Well-controlled    Metastatic prostate cancer S/p seed implants       DVT prophylaxis: SCD'S Code Status: DNR Family Communication: none at bedside. Palliative care spoke to the patient's family and updated.  Disposition Plan:  Pending clinical improvement.    Consultants:   Neuro surgery. Dr Cato Mulligan   Procedures: none.    Antimicrobials: none.    Subjective: Still somnolent, . Grunts on calling his name.  Objective: Vitals:   05/13/18 0400 05/13/18 0738 05/13/18 1150 05/13/18 1600  BP: (!) 147/85 137/83  (!) 174/93  Pulse: 94 93    Resp: 18 17  17   Temp:  97.7 F (36.5 C) 98.1 F (36.7 C) 97.6 F (36.4 C)  TempSrc:  Oral Axillary Oral  SpO2: 98% 98%    Weight:      Height:        Intake/Output Summary (Last 24 hours) at 05/13/2018 1835 Last data filed at 05/13/2018 1801 Gross per 24 hour  Intake 1291.69 ml  Output 625 ml  Net 666.69 ml   Filed Weights   05/08/18 0900 05/09/18 2300  Weight: 88.2 kg 88.2 kg    Examination:  General exam:  Somnolent , only responding to painful stimuli.  Respiratory system: Clear to auscultation. Respiratory effort normal. Cardiovascular system: S1 & S2 heard, RRR. Marland Kitchen No pedal edema. Gastrointestinal system:  Abdomen is nondistended, soft . Normal bowel sounds heard. Central nervous system: pupils reactive,  Not responding to verbal cues, only responds to sternal rub with groans. Able to move his lower extremities.  Extremities: no pedal edema.  Skin: No rashes, lesions or ulcers Psychiatry: cannot be assessed.      Data Reviewed: I have personally reviewed following labs and imaging studies  CBC: Recent Labs  Lab 05/08/18 1237 05/09/18 0758 05/10/18 0343 05/11/18 0314  WBC 3.3* 2.8* 3.6* 3.5*  NEUTROABS 2.4  --   --   --   HGB 13.1 13.1 14.4 13.8  HCT 36.3* 35.5* 38.4* 37.2*  MCV 93.3 91.3 90.6 90.7  PLT 59* 53* 56* 62*   Basic Metabolic Panel: Recent Labs  Lab 05/08/18 0629 05/09/18 0758 05/10/18 0343 05/11/18 0314 05/12/18 0912  NA 138 135 136 136 136  K 3.2* 3.2* 2.9* 3.6 3.6  CL 102 98 91* 93* 96*  CO2 26 28 29 28 29   GLUCOSE 107* 103* 124* 90 113*  BUN 11 9 10 12 12   CREATININE 0.75 0.78 0.84 0.84 0.69  CALCIUM 7.9* 8.0* 8.1* 8.3* 8.6*   GFR: Estimated Creatinine Clearance: 91.7 mL/min (by C-G formula based on SCr of 0.69 mg/dL). Liver Function Tests: Recent Labs  Lab 05/08/18 1237 05/09/18 0758 05/11/18 0314  AST 30 28 28   ALT 25 25 24   ALKPHOS 144* 145* 155*  BILITOT 3.6* 4.1* 3.6*  PROT 5.5* 5.3* 5.2*  ALBUMIN 2.7* 2.7* 2.6*   No results for input(s): LIPASE, AMYLASE in the last 168 hours. No results for input(s): AMMONIA in the last 168 hours. Coagulation Profile: No results for input(s): INR, PROTIME in the last 168 hours. Cardiac Enzymes: No results for input(s): CKTOTAL, CKMB, CKMBINDEX, TROPONINI in the last 168 hours. BNP (last 3 results) No results for input(s): PROBNP in the last 8760 hours. HbA1C: No results for input(s): HGBA1C in the last 72 hours. CBG: Recent Labs  Lab 05/12/18 1610 05/12/18 2138 05/13/18 0659 05/13/18 1146 05/13/18 1647  GLUCAP 92 92 79 79 75   Lipid Profile: No results for input(s): CHOL, HDL, LDLCALC,  TRIG, CHOLHDL, LDLDIRECT in the last 72 hours. Thyroid Function Tests: No results for input(s): TSH, T4TOTAL, FREET4, T3FREE, THYROIDAB in the last 72 hours. Anemia Panel: No results for input(s): VITAMINB12, FOLATE, FERRITIN, TIBC, IRON, RETICCTPCT in the last 72 hours. Sepsis Labs: No results for input(s): PROCALCITON, LATICACIDVEN in the last 168 hours.  Recent Results (from the past 240 hour(s))  MRSA PCR Screening     Status: None   Collection Time: 05/04/18  6:37 AM  Result Value Ref Range Status   MRSA by PCR NEGATIVE NEGATIVE Final    Comment:        The GeneXpert MRSA Assay (FDA approved for NASAL specimens only), is one component of a comprehensive MRSA colonization surveillance program. It is not intended to diagnose MRSA infection nor to guide or monitor treatment for MRSA infections. Performed at [facility], [place] [address], [place], [place] [address]   SARS Coronavirus 2 (CEPHEID- Performed in [place] hospital lab), [facility] Order     Status: None  Collection Time: 05/08/18 11:30 AM  Result Value Ref Range Status   SARS Coronavirus 2 NEGATIVE NEGATIVE Final    Comment: (NOTE) If result is NEGATIVE SARS-CoV-2 target nucleic acids are NOT DETECTED. The SARS-CoV-2 RNA is generally detectable in upper and lower  respiratory specimens during the acute phase of infection. The lowest  concentration of SARS-CoV-2 viral copies this assay can detect is 250  copies / mL. A negative result does not preclude SARS-CoV-2 infection  and should not be used as the sole basis for treatment or other  patient management decisions.  A negative result may occur with  improper specimen collection / handling, submission of specimen other  than nasopharyngeal swab, presence of viral mutation(s) within the  areas targeted by this assay, and inadequate number of viral copies  (<250 copies / mL). A negative result must be combined with clinical  observations, patient history, and  epidemiological information. If result is POSITIVE SARS-CoV-2 target nucleic acids are DETECTED. The SARS-CoV-2 RNA is generally detectable in upper and lower  respiratory specimens dur ing the acute phase of infection.  Positive  results are indicative of active infection with SARS-CoV-2.  Clinical  correlation with patient history and other diagnostic information is  necessary to determine patient infection status.  Positive results do  not rule out bacterial infection or co-infection with other viruses. If result is PRESUMPTIVE POSTIVE SARS-CoV-2 nucleic acids MAY BE PRESENT.   A presumptive positive result was obtained on the submitted specimen  and confirmed on repeat testing.  While 2019 novel coronavirus  (SARS-CoV-2) nucleic acids may be present in the submitted sample  additional confirmatory testing may be necessary for epidemiological  and / or clinical management purposes  to differentiate between  SARS-CoV-2 and other Sarbecovirus currently known to infect humans.  If clinically indicated additional testing with an alternate test  methodology [phone]) is advised. The SARS-CoV-2 RNA is generally  detectable in upper and lower respiratory sp ecimens during the acute  phase of infection. The expected result is Negative. Fact Sheet for Patients:  StrictlyIdeas.no Fact Sheet for Healthcare Providers: BankingDealers.co.za This test is not yet approved or cleared by the [place] FDA and has been authorized for detection and/or diagnosis of SARS-CoV-2 by FDA under an Emergency Use Authorization (EUA).  This EUA will remain in effect (meaning this test can be used) for the duration of the COVID-19 declaration under Section 564(b)(1) of the Act, 21 U.S.C. section 360bbb-3(b)(1), unless the authorization is terminated or revoked sooner. Performed at [facility], [place] [address], [place], [place] [address]           Radiology Studies: Dg Chest Port 1 View  Result Date: 05/12/2018 CLINICAL DATA:  Shortness of breath EXAM: PORTABLE CHEST 1 VIEW COMPARISON:  Chest x-ray dated 05/07/2018 FINDINGS: There is persistent significant cardiomegaly. The patient's previously demonstrated pulmonary edema has improved somewhat since the prior study. There is no pneumothorax. The lung volumes are low. Small bilateral pleural effusions are again noted. Old healed right-sided rib fractures are again noted. There are old healed left-sided rib fractures. There is an old healed right-sided clavicle fracture. IMPRESSION: Cardiomegaly with improving pulmonary edema. Electronically Signed   By: Constance Holster M.D.   On: 05/12/2018 16:46        Scheduled Meds: . sodium chloride   Intravenous Once  . bacitracin   Topical BID  . chlorhexidine  15 mL Mouth Rinse BID  . feeding supplement (PRO-STAT SUGAR FREE 64)  30 mL Per Tube Daily  . [START ON 08/08/696] folic acid  1 mg Per Tube Daily  . mouth rinse  15 mL Mouth Rinse q12n4p  . nicotine  14 mg Transdermal Daily  . pravastatin  20 mg Per Tube QHS  . QUEtiapine  12.5 mg Per Tube QHS  . [START ON 05/14/2018] thiamine  100 mg Per Tube Daily   Continuous Infusions: . sodium chloride 75 mL/hr at 05/13/18 0400  . dextrose 5 % with kcl Stopped (05/10/18 1126)  . feeding supplement (OSMOLITE 1.5 CAL) 1,000 mL (05/13/18 1833)     LOS: 9 days    Time spent: 28 minutes.     Hosie Poisson, MD [facility] Pager [phone]  If 7PM-7AM, please contact night-coverage [email or web address] Password [facility] 05/13/2018, 6:35 PM

## 2018-05-13 NOTE — Progress Notes (Signed)
Nutrition Follow-up   RD working remotely.  DOCUMENTATION CODES:   Not applicable  INTERVENTION:  Initiate Osmolite 1.5 formula @ 20 ml/hr via Cortrak NGT and increase by 10 ml every 8 hours to goal rate of 60 ml/hr.   30 ml Prostat once daily.    Tube feeding regimen provides 2260 kcal (100% of needs), 105 grams of protein, and 1094 ml of H2O.   Once IV fluids are discontinued, recommend free water flushes of 200 ml QID.   Monitor magnesium, potassium, and phosphorus daily for at least 3 days, MD to replete as needed, as pt is at risk for refeeding syndrome given no nutrition since admission (9 days).  NUTRITION DIAGNOSIS:   Inadequate oral intake related to inability to eat as evidenced by NPO status; ongoing  GOAL:   Patient will meet greater than or equal to 90% of their needs; progressing  MONITOR:   TF tolerance, Labs, Skin, Weight trends, I & O's  REASON FOR ASSESSMENT:   NPO/Clear Liquid Diet, Low Braden    ASSESSMENT:   73 year old male with history of metastatic prostate cancer, Liver cirrhosis, diabetes mellitus, hypertension, alcohol abuse, tobacco abuse was admitted to neurosurgery on 4/29 with right intraparenchymal hematoma, subdural hematoma and a C2 fracture.  Pt continues on NPO status due to decreased alertness. Cortrak NGT has been placed this AM. Tip of tube placed post pyloric. RD consulted for enteral/tube feeding initiation and management. Noted pt with no nutrition since admission (9 days). Pt at risk for refeeding syndrome. RD to initiate tube feeds at a low and slow rate. RD to continue to monitor.   Labs and medications reviewed.   Diet Order:   Diet Order            Diet NPO time specified  Diet effective now              EDUCATION NEEDS:   Not appropriate for education at this time  Skin:  Skin Assessment: Reviewed RN Assessment  Last BM:  5/7  Height:   Ht Readings from Last 1 Encounters:  05/09/18 6' 0.52" (1.842 m)     Weight:   Wt Readings from Last 1 Encounters:  05/09/18 88.2 kg    Ideal Body Weight:  82.3 kg  BMI:  Body mass index is 25.99 kg/m.  Estimated Nutritional Needs:   Kcal:  2150-2350  Protein:  105-120 grams  Fluid:  >/= 2 L/day    [name] Parker, MS, RD, LDN Pager # [phone] After hours/ weekend pager # [phone]

## 2018-05-13 NOTE — Progress Notes (Addendum)
SLP Cancellation Note  Patient Details Name: [name] MRN: [id number] DOB: 02/16/1945   Cancelled treatment:        Pt able to open eyes several seconds, state first and last name and falls back asleep in between periods of restlessness. SLP cannot assess swallow function with pt sedated which has been the case most of the week. Recommend a sitter, avoid Ativan, Haldol if possible. Seems he could be redirectible with sitter. Once he is more alert, able to attend and participate, then can reevaluate swallow. He now has a Water quality scientist. Will plan to check on Monday. If he improves before please call weekend speech pager [phone]).   [name] 05/13/2018, 11:09 AM   Orbie Pyo Colvin Caroli.Ed Actor Pager [phone] Office [phone]  s

## 2018-05-14 LAB — BASIC METABOLIC PANEL
Anion gap: 9 (ref 5–15)
BUN: 13 mg/dL (ref 8–23)
CO2: 28 mmol/L (ref 22–32)
Calcium: 8.5 mg/dL — ABNORMAL LOW (ref 8.9–10.3)
Chloride: 102 mmol/L (ref 98–111)
Creatinine, Ser: 0.73 mg/dL (ref 0.61–1.24)
GFR calc Af Amer: >60 mL/min (ref >60)
GFR calc non Af Amer: >60 mL/min (ref >60)
Glucose, Bld: 111 mg/dL — ABNORMAL HIGH (ref 70–99)
Potassium: 3.5 mmol/L (ref 3.5–5.1)
Sodium: 139 mmol/L (ref 135–145)

## 2018-05-14 LAB — GLUCOSE, CAPILLARY
Glucose-Capillary: 116 mg/dL — ABNORMAL HIGH (ref 70–99)
Glucose-Capillary: 129 mg/dL — ABNORMAL HIGH (ref 70–99)
Glucose-Capillary: 131 mg/dL — ABNORMAL HIGH (ref 70–99)
Glucose-Capillary: 144 mg/dL — ABNORMAL HIGH (ref 70–99)
Glucose-Capillary: 146 mg/dL — ABNORMAL HIGH (ref 70–99)
Glucose-Capillary: 171 mg/dL — ABNORMAL HIGH (ref 70–99)
Glucose-Capillary: 95 mg/dL (ref 70–99)

## 2018-05-14 LAB — PHOSPHORUS: Phosphorus: 2.8 mg/dL (ref 2.5–4.6)

## 2018-05-14 LAB — MAGNESIUM: Magnesium: 1.6 mg/dL — ABNORMAL LOW (ref 1.7–2.4)

## 2018-05-14 MED ORDER — POTASSIUM CHLORIDE 20 MEQ PO PACK
60.0000 meq | PACK | Freq: Once | ORAL | Status: AC
  Administered 2018-05-14: 60 meq
  Filled 2018-05-14: qty 3

## 2018-05-14 MED ORDER — LOPERAMIDE HCL 2 MG PO CAPS
2.0000 mg | ORAL_CAPSULE | ORAL | Status: DC | PRN
  Administered 2018-05-14 – 2018-05-15 (×2): 2 mg via ORAL
  Filled 2018-05-14 (×2): qty 1

## 2018-05-14 MED ORDER — MAGNESIUM SULFATE 50 % IJ SOLN
3.0000 g | Freq: Once | INTRAVENOUS | Status: AC
  Administered 2018-05-14: 12:00:00 3 g via INTRAVENOUS
  Filled 2018-05-14: qty 6

## 2018-05-14 MED ORDER — GERHARDT'S BUTT CREAM
TOPICAL_CREAM | Freq: Three times a day (TID) | CUTANEOUS | Status: DC
  Administered 2018-05-15 – 2018-05-19 (×11): via TOPICAL
  Administered 2018-05-19: 1 via TOPICAL
  Administered 2018-05-19 – 2018-05-24 (×15): via TOPICAL
  Filled 2018-05-14 (×2): qty 1

## 2018-05-14 NOTE — Progress Notes (Signed)
PROGRESS NOTE    [name]  QMG:[id number] DOB: 31-Mar-1945 DOA: 05/04/2018 PCP: Celene Squibb, MD    Brief Narrative:   [name] is a 73 year old male with history of metastatic prostate cancer, Liver cirrhosis, diabetes mellitus, hypertension, alcohol abuse, tobacco abuse was admitted to neurosurgery on 4/29 with right intraparenchymal hematoma, subdural hematoma and a C2 fracture. -He had been awaiting SNF placement, through his hospitalization he was noted to have increased confusion and agitation as well as respiratory distress. -TRH was consulted 5/3 due to increased agitation and respiratory distres -Also noted to have hypoglycemia, pulmonary edema and pleural effusions 5/4: TRH took over from [place] service   Assessment & Plan:   Principal Problem:   Subdural hematoma (HCC) Active Problems:   Diabetes mellitus type 2 in nonobese [facility])   Essential hypertension   Hepatic cirrhosis (HCC)   Prostate cancer ([name])   Respiratory distress   Alcohol abuse   Palliative care by specialist   Goals of care, counseling/discussion   Alcoholic intoxication with complication ([place])   Thrombocytopenia ([place])   SAH (subarachnoid hemorrhage) (HCC)   Agitation  Right intraparenchymal hematoma Extensive right subdural hematoma and possible intraventricular hemorrhage with traumatic C2 fracture following a fall. Patient was initially admitted by neurosurgery. Nonoperative management recommended by neurosurgery. Patient is currently on c-collar. Neurosurgery recommends patient is stable from their standpoint and transferred patient to [facility] service. No new acute issues.     Acute encephalopathy Differentials include alcohol withdrawal versus hospital delirium. Patient is currently on CIWA protocol. Repeat CT head is negative for acute pathology. Patient is alert but confused, he more alert today than the last 3 days  But very confused and restless.  Low-dose Seroquel was added  at bedtime. Unfortunately, unable to take any meds safely via oral.  Core track to be placed and start tube feeds and palliative care consulted for goals of care and symptom management. [place] talks in progress.    Fluid overload secondary to liver cirrhosis and hypoalbuminemia CXR shows improvement.  Currently on RA and with good sats.  Patient is very euvolemic. COVID PCR is negative   Suspected COPD No wheezing heard    Diabetes mellitus CBG (last 3)  Recent Labs    05/14/18 0751 05/14/18 1110 05/14/18 1602  GLUCAP 144* 171* 129*   Continue with sliding scale insulin while inpatient  Start tube feeds once cortrak is placed.     hypertension Well-controlled    Metastatic prostate cancer S/p seed implants       DVT prophylaxis: SCD'S Code Status: DNR Family Communication: none at bedside. Palliative care spoke to the patient's family and updated. Discussed with the niece.  Disposition Plan:  Pending clinical improvement.    Consultants:   Neuro surgery. Dr Cato Mulligan   Procedures: none.    Antimicrobials: none.    Subjective: Alert and confused, able to tell us his name but very restless.   Objective: Vitals:   05/14/18 0750 05/14/18 1107 05/14/18 1110 05/14/18 1600  BP: (!) 178/90 (!) 163/93 137/78 (!) 144/67  Pulse: 93  93 90  Resp: 17  20 17   Temp: 98.1 F (36.7 C)  97.8 F (36.6 C) 97.6 F (36.4 C)  TempSrc: Axillary  Oral Axillary  SpO2: 95%  94% 97%  Weight:      Height:        Intake/Output Summary (Last 24 hours) at 05/14/2018 1606 Last data filed at 05/14/2018 1500 Gross per 24 hour  Intake 1674.93  ml  Output 650 ml  Net 1024.93 ml   Filed Weights   05/08/18 0900 05/09/18 2300  Weight: 88.2 kg 88.2 kg    Examination:  General exam:  Alert and confused and restless.  Respiratory system: Clear to auscultation. Respiratory effort normal. No wheezing or rhonchi.  Cardiovascular system: S1 & S2 heard, RRR. Marland Kitchen No pedal edema.  Gastrointestinal system: Abdomen is nondistended, soft . Normal bowel sounds heard. Central nervous system: alert , confused, restless, able to move all extremities.  Extremities: no pedal edema.  Skin: No rashes, lesions or ulcers Psychiatry: cannot be assessed.      Data Reviewed: I have personally reviewed following labs and imaging studies  CBC: Recent Labs  Lab 05/08/18 1237 05/09/18 0758 05/10/18 0343 05/11/18 0314  WBC 3.3* 2.8* 3.6* 3.5*  NEUTROABS 2.4  --   --   --   HGB 13.1 13.1 14.4 13.8  HCT 36.3* 35.5* 38.4* 37.2*  MCV 93.3 91.3 90.6 90.7  PLT 59* 53* 56* 62*   Basic Metabolic Panel: Recent Labs  Lab 05/09/18 0758 05/10/18 0343 05/11/18 0314 05/12/18 0912 05/14/18 0320  NA 135 136 136 136 139  K 3.2* 2.9* 3.6 3.6 3.5  CL 98 91* 93* 96* 102  CO2 28 29 28 29 28   GLUCOSE 103* 124* 90 113* 111*  BUN 9 10 12 12 13   CREATININE 0.78 0.84 0.84 0.69 0.73  CALCIUM 8.0* 8.1* 8.3* 8.6* 8.5*  MG  --   --   --   --  1.6*  PHOS  --   --   --   --  2.8   GFR: Estimated Creatinine Clearance: 91.7 mL/min (by C-G formula based on SCr of 0.73 mg/dL). Liver Function Tests: Recent Labs  Lab 05/08/18 1237 05/09/18 0758 05/11/18 0314  AST 30 28 28   ALT 25 25 24   ALKPHOS 144* 145* 155*  BILITOT 3.6* 4.1* 3.6*  PROT 5.5* 5.3* 5.2*  ALBUMIN 2.7* 2.7* 2.6*   No results for input(s): LIPASE, AMYLASE in the last 168 hours. No results for input(s): AMMONIA in the last 168 hours. Coagulation Profile: No results for input(s): INR, PROTIME in the last 168 hours. Cardiac Enzymes: No results for input(s): CKTOTAL, CKMB, CKMBINDEX, TROPONINI in the last 168 hours. BNP (last 3 results) No results for input(s): PROBNP in the last 8760 hours. HbA1C: No results for input(s): HGBA1C in the last 72 hours. CBG: Recent Labs  Lab 05/14/18 0009 05/14/18 0315 05/14/18 0751 05/14/18 1110 05/14/18 1602  GLUCAP 95 116* 144* 171* 129*   Lipid Profile: No results for  input(s): CHOL, HDL, LDLCALC, TRIG, CHOLHDL, LDLDIRECT in the last 72 hours. Thyroid Function Tests: No results for input(s): TSH, T4TOTAL, FREET4, T3FREE, THYROIDAB in the last 72 hours. Anemia Panel: No results for input(s): VITAMINB12, FOLATE, FERRITIN, TIBC, IRON, RETICCTPCT in the last 72 hours. Sepsis Labs: No results for input(s): PROCALCITON, LATICACIDVEN in the last 168 hours.  Recent Results (from the past 240 hour(s))  SARS Coronavirus 2 (CEPHEID- Performed in [place] hospital lab), Hosp Order     Status: None   Collection Time: 05/08/18 11:30 AM  Result Value Ref Range Status   SARS Coronavirus 2 NEGATIVE NEGATIVE Final    Comment: (NOTE) If result is NEGATIVE SARS-CoV-2 target nucleic acids are NOT DETECTED. The SARS-CoV-2 RNA is generally detectable in upper and lower  respiratory specimens during the acute phase of infection. The lowest  concentration of SARS-CoV-2 viral copies this assay can detect  is 250  copies / mL. A negative result does not preclude SARS-CoV-2 infection  and should not be used as the sole basis for treatment or other  patient management decisions.  A negative result may occur with  improper specimen collection / handling, submission of specimen other  than nasopharyngeal swab, presence of viral mutation(s) within the  areas targeted by this assay, and inadequate number of viral copies  (<250 copies / mL). A negative result must be combined with clinical  observations, patient history, and epidemiological information. If result is POSITIVE SARS-CoV-2 target nucleic acids are DETECTED. The SARS-CoV-2 RNA is generally detectable in upper and lower  respiratory specimens dur ing the acute phase of infection.  Positive  results are indicative of active infection with SARS-CoV-2.  Clinical  correlation with patient history and other diagnostic information is  necessary to determine patient infection status.  Positive results do  not rule out  bacterial infection or co-infection with other viruses. If result is PRESUMPTIVE POSTIVE SARS-CoV-2 nucleic acids MAY BE PRESENT.   A presumptive positive result was obtained on the submitted specimen  and confirmed on repeat testing.  While 2019 novel coronavirus  (SARS-CoV-2) nucleic acids may be present in the submitted sample  additional confirmatory testing may be necessary for epidemiological  and / or clinical management purposes  to differentiate between  SARS-CoV-2 and other Sarbecovirus currently known to infect humans.  If clinically indicated additional testing with an alternate test  methodology [phone]) is advised. The SARS-CoV-2 RNA is generally  detectable in upper and lower respiratory sp ecimens during the acute  phase of infection. The expected result is Negative. Fact Sheet for Patients:  StrictlyIdeas.no Fact Sheet for Healthcare Providers: BankingDealers.co.za This test is not yet approved or cleared by the [place] FDA and has been authorized for detection and/or diagnosis of SARS-CoV-2 by FDA under an Emergency Use Authorization (EUA).  This EUA will remain in effect (meaning this test can be used) for the duration of the COVID-19 declaration under Section 564(b)(1) of the Act, 21 U.S.C. section 360bbb-3(b)(1), unless the authorization is terminated or revoked sooner. Performed at [facility], [place] [address] [place], [place] [address]          Radiology Studies: Dg Chest Port 1 View  Result Date: 05/12/2018 CLINICAL DATA:  Shortness of breath EXAM: PORTABLE CHEST 1 VIEW COMPARISON:  Chest x-ray dated 05/07/2018 FINDINGS: There is persistent significant cardiomegaly. The patient's previously demonstrated pulmonary edema has improved somewhat since the prior study. There is no pneumothorax. The lung volumes are low. Small bilateral pleural effusions are again noted. Old healed right-sided rib  fractures are again noted. There are old healed left-sided rib fractures. There is an old healed right-sided clavicle fracture. IMPRESSION: Cardiomegaly with improving pulmonary edema. Electronically Signed   By: Constance Holster M.D.   On: 05/12/2018 16:46        Scheduled Meds: . sodium chloride   Intravenous Once  . bacitracin   Topical BID  . chlorhexidine  15 mL Mouth Rinse BID  . feeding supplement (PRO-STAT SUGAR FREE 64)  30 mL Per Tube Daily  . folic acid  1 mg Per Tube Daily  . mouth rinse  15 mL Mouth Rinse q12n4p  . nicotine  14 mg Transdermal Daily  . pravastatin  20 mg Per Tube QHS  . QUEtiapine  12.5 mg Per Tube QHS  . thiamine  100 mg Per Tube Daily   Continuous Infusions: . dextrose  5 % with kcl Stopped (05/10/18 1126)  . feeding supplement (OSMOLITE 1.5 CAL) 60 mL/hr at 05/14/18 0230     LOS: 10 days    Time spent: 28 minutes.     Hosie Poisson, MD [facility] Pager [phone]  If 7PM-7AM, please contact night-coverage [email or web address] Password [email or web address] 05/14/2018, 4:06 PM

## 2018-05-14 NOTE — Progress Notes (Signed)
Pt less lethargic than previously. Confused and asking to go home. Pt now able to answer questions although he remains confused.   0230: Tube feed increased to 30 mL/hr. Pt tolerating well. Will continue to monitor.

## 2018-05-14 NOTE — Progress Notes (Signed)
Assisted tele visit to patient with family member.  Glennis [place] M, RN  

## 2018-05-15 LAB — GLUCOSE, CAPILLARY
Glucose-Capillary: 151 mg/dL — ABNORMAL HIGH (ref 70–99)
Glucose-Capillary: 197 mg/dL — ABNORMAL HIGH (ref 70–99)
Glucose-Capillary: 161 mg/dL — ABNORMAL HIGH (ref 70–99)
Glucose-Capillary: 167 mg/dL — ABNORMAL HIGH (ref 70–99)
Glucose-Capillary: 218 mg/dL — ABNORMAL HIGH (ref 70–99)

## 2018-05-15 LAB — BASIC METABOLIC PANEL
Anion gap: 10 (ref 5–15)
BUN: 12 mg/dL (ref 8–23)
CO2: 28 mmol/L (ref 22–32)
Calcium: 8.6 mg/dL — ABNORMAL LOW (ref 8.9–10.3)
Chloride: 100 mmol/L (ref 98–111)
Creatinine, Ser: 0.77 mg/dL (ref 0.61–1.24)
GFR calc Af Amer: >60 mL/min (ref >60)
GFR calc non Af Amer: >60 mL/min (ref >60)
Glucose, Bld: 172 mg/dL — ABNORMAL HIGH (ref 70–99)
Potassium: 3.7 mmol/L (ref 3.5–5.1)
Sodium: 138 mmol/L (ref 135–145)

## 2018-05-15 LAB — MAGNESIUM: Magnesium: 2.1 mg/dL (ref 1.7–2.4)

## 2018-05-15 LAB — PHOSPHORUS: Phosphorus: 3 mg/dL (ref 2.5–4.6)

## 2018-05-15 MED ORDER — INSULIN ASPART 100 UNIT/ML ~~LOC~~ SOLN
0.0000 [IU] | Freq: Three times a day (TID) | SUBCUTANEOUS | Status: DC
  Administered 2018-05-16: 08:00:00 3 [IU] via SUBCUTANEOUS

## 2018-05-15 NOTE — Progress Notes (Signed)
PROGRESS NOTE    [name]  BSW:[id number] DOB: 21-Jul-1945 DOA: 05/04/2018 PCP: Celene Squibb, MD    Brief Narrative:   Mr. Tanimoto is a 73 year old male with history of metastatic prostate cancer, Liver cirrhosis, diabetes mellitus, hypertension, alcohol abuse, tobacco abuse was admitted to neurosurgery on 4/29 with right intraparenchymal hematoma, subdural hematoma and a C2 fracture. -He had been awaiting SNF placement, through his hospitalization he was noted to have increased confusion and agitation as well as respiratory distress. -TRH was consulted 5/3 due to increased agitation and respiratory distres -Also noted to have hypoglycemia, pulmonary edema and pleural effusions 5/4: TRH took over from [place] service   Assessment & Plan:   Principal Problem:   Subdural hematoma (HCC) Active Problems:   Diabetes mellitus type 2 in nonobese [facility])   Essential hypertension   Hepatic cirrhosis (HCC)   Prostate cancer ([place])   Respiratory distress   Alcohol abuse   Palliative care by specialist   Goals of care, counseling/discussion   Alcoholic intoxication with complication ([place])   Thrombocytopenia ([place])   SAH (subarachnoid hemorrhage) (HCC)   Agitation  Right intraparenchymal hematoma Extensive right subdural hematoma and possible intraventricular hemorrhage with traumatic C2 fracture following a fall. Patient was initially admitted by neurosurgery. Nonoperative management recommended by neurosurgery. Patient is currently on c-collar. Neurosurgery recommends patient is stable from their standpoint and transferred patient to [facility] service. No new acute issues.  Pt continues to be confused and lethargic, will repeat CT head without contrast in am.     Acute encephalopathy Differentials include alcohol withdrawal versus hospital delirium. Patient is currently on CIWA protocol. Repeat CT head is negative for acute pathology. Patient is alert but confused, he more alert  today than the last 3 days  But very confused and restless.  Low-dose Seroquel was added at bedtime.  Core track to be placed and start tube feeds and palliative care consulted for goals of care and symptom management. [place] talks in progress.  Pt continues to be confused, will repeat CT head without contrast in am.    Fluid overload secondary to liver cirrhosis and hypoalbuminemia CXR shows improvement.  Currently on RA and with good sats.  Patient is very euvolemic. COVID PCR is negative   Suspected COPD No wheezing heard    Diabetes mellitus CBG (last 3)  Recent Labs    05/15/18 0752 05/15/18 1203 05/15/18 1635  GLUCAP 197* 161* 167*   Continue with sliding scale insulin while inpatient  Start tube feeds once cortrak is placed.     hypertension Well-controlled    Metastatic prostate cancer S/p seed implants       DVT prophylaxis: SCD'S Code Status: DNR Family Communication: none at bedside. Palliative care spoke to the patient's family and updated. Discussed with the niece.  Disposition Plan:  Pending clinical improvement.    Consultants:   Neuro surgery. Dr Cato Mulligan   Procedures: none.    Antimicrobials: none.    Subjective: Alert and confused, able to tell [place] his name and date of birth.  No t agitated.   Objective: Vitals:   05/15/18 0323 05/15/18 0700 05/15/18 1206 05/15/18 1633  BP: 126/74 (!) 143/74 116/84 119/66  Pulse: 78 88 84   Resp: 16 18    Temp: 98.7 F (37.1 C) 99 F (37.2 C) 98.2 F (36.8 C) 98.3 F (36.8 C)  TempSrc: Axillary  Oral Oral  SpO2: 95% 96%    Weight:      Height:  No intake or output data in the 24 hours ending 05/15/18 1844 Filed Weights   05/08/18 0900 05/09/18 2300  Weight: 88.2 kg 88.2 kg    Examination:  General exam:  Alert and confused and restless.  Respiratory system: Clear to auscultation. Respiratory effort normal. No wheezing or rhonchi.  Cardiovascular system: S1 & S2 heard, RRR.  Marland Kitchen No pedal edema. Gastrointestinal system: Abdomen is nondistended, soft . Normal bowel sounds heard. Central nervous system: alert , confused, restless, able to move all extremities.  Extremities: no pedal edema.  Skin: No rashes, lesions or ulcers Psychiatry: cannot be assessed.      Data Reviewed: I have personally reviewed following labs and imaging studies  CBC: Recent Labs  Lab 05/09/18 0758 05/10/18 0343 05/11/18 0314  WBC 2.8* 3.6* 3.5*  HGB 13.1 14.4 13.8  HCT 35.5* 38.4* 37.2*  MCV 91.3 90.6 90.7  PLT 53* 56* 62*   Basic Metabolic Panel: Recent Labs  Lab 05/10/18 0343 05/11/18 0314 05/12/18 0912 05/14/18 0320 05/15/18 0245  NA 136 136 136 139 138  K 2.9* 3.6 3.6 3.5 3.7  CL 91* 93* 96* 102 100  CO2 29 28 29 28 28   GLUCOSE 124* 90 113* 111* 172*  BUN 10 12 12 13 12   CREATININE 0.84 0.84 0.69 0.73 0.77  CALCIUM 8.1* 8.3* 8.6* 8.5* 8.6*  MG  --   --   --  1.6* 2.1  PHOS  --   --   --  2.8 3.0   GFR: Estimated Creatinine Clearance: 91.7 mL/min (by C-G formula based on SCr of 0.77 mg/dL). Liver Function Tests: Recent Labs  Lab 05/09/18 0758 05/11/18 0314  AST 28 28  ALT 25 24  ALKPHOS 145* 155*  BILITOT 4.1* 3.6*  PROT 5.3* 5.2*  ALBUMIN 2.7* 2.6*   No results for input(s): LIPASE, AMYLASE in the last 168 hours. No results for input(s): AMMONIA in the last 168 hours. Coagulation Profile: No results for input(s): INR, PROTIME in the last 168 hours. Cardiac Enzymes: No results for input(s): CKTOTAL, CKMB, CKMBINDEX, TROPONINI in the last 168 hours. BNP (last 3 results) No results for input(s): PROBNP in the last 8760 hours. HbA1C: No results for input(s): HGBA1C in the last 72 hours. CBG: Recent Labs  Lab 05/14/18 2325 05/15/18 0310 05/15/18 0752 05/15/18 1203 05/15/18 1635  GLUCAP 146* 151* 197* 161* 167*   Lipid Profile: No results for input(s): CHOL, HDL, LDLCALC, TRIG, CHOLHDL, LDLDIRECT in the last 72 hours. Thyroid Function Tests:  No results for input(s): TSH, T4TOTAL, FREET4, T3FREE, THYROIDAB in the last 72 hours. Anemia Panel: No results for input(s): VITAMINB12, FOLATE, FERRITIN, TIBC, IRON, RETICCTPCT in the last 72 hours. Sepsis Labs: No results for input(s): PROCALCITON, LATICACIDVEN in the last 168 hours.  Recent Results (from the past 240 hour(s))  SARS Coronavirus 2 (CEPHEID- Performed in [place] hospital lab), Hosp Order     Status: None   Collection Time: 05/08/18 11:30 AM  Result Value Ref Range Status   SARS Coronavirus 2 NEGATIVE NEGATIVE Final    Comment: (NOTE) If result is NEGATIVE SARS-CoV-2 target nucleic acids are NOT DETECTED. The SARS-CoV-2 RNA is generally detectable in upper and lower  respiratory specimens during the acute phase of infection. The lowest  concentration of SARS-CoV-2 viral copies this assay can detect is 250  copies / mL. A negative result does not preclude SARS-CoV-2 infection  and should not be used as the sole basis for treatment or other  patient  management decisions.  A negative result may occur with  improper specimen collection / handling, submission of specimen other  than nasopharyngeal swab, presence of viral mutation(s) within the  areas targeted by this assay, and inadequate number of viral copies  (<250 copies / mL). A negative result must be combined with clinical  observations, patient history, and epidemiological information. If result is POSITIVE SARS-CoV-2 target nucleic acids are DETECTED. The SARS-CoV-2 RNA is generally detectable in upper and lower  respiratory specimens dur ing the acute phase of infection.  Positive  results are indicative of active infection with SARS-CoV-2.  Clinical  correlation with patient history and other diagnostic information is  necessary to determine patient infection status.  Positive results do  not rule out bacterial infection or co-infection with other viruses. If result is PRESUMPTIVE POSTIVE SARS-CoV-2  nucleic acids MAY BE PRESENT.   A presumptive positive result was obtained on the submitted specimen  and confirmed on repeat testing.  While 2019 novel coronavirus  (SARS-CoV-2) nucleic acids may be present in the submitted sample  additional confirmatory testing may be necessary for epidemiological  and / or clinical management purposes  to differentiate between  SARS-CoV-2 and other Sarbecovirus currently known to infect humans.  If clinically indicated additional testing with an alternate test  methodology [phone]) is advised. The SARS-CoV-2 RNA is generally  detectable in upper and lower respiratory sp ecimens during the acute  phase of infection. The expected result is Negative. Fact Sheet for Patients:  StrictlyIdeas.no Fact Sheet for Healthcare Providers: BankingDealers.co.za This test is not yet approved or cleared by the [place] FDA and has been authorized for detection and/or diagnosis of SARS-CoV-2 by FDA under an Emergency Use Authorization (EUA).  This EUA will remain in effect (meaning this test can be used) for the duration of the COVID-19 declaration under Section 564(b)(1) of the Act, 21 U.S.C. section 360bbb-3(b)(1), unless the authorization is terminated or revoked sooner. Performed at [facility], [place] [address], [place], [place] [address]          Radiology Studies: No results found.      Scheduled Meds: . sodium chloride   Intravenous Once  . bacitracin   Topical BID  . chlorhexidine  15 mL Mouth Rinse BID  . feeding supplement (PRO-STAT SUGAR FREE 64)  30 mL Per Tube Daily  . folic acid  1 mg Per Tube Daily  . Gerhardt's butt cream   Topical TID  . mouth rinse  15 mL Mouth Rinse q12n4p  . nicotine  14 mg Transdermal Daily  . pravastatin  20 mg Per Tube QHS  . QUEtiapine  12.5 mg Per Tube QHS  . thiamine  100 mg Per Tube Daily   Continuous Infusions: . feeding supplement (OSMOLITE  1.5 CAL) 1,000 mL (05/14/18 2057)     LOS: 11 days    Time spent: 28 minutes.     Hosie Poisson, MD [facility] Pager [phone]  If 7PM-7AM, please contact night-coverage [email or web address] Password [facility] 05/15/2018, 6:44 PM

## 2018-05-15 NOTE — Progress Notes (Signed)
2258: Current CBG 218. No parameters for insulin coverage. Paged on-call provider.   Received future order for insulin w/ meals. Will continue to monitor.

## 2018-05-16 ENCOUNTER — Inpatient Hospital Stay (HOSPITAL_COMMUNITY): Payer: Medicare Other

## 2018-05-16 LAB — BASIC METABOLIC PANEL
Anion gap: 11 (ref 5–15)
BUN: 11 mg/dL (ref 8–23)
CO2: 28 mmol/L (ref 22–32)
Calcium: 8.5 mg/dL — ABNORMAL LOW (ref 8.9–10.3)
Chloride: 98 mmol/L (ref 98–111)
Creatinine, Ser: 0.77 mg/dL (ref 0.61–1.24)
GFR calc Af Amer: >60 mL/min (ref >60)
GFR calc non Af Amer: >60 mL/min (ref >60)
Glucose, Bld: 179 mg/dL — ABNORMAL HIGH (ref 70–99)
Potassium: 4.4 mmol/L (ref 3.5–5.1)
Sodium: 137 mmol/L (ref 135–145)

## 2018-05-16 LAB — MAGNESIUM: Magnesium: 1.9 mg/dL (ref 1.7–2.4)

## 2018-05-16 LAB — GLUCOSE, CAPILLARY
Glucose-Capillary: 151 mg/dL — ABNORMAL HIGH (ref 70–99)
Glucose-Capillary: 152 mg/dL — ABNORMAL HIGH (ref 70–99)
Glucose-Capillary: 156 mg/dL — ABNORMAL HIGH (ref 70–99)
Glucose-Capillary: 161 mg/dL — ABNORMAL HIGH (ref 70–99)
Glucose-Capillary: 190 mg/dL — ABNORMAL HIGH (ref 70–99)
Glucose-Capillary: 199 mg/dL — ABNORMAL HIGH (ref 70–99)
Glucose-Capillary: 204 mg/dL — ABNORMAL HIGH (ref 70–99)

## 2018-05-16 LAB — PHOSPHORUS: Phosphorus: 3.5 mg/dL (ref 2.5–4.6)

## 2018-05-16 MED ORDER — INSULIN ASPART 100 UNIT/ML ~~LOC~~ SOLN
0.0000 [IU] | SUBCUTANEOUS | Status: DC
  Administered 2018-05-16 – 2018-05-17 (×4): 3 [IU] via SUBCUTANEOUS
  Administered 2018-05-17: 08:00:00 2 [IU] via SUBCUTANEOUS
  Administered 2018-05-17 (×2): 3 [IU] via SUBCUTANEOUS
  Administered 2018-05-17: 5 [IU] via SUBCUTANEOUS
  Administered 2018-05-17: 2 [IU] via SUBCUTANEOUS
  Administered 2018-05-18: 5 [IU] via SUBCUTANEOUS
  Administered 2018-05-18 (×3): 3 [IU] via SUBCUTANEOUS
  Administered 2018-05-18: 2 [IU] via SUBCUTANEOUS
  Administered 2018-05-18: 3 [IU] via SUBCUTANEOUS
  Administered 2018-05-18 – 2018-05-19 (×2): 2 [IU] via SUBCUTANEOUS
  Administered 2018-05-19: 5 [IU] via SUBCUTANEOUS
  Administered 2018-05-19: 2 [IU] via SUBCUTANEOUS
  Administered 2018-05-19: 8 [IU] via SUBCUTANEOUS
  Administered 2018-05-19: 17:00:00 5 [IU] via SUBCUTANEOUS
  Administered 2018-05-20 (×2): 3 [IU] via SUBCUTANEOUS
  Administered 2018-05-20: 5 [IU] via SUBCUTANEOUS
  Administered 2018-05-20: 8 [IU] via SUBCUTANEOUS
  Administered 2018-05-20: 13:00:00 5 [IU] via SUBCUTANEOUS
  Administered 2018-05-20: 3 [IU] via SUBCUTANEOUS
  Administered 2018-05-21: 5 [IU] via SUBCUTANEOUS
  Administered 2018-05-21: 3 [IU] via SUBCUTANEOUS
  Administered 2018-05-21: 5 [IU] via SUBCUTANEOUS
  Administered 2018-05-21 (×3): 3 [IU] via SUBCUTANEOUS
  Administered 2018-05-22 (×2): 5 [IU] via SUBCUTANEOUS
  Administered 2018-05-22: 3 [IU] via SUBCUTANEOUS
  Administered 2018-05-22: 5 [IU] via SUBCUTANEOUS
  Administered 2018-05-22 – 2018-05-23 (×5): 3 [IU] via SUBCUTANEOUS
  Administered 2018-05-23: 2 [IU] via SUBCUTANEOUS
  Administered 2018-05-23 (×3): 3 [IU] via SUBCUTANEOUS
  Administered 2018-05-24 (×2): 2 [IU] via SUBCUTANEOUS

## 2018-05-16 NOTE — Care Management Important Message (Signed)
Important Message  Patient Details  Name: [name] MRN: [id number] Date of Birth: 05-24-1945   Medicare Important Message Given:  Yes    Orbie Pyo 05/16/2018, 4:12 PM

## 2018-05-16 NOTE — Progress Notes (Signed)
Daily Progress Note   Patient Name: [name]       Date: 05/16/2018 DOB: 07/14/1945  Age: 73 y.o. MRN#: [id number] Attending Physician: Hosie Poisson, MD Primary Care Physician: Celene Squibb, MD Admit Date: 05/04/2018  Reason for Consultation/Follow-up: Establishing goals of care  Subjective: Patient will open eyes to voice but drowsy. Tell's me his name is [name] but does not answer orientation questions. Will not follow commands this AM on assessment.   GOC:   F/u GOC with niece, [name]. Niece was able to chat with him via elink chat this weekend. She shares that he did not recall short-term memory of fall or hospitalization, but was able to answer questions regarding memories from decades ago.  Discussed ongoing course of hospitalization including diagnoses, interventions, and plan of care. Discussed need for further nutritional support via feeding tube and poor participation with therapy.   I attempted to elicit values and goals of care important to the patient and family. [name] again shares with me how "dignified" her uncle is and that he would be mortified knowing his friends and family saw him in this current state. [name] tells me she has been talking with family and some of [name]'s friends, and does not believe he would wish for prolonged artificial nutritional or medical interventions if poor recovery or meaningful improvement/quality of life. [name] shares he would rather "die peacefully with dignity" then live prolonged in a nursing home.   The difference between aggressive medical intervention including PEG tube placement and comfort care was considered in light of the patient's goals of care. Introduced hospice philosophy and options. Explained shift to comfort focused care in  order to allow nature to take course and comfort/dignity at EOL.   [name] requests time to process this conversation before making a decision. She requests a call from Dr. Karleen Hampshire this evening if possible. Message relaying to Dr. Karleen Hampshire.   Electronic Hard Choices booklet sent to [name]. Questions and concerns were addressed.     Length of Stay: 12  Current Medications: Scheduled Meds:  . sodium chloride   Intravenous Once  . bacitracin   Topical BID  . chlorhexidine  15 mL Mouth Rinse BID  . feeding supplement (PRO-STAT SUGAR FREE 64)  30 mL Per Tube Daily  . folic acid  1 mg Per  Tube Daily  . Gerhardt's butt cream   Topical TID  . insulin aspart  0-15 Units Subcutaneous Q4H  . mouth rinse  15 mL Mouth Rinse q12n4p  . nicotine  14 mg Transdermal Daily  . pravastatin  20 mg Per Tube QHS  . QUEtiapine  12.5 mg Per Tube QHS  . thiamine  100 mg Per Tube Daily    Continuous Infusions: . feeding supplement (OSMOLITE 1.5 CAL) 1,000 mL (05/16/18 1312)    PRN Meds: acetaminophen **OR** acetaminophen, bisacodyl, calcium carbonate, haloperidol lactate, hydrALAZINE, insulin aspart, loperamide, loratadine, ondansetron **OR** ondansetron (ZOFRAN) IV, promethazine, sodium chloride flush, sodium phosphate  Physical Exam Vitals signs and nursing note reviewed.  Constitutional:      Appearance: He is ill-appearing.  HENT:     Head: Normocephalic and atraumatic.  Cardiovascular:     Rate and Rhythm: Normal rate.  Pulmonary:     Effort: No tachypnea, accessory muscle usage or respiratory distress.  Abdominal:     Tenderness: There is no abdominal tenderness.  Skin:    General: Skin is warm and dry.  Neurological:     Mental Status: He is lethargic.     Comments: Opens eyes intermittently to voice. Tells me his name is [name]. Following few commands with frequent request.  Psychiatric:        Attention and Perception: He is inattentive.        Speech: Speech is delayed.        Cognition and  Memory: Cognition is impaired.            Vital Signs: BP 116/71   Pulse 77   Temp 97.7 F (36.5 C) (Oral)   Resp 17   Ht 6' 0.52" (1.842 m)   Wt 89.2 kg   SpO2 95%   BMI 26.29 kg/m  SpO2: SpO2: 95 % O2 Device: O2 Device: Room Air O2 Flow Rate: O2 Flow Rate (L/min): 2 L/min  Intake/output summary:   Intake/Output Summary (Last 24 hours) at 05/16/2018 1508 Last data filed at 05/16/2018 1258 Gross per 24 hour  Intake -  Output 825 ml  Net -825 ml   LBM: Last BM Date: 05/15/18 Baseline Weight: Weight: 88.2 kg Most recent weight: Weight: 89.2 kg       Palliative Assessment/Data: PPS 30%    Flowsheet Rows     Most Recent Value  Intake Tab  Referral Department  Hospitalist  Unit at Time of Referral  Intermediate Care Unit  Palliative Care Primary Diagnosis  Neurology  Palliative Care Type  New Palliative care  Date first seen by Palliative Care  05/11/18  Clinical Assessment  Palliative Performance Scale Score  30%  Psychosocial & Spiritual Assessment  Palliative Care Outcomes  Patient/Family meeting held?  Yes  Who was at the meeting?  niece via telephone  Palliative Care Outcomes  Clarified goals of care, Provided end of life care assistance, Provided psychosocial or spiritual support, ACP counseling assistance      Patient Active Problem List   Diagnosis Date Noted  . Palliative care by specialist   . Goals of care, counseling/discussion   . Alcoholic intoxication with complication ([place])   . Thrombocytopenia ([place])   . SAH (subarachnoid hemorrhage) ([place])   . Agitation   . Respiratory distress 05/08/2018  . Alcohol abuse 05/08/2018  . Subdural hematoma ([place]) 05/04/2018  . Prostate cancer ([place]) 04/16/2017  . Malignant neoplasm of prostate ([place]) 01/07/2017  . Hepatic cirrhosis ([place]) 08/17/2013  . Diverticulosis 07/19/2013  .  DIARRHEA 08/22/2008  . COLITIS 08/21/2008  . DIVERTICULITIS, COLON 08/21/2008  . Diabetes mellitus type 2 in nonobese ([place])  08/20/2008  . HYPERLIPIDEMIA 08/20/2008  . SMOKER 08/20/2008  . Essential hypertension 08/20/2008  . EXTERNAL HEMORRHOIDS 08/20/2008  . COLONIC POLYPS, ADENOMATOUS, HX OF 08/20/2008    Palliative Care Assessment & Plan   Patient Profile: 73 y.o. male  with past medical history of DM, HLD, HTN, liver cirrhosis, prostate cancer, smoker, ETOH use admitted on 05/04/2018 after fall with alcohol intoxication. Initially admitted to neurosurgery with right intraparenchymal hematoma, subdural hematoma and C2 fracture. TRH consulted on 5/3 due to increased agitation and respiratory distress, felt related to alcohol withdrawal. Ativan initiated. Also noted to have hypoglycemia, pulmonary edema and pleural effusions. Repeat CT head 5/4 stable. Ongoing encephalopathy and agitation. Unable to work with SLP. Cortrak to be placed 5/6. Palliative medicine consultation for goals of care.   Assessment: Fall Right intraparenchymal hematoma Right subdural hematoma and possible intraventricular hemorrhage C2 fracture Acute encephalopathy ETOH abuse Fluid overload Liver cirrhosis Hypoalbuminemia Suspected COPD  Recommendations/Plan:  DNR/DNI  Continue current plan of care including cortrak/feeding tube.  F/u GOC with niece this afternoon. She is considering SNF/PEG placement versus comfort measures and hospice referral. Niece requests more time to consider options.  PMT to follow    Code Status: DNR   Code Status Orders  (From admission, onward)         Start     Ordered   05/10/18 1343  Do not attempt resuscitation (DNR)  Continuous    Question Answer Comment  In the event of cardiac or respiratory ARREST Do not call a "code blue"   In the event of cardiac or respiratory ARREST Do not perform Intubation, CPR, defibrillation or ACLS   In the event of cardiac or respiratory ARREST Use medication by any route, position, wound care, and other measures to relive pain and suffering. May use  oxygen, suction and manual treatment of airway obstruction as needed for comfort.      05/10/18 1343        Code Status History    Date Active Date Inactive Code Status Order ID Comments User Context   05/04/2018 0536 05/10/2018 1343 Full Code [id number]  Elpidio [facility]   04/16/2017 1617 04/17/2017 1901 Full Code [id number]  McKenzieCandee Furbish, MD Inpatient       Prognosis:   Unable to determine  Discharge Planning:  To Be Determined  Care plan was discussed with Dr Karleen Hampshire, niece  Thank you for allowing the Palliative Medicine Team to assist in the care of this patient.   Time In: 1050- 1445- Time Out: 1100 1515 Total Time 40 Prolonged Time Billed no      Greater than 50%  of this time was spent counseling and coordinating care related to the above assessment and plan.  Ihor Dow, DNP, FNP-C Palliative Medicine Team  Phone: [phone] Fax: [phone]  Please contact Palliative Medicine Team phone at [phone] for questions and concerns.

## 2018-05-16 NOTE — Progress Notes (Signed)
PROGRESS NOTE    [name]  IOX:[id number] DOB: 05-Jul-1945 DOA: 05/04/2018 PCP: Celene Squibb, MD    Brief Narrative:   [name] is a 73 year old male with history of metastatic prostate cancer, Liver cirrhosis, diabetes mellitus, hypertension, alcohol abuse, tobacco abuse was admitted to neurosurgery on 4/29 with right intraparenchymal hematoma, subdural hematoma and a C2 fracture. -He had been awaiting SNF placement, through his hospitalization he was noted to have increased confusion and agitation as well as respiratory distress. -TRH was consulted 5/3 due to increased agitation and respiratory distres -Also noted to have hypoglycemia, pulmonary edema and pleural effusions 5/4: TRH took over from [place] service   Assessment & Plan:   Principal Problem:   Subdural hematoma (HCC) Active Problems:   Diabetes mellitus type 2 in nonobese [facility])   Essential hypertension   Hepatic cirrhosis (HCC)   Prostate cancer ([place])   Respiratory distress   Alcohol abuse   Palliative care by specialist   Goals of care, counseling/discussion   Alcoholic intoxication with complication ([place])   Thrombocytopenia ([place])   SAH (subarachnoid hemorrhage) (HCC)   Agitation  Right intraparenchymal hematoma Extensive right subdural hematoma and possible intraventricular hemorrhage with traumatic C2 fracture following a fall. Patient was initially admitted by neurosurgery. Nonoperative management recommended by neurosurgery. Patient is currently on c-collar. Neurosurgery recommends patient is stable from their standpoint and transferred patient to [facility] service. No new acute issues.  Pt continues to be confused and lethargic, repeat CT head without contrast showed  Resolving intracerebral hemorrhage affecting the RIGHT temporal lobe.  Resolving LEFT-sided contusions/SDH.  Slight increase in RIGHT frontal extra-axial hematoma, predominantly low attenuation, 8 mm thickness. Continued surveillance  warranted.    Acute encephalopathy Differentials include alcohol withdrawal versus hospital delirium vs from the intracerebral bleeds.  Patient is currently on CIWA protocol. Repeat CT head is negative for acute pathology. Patient is alert but confused, he more alert today than the last 3 days  But very confused and restless.  Low-dose Seroquel was added at bedtime.  Core track to be placed and start tube feeds and palliative care consulted for goals of care and symptom management. [place] talks in progress.  Pt continues to be confused, .   Fluid overload secondary to liver cirrhosis and hypoalbuminemia CXR shows improvement.  Currently on RA and with good sats.  Patient is very euvolemic. COVID PCR is negative   Suspected COPD No wheezing heard    Diabetes mellitus CBG (last 3)  Recent Labs    05/16/18 0025 05/16/18 0430 05/16/18 0805  GLUCAP 204* 190* 199*   Continue with sliding scale insulin while inpatient  Start tube feeds once cortrak is placed.     hypertension Well-controlled    Metastatic prostate cancer S/p seed implants       DVT prophylaxis: SCD'S Code Status: DNR Family Communication: none at bedside. Palliative care spoke to the patient's family and updated. Discussed with the niece.  Disposition Plan:  Pending clinical improvement.    Consultants:   Neuro surgery. Dr Cato Mulligan  Palliative care     Procedures: none.    Antimicrobials: none.    Subjective: Continues to be confused.   Objective: Vitals:   05/15/18 2000 05/16/18 0000 05/16/18 0425 05/16/18 0810  BP: (!) 141/73 (!) 125/97 (!) 143/65 (!) 143/95  Pulse: 82 86 85 86  Resp: 16 (!) 25 19 16   Temp: 98.4 F (36.9 C) 98.8 F (37.1 C) 98.2 F (36.8 C) 99 F (37.2 C)  TempSrc: Oral Axillary Oral Axillary  SpO2: 96% 99% 96% 97%  Weight:   89.2 kg   Height:        Intake/Output Summary (Last 24 hours) at 05/16/2018 1106 Last data filed at 05/16/2018 0436 Gross  per 24 hour  Intake --  Output 625 ml  Net -625 ml   Filed Weights   05/08/18 0900 05/09/18 2300 05/16/18 0425  Weight: 88.2 kg 88.2 kg 89.2 kg    Examination:  General exam:  Alert and confused and restless.  Respiratory system: Clear to auscultation. Respiratory effort normal. No wheezing or rhonchi.  Cardiovascular system: S1 & S2 heard, RRR. Marland Kitchen No pedal edema. Gastrointestinal system: Abdomen is nondistended, soft . Normal bowel sounds heard. Central nervous system: alert , confused, restless, able to move all extremities.  Extremities: no pedal edema.  Skin: No rashes, lesions or ulcers Psychiatry: cannot be assessed.      Data Reviewed: I have personally reviewed following labs and imaging studies  CBC: Recent Labs  Lab 05/10/18 0343 05/11/18 0314  WBC 3.6* 3.5*  HGB 14.4 13.8  HCT 38.4* 37.2*  MCV 90.6 90.7  PLT 56* 62*   Basic Metabolic Panel: Recent Labs  Lab 05/11/18 0314 05/12/18 0912 05/14/18 0320 05/15/18 0245 05/16/18 0554  NA 136 136 139 138 137  K 3.6 3.6 3.5 3.7 4.4  CL 93* 96* 102 100 98  CO2 28 29 28 28 28   GLUCOSE 90 113* 111* 172* 179*  BUN 12 12 13 12 11   CREATININE 9.81 0.69 0.73 0.77 0.77  CALCIUM 8.3* 8.6* 8.5* 8.6* 8.5*  MG  --   --  1.6* 2.1 1.9  PHOS  --   --  2.8 3.0 3.5   GFR: Estimated Creatinine Clearance: 91.7 mL/min (by C-G formula based on SCr of 0.77 mg/dL). Liver Function Tests: Recent Labs  Lab 05/11/18 0314  AST 28  ALT 24  ALKPHOS 155*  BILITOT 3.6*  PROT 5.2*  ALBUMIN 2.6*   No results for input(s): LIPASE, AMYLASE in the last 168 hours. No results for input(s): AMMONIA in the last 168 hours. Coagulation Profile: No results for input(s): INR, PROTIME in the last 168 hours. Cardiac Enzymes: No results for input(s): CKTOTAL, CKMB, CKMBINDEX, TROPONINI in the last 168 hours. BNP (last 3 results) No results for input(s): PROBNP in the last 8760 hours. HbA1C: No results for input(s): HGBA1C in the last 72  hours. CBG: Recent Labs  Lab 05/15/18 1635 05/15/18 2222 05/16/18 0025 05/16/18 0430 05/16/18 0805  GLUCAP 167* 218* 204* 190* 199*   Lipid Profile: No results for input(s): CHOL, HDL, LDLCALC, TRIG, CHOLHDL, LDLDIRECT in the last 72 hours. Thyroid Function Tests: No results for input(s): TSH, T4TOTAL, FREET4, T3FREE, THYROIDAB in the last 72 hours. Anemia Panel: No results for input(s): VITAMINB12, FOLATE, FERRITIN, TIBC, IRON, RETICCTPCT in the last 72 hours. Sepsis Labs: No results for input(s): PROCALCITON, LATICACIDVEN in the last 168 hours.  Recent Results (from the past 240 hour(s))  SARS Coronavirus 2 (CEPHEID- Performed in [place] hospital lab), Hosp Order     Status: None   Collection Time: 05/08/18 11:30 AM  Result Value Ref Range Status   SARS Coronavirus 2 NEGATIVE NEGATIVE Final    Comment: (NOTE) If result is NEGATIVE SARS-CoV-2 target nucleic acids are NOT DETECTED. The SARS-CoV-2 RNA is generally detectable in upper and lower  respiratory specimens during the acute phase of infection. The lowest  concentration of SARS-CoV-2 viral copies this assay can  detect is 250  copies / mL. A negative result does not preclude SARS-CoV-2 infection  and should not be used as the sole basis for treatment or other  patient management decisions.  A negative result may occur with  improper specimen collection / handling, submission of specimen other  than nasopharyngeal swab, presence of viral mutation(s) within the  areas targeted by this assay, and inadequate number of viral copies  (<250 copies / mL). A negative result must be combined with clinical  observations, patient history, and epidemiological information. If result is POSITIVE SARS-CoV-2 target nucleic acids are DETECTED. The SARS-CoV-2 RNA is generally detectable in upper and lower  respiratory specimens dur ing the acute phase of infection.  Positive  results are indicative of active infection with  SARS-CoV-2.  Clinical  correlation with patient history and other diagnostic information is  necessary to determine patient infection status.  Positive results do  not rule out bacterial infection or co-infection with other viruses. If result is PRESUMPTIVE POSTIVE SARS-CoV-2 nucleic acids MAY BE PRESENT.   A presumptive positive result was obtained on the submitted specimen  and confirmed on repeat testing.  While 2019 novel coronavirus  (SARS-CoV-2) nucleic acids may be present in the submitted sample  additional confirmatory testing may be necessary for epidemiological  and / or clinical management purposes  to differentiate between  SARS-CoV-2 and other Sarbecovirus currently known to infect humans.  If clinically indicated additional testing with an alternate test  methodology [phone]) is advised. The SARS-CoV-2 RNA is generally  detectable in upper and lower respiratory sp ecimens during the acute  phase of infection. The expected result is Negative. Fact Sheet for Patients:  StrictlyIdeas.no Fact Sheet for Healthcare Providers: BankingDealers.co.za This test is not yet approved or cleared by the [place] FDA and has been authorized for detection and/or diagnosis of SARS-CoV-2 by FDA under an Emergency Use Authorization (EUA).  This EUA will remain in effect (meaning this test can be used) for the duration of the COVID-19 declaration under Section 564(b)(1) of the Act, 21 U.S.C. section 360bbb-3(b)(1), unless the authorization is terminated or revoked sooner. Performed at [facility], [place] [address], [place], [place] [address]          Radiology Studies: Ct Head Wo Contrast  Result Date: 05/16/2018 CLINICAL DATA:  Continued surveillance closed head injury. EXAM: CT HEAD WITHOUT CONTRAST TECHNIQUE: Contiguous axial images were obtained from the base of the skull through the vertex without intravenous contrast.  COMPARISON:  CT head 05/09/2018. FINDINGS: Brain: Resolving intra cerebral hemorrhage affecting the RIGHT temporal lobe, with surrounding edema. Intraventricular blood is diminishing. No significant midline shift. Small LEFT-sided contusions/SAH are improved. There is an extra-axial hematoma on the RIGHT, low attenuation, primarily frontal, slightly increased, measuring 8 mm thickness on series 5, image 22. Continued surveillance warranted. Vascular: Calcification of the cavernous internal carotid arteries consistent with cerebrovascular atherosclerotic disease. No signs of intracranial large vessel occlusion. Skull: Calvarium intact. Sinuses/Orbits: Opacification of the sphenoid and LEFT maxillary sinuses. Other: None. IMPRESSION: 1. Resolving intracerebral hemorrhage affecting the RIGHT temporal lobe. 2. Resolving LEFT-sided contusions/SDH. 3. Slight increase in RIGHT frontal extra-axial hematoma, predominantly low attenuation, 8 mm thickness. Continued surveillance warranted. Electronically Signed   By: Staci Righter M.D.   On: 05/16/2018 08:18        Scheduled Meds:  sodium chloride   Intravenous Once   bacitracin   Topical BID   chlorhexidine  15 mL Mouth Rinse BID  feeding supplement (PRO-STAT SUGAR FREE 64)  30 mL Per Tube Daily   folic acid  1 mg Per Tube Daily   Gerhardt's butt cream   Topical TID   insulin aspart  0-15 Units Subcutaneous Q4H   mouth rinse  15 mL Mouth Rinse q12n4p   nicotine  14 mg Transdermal Daily   pravastatin  20 mg Per Tube QHS   QUEtiapine  12.5 mg Per Tube QHS   thiamine  100 mg Per Tube Daily   Continuous Infusions:  feeding supplement (OSMOLITE 1.5 CAL) 1,000 mL (05/15/18 2007)     LOS: 12 days    Time spent: 25 minutes.     Hosie Poisson, MD [facility] Pager [phone]  If 7PM-7AM, please contact night-coverage [email or web address] Password [email or web address] 05/16/2018, 11:06 AM

## 2018-05-16 NOTE — Progress Notes (Signed)
  Speech Language Pathology Treatment: Dysphagia  Patient Details Name: [name] MRN: [id number] DOB: 21-Mar-1945 Today's Date: 05/16/2018 Time: 8338-2505 SLP Time Calculation (min) (ACUTE ONLY): 13 min  Assessment / Plan / Recommendation Clinical Impression  Much of last week [name] was significantly lethargic and unable to follow commands or adequately sustain alertness for food/liquid trials/exercises to facilitate swallow. He had Ativan and Haldol frequently for restlessness/possible agitation?. During PT's session earlier this afternoon he was able to attend and participate. Today with this therapist he is improving and able to speak to SLP, follow commands during oral care and keep eyes open for longer periods but falling asleep every 60 seconds- needed Haldol this am for CT. Briefly attempted to manipulate ice chips but continued to fall asleep and oral cavity suctioned. He is improving and hopefully will not need Haldol (PRN) and can undergo repeat MBS to evaluate current swallow function.      HPI HPI: [name] is a 72 y.o. male with history of diabetes, GERD, hyperlipidemia, hypertension, prostate cancer, cirrhosis who was brought to ED after a fall. He underwent CT head/C spine which is significant for an acute right SDH and C2 fracture. Repeat CT 4/30 revealed worsening findings: Some increased blooming of the right temporal intraparenchymal hematoma, measuring 5 x 3.3 x 2.2 cm today compared with 4.4 x 2.7 x 1.9 cm on the immediate previous film. Slight increase in size of the left frontoparietal vertex contusions, increased by a mm or 2, measuring 7 and 10 mm presently. CXR No acute cardiopulmonary disease.  Pt underwent clinical swallow eval with recommendation for MBS and npo x ice chips/medicine with applesauce. RN reports pt is coughing with applesauce and is concerned for aspiration.  MBS indicated. Pt has not had surgery and is in a cervical collar.         SLP Plan  Continue with current plan of care       Recommendations  Diet recommendations: NPO Medication Administration: Via alternative means                Oral Care Recommendations: Oral care QID Follow up Recommendations: Skilled Nursing facility SLP Visit Diagnosis: Dysphagia, oropharyngeal phase (R13.12) Plan: Continue with current plan of care       GO                [name] 05/16/2018, 3:20 PM  [name].Ed Risk analyst [phone] Office [phone]

## 2018-05-16 NOTE — Progress Notes (Signed)
Physical Therapy Treatment Patient Details Name: [name] MRN: [id number] DOB: 08/31/1945 Today's Date: 05/16/2018    History of Present Illness [name] is a 73 y.o. male With history of diabetes, hyperlipidemia, hypertension, prostate cancer, cirrhosis who was brought to ED after a fall. By report he was drinking with a friend when he fell forward. Patient is amnestic to events surrounding the fall. He underwent CT head/C spine which is significant for an acute right SDH and C2 fracture (managed with Aspen collar).    PT Comments    Patient seen for therapy progression with OT. Patient much more alert and able to engage with therapy staff today. Tolerated EOB, OOB in room ambulation, and functional task performance. Patient with significant delays in cognition and ability to attend to task performance in addition to heavy reliance on external physical assist. Current POC remains appropriate.   Follow Up Recommendations  SNF;Supervision/Assistance - 24 hour     Equipment Recommendations  Other (comment)(tbd)    Recommendations for Other Services       Precautions / Restrictions Precautions Precautions: Fall;Cervical Precaution Booklet Issued: No Precaution Comments: belt restraint and B posey mitts Required Braces or Orthoses: Cervical Brace Cervical Brace: Hard collar;At all times Restrictions Weight Bearing Restrictions: No    Mobility  Bed Mobility Overal bed mobility: Needs Assistance Bed Mobility: Rolling;Sidelying to Sit Rolling: Max assist;+2 for safety/equipment Sidelying to sit: Max assist;+2 for physical assistance Supine to sit: +2 for physical assistance;HOB elevated;Max assist;+2 for safety/equipment Sit to supine: Mod assist;+2 for physical assistance;+2 for safety/equipment   General bed mobility comments: Pt able to move legs voluntarily but does not move to commands with hand over hand placement he does grip railing.  He requires total +2  for all aspects to advance LEs and elevate trunk into sitting.  Pt is slow and not responsive to commands.  Once seated edge of bed presents with posterior lean and pulling knees to chest.  Required assistance to froward weight shift and ground feet to floor.    Transfers Overall transfer level: Needs assistance Equipment used: Ambulation equipment used Transfers: Sit to/from Stand Sit to Stand: +2 physical assistance;+2 safety/equipment;Mod assist         General transfer comment: 2 person hand held assist to power up and provide stability in standing, performed from bed and chair x2  Ambulation/Gait Ambulation/Gait assistance: Mod assist;+2 physical assistance;Max assist Gait Distance (Feet): 16 Feet Assistive device: 2 person hand held assist Gait Pattern/deviations: Step-through pattern;Decreased stride length;Shuffle Gait velocity: decreased Gait velocity interpretation: <1.31 ft/sec, indicative of household ambulator General Gait Details: Heavy relaince on external assist, ataxic and poor ability to maintain upright positioning,    Stairs             Wheelchair Mobility    Modified Rankin (Stroke Patients Only)       Balance Overall balance assessment: Needs assistance Sitting-balance support: No upper extremity supported;Feet supported Sitting balance-Leahy Scale: Zero Sitting balance - Comments: posterior lean with absent B LE support pulling knees to chest   Standing balance support: Bilateral upper extremity supported Standing balance-Leahy Scale: Poor Standing balance comment: posterior lean                            Cognition Arousal/Alertness: Awake/alert Behavior During Therapy: Flat affect Overall Cognitive Status: Impaired/Different from baseline Area of Impairment: Orientation;Attention;Following commands;Safety/judgement;Awareness;Problem solving  Orientation Level: Disoriented to;Place;Time;Situation Current  Attention Level: Focused   Following Commands: Follows one step commands with increased time Safety/Judgement: Decreased awareness of safety Awareness: Intellectual Problem Solving: Slow processing;Decreased initiation;Difficulty sequencing;Requires verbal cues;Requires tactile cues        Exercises      General Comments        Pertinent Vitals/Pain Pain Assessment: Faces Faces Pain Scale: Hurts even more(responds to noxious stimuli but otherwise no signs of pain) Pain Location: generalized but unable to indicate Pain Descriptors / Indicators: Grimacing;Discomfort Pain Intervention(s): Monitored during session    Home Living Family/patient expects to be discharged to:: Skilled nursing facility Living Arrangements: Alone                  Prior Function Level of Independence: Independent          PT Goals (current goals can now be found in the care plan section) Acute Rehab PT Goals Patient Stated Goal: none stated PT Goal Formulation: With patient Time For Goal Achievement: 05/20/18 Potential to Achieve Goals: Fair Progress towards PT goals: Progressing toward goals    Frequency    Min 3X/week      PT Plan Current plan remains appropriate    Co-evaluation PT/OT/SLP Co-Evaluation/Treatment: Yes            AM-PAC PT "6 Clicks" Mobility   Outcome Measure  Help needed turning from your back to your side while in a flat bed without using bedrails?: A Lot Help needed moving from lying on your back to sitting on the side of a flat bed without using bedrails?: A Lot Help needed moving to and from a bed to a chair (including a wheelchair)?: A Lot Help needed standing up from a chair using your arms (e.g., wheelchair or bedside chair)?: A Lot Help needed to walk in hospital room?: A Lot Help needed climbing 3-5 steps with a railing? : Total 6 Click Score: 11    End of Session Equipment Utilized During Treatment: Cervical collar Activity Tolerance:  Patient limited by fatigue;Patient limited by lethargy Patient left: in bed;with call bell/phone within reach;with bed alarm set;with restraints reapplied Nurse Communication: Mobility status PT Visit Diagnosis: Unsteadiness on feet (R26.81);Other abnormalities of gait and mobility (R26.89);History of falling (Z91.81);Muscle weakness (generalized) (M62.81);Difficulty in walking, not elsewhere classified (R26.2)     Time: 3154-0086 PT Time Calculation (min) (ACUTE ONLY): 26 min  Charges:  $Therapeutic Activity: 8-22 mins                     Alben Deeds, PT DPT  Board Certified Neurologic Specialist [place] Pager [phone] Office [place] 05/16/2018, 2:20 PM

## 2018-05-16 NOTE — Care Management Important Message (Signed)
Important Message  Patient Details  Name: [name] MRN: [id number] Date of Birth: 17-Nov-1945   Medicare Important Message Given:  Yes    [name] [address] 05/16/2018, 3:59 PM

## 2018-05-16 NOTE — Progress Notes (Signed)
Occupational Therapy Treatment Patient Details Name: [name] MRN: [id number] DOB: 1945/06/14 Today's Date: 05/16/2018    History of present illness [name] is a 73 y.o. male With history of diabetes, hyperlipidemia, hypertension, prostate cancer, cirrhosis who was brought to ED after a fall. By report he was drinking with a friend when he fell forward. Patient is amnestic to events surrounding the fall. He underwent CT head/C spine which is significant for an acute right SDH and C2 fracture (managed with Aspen collar).   OT comments  Pt with increased arousal levels today and able to increase participation with therapy. Pt requiring modA+2 for room level mobility (HHA) to bathroom. Seated in bathroom pt requiring max hand over hand assist to initiate and carry out simple grooming tasks. Pt following approx 50% of simple commands during session, given intermittent multimodal cues. Pt not verbalizing much but does respond occasionally to yes/no questions. Feel SNF recommendation remains appropriate at this time. Will continue to follow acutely.   Follow Up Recommendations  SNF;Supervision/Assistance - 24 hour    Equipment Recommendations  Other (comment)(TBA)    Recommendations for Other Services      Precautions / Restrictions Precautions Precautions: Fall;Cervical Precaution Booklet Issued: No Precaution Comments: belt restraint and B posey mitts; NG tube Required Braces or Orthoses: Cervical Brace Cervical Brace: Hard collar;At all times Restrictions Weight Bearing Restrictions: No       Mobility Bed Mobility Overal bed mobility: Needs Assistance Bed Mobility: Rolling;Sidelying to Sit Rolling: Max assist;+2 for safety/equipment Sidelying to sit: Max assist;+2 for physical assistance Supine to sit: +2 for physical assistance;HOB elevated;Max assist;+2 for safety/equipment Sit to supine: Mod assist;+2 for physical assistance;+2 for safety/equipment    General bed mobility comments: pt requiring assist for LEs towards EOB and faciliation to reach with LUE towards rail; assist for trunk elevation and to stabilize initially once upright  Transfers Overall transfer level: Needs assistance Equipment used: Ambulation equipment used Transfers: Sit to/from Stand Sit to Stand: +2 physical assistance;+2 safety/equipment;Mod assist         General transfer comment: 2 person hand held assist to power up and provide stability in standing, performed from bed and chair x2    Balance Overall balance assessment: Needs assistance Sitting-balance support: No upper extremity supported;Feet supported Sitting balance-Leahy Scale: Zero Sitting balance - Comments: posterior lean with absent B LE support pulling knees to chest   Standing balance support: Bilateral upper extremity supported Standing balance-Leahy Scale: Poor Standing balance comment: posterior lean                           ADL either performed or assessed with clinical judgement   ADL Overall ADL's : Needs assistance/impaired Eating/Feeding: NPO Eating/Feeding Details (indicate cue type and reason): now with NG Grooming: Wash/dry face;Brushing hair;Maximal assistance;Sitting Grooming Details (indicate cue type and reason): max hand over hand assist to initiate, pt will take over performing task on his own but requires cues for thoroughness and to continue for more than a few seconds; intermittently requires stabilization at elbow to maintain proper UE positioning to reach face and hair; performed while seated on chair in bathroom             Lower Body Dressing: Total assistance Lower Body Dressing Details (indicate cue type and reason): to don socks at bed level             Functional mobility during ADLs: Moderate assistance;+2 for physical  assistance;+2 for safety/equipment General ADL Comments: pt able to perform mobility in room today towards bathroom with  heavy modA+2; seated in bathroom focused on simple ADL tasks; pt continues with limitations due to pain, impaired cognition, weakness, and decreased balance     Vision       Perception     Praxis      Cognition Arousal/Alertness: Awake/alert Behavior During Therapy: Flat affect Overall Cognitive Status: Impaired/Different from baseline Area of Impairment: Orientation;Attention;Following commands;Safety/judgement;Awareness;Problem solving                 Orientation Level: Disoriented to;Place;Time;Situation Current Attention Level: Focused   Following Commands: Follows one step commands with increased time Safety/Judgement: Decreased awareness of safety Awareness: Intellectual Problem Solving: Slow processing;Decreased initiation;Difficulty sequencing;Requires verbal cues;Requires tactile cues General Comments: pt follows some simple commands (approx 25-50%) given multimodal cues; responds with some yes and no questions        Exercises     Shoulder Instructions       General Comments VSS, pt on RA today    Pertinent Vitals/ Pain       Pain Assessment: Faces Faces Pain Scale: Hurts even more Pain Location: generalized but unable to indicate Pain Descriptors / Indicators: Grimacing;Discomfort Pain Intervention(s): Monitored during session;Repositioned  Home Living Family/patient expects to be discharged to:: Skilled nursing facility Living Arrangements: Alone                                      Prior Functioning/Environment Level of Independence: Independent            Frequency  Min 2X/week        Progress Toward Goals  OT Goals(current goals can now be found in the care plan section)  Progress towards OT goals: Progressing toward goals  Acute Rehab OT Goals Patient Stated Goal: none stated OT Goal Formulation: With patient Time For Goal Achievement: 05/20/18 Potential to Achieve Goals: Good  Plan Discharge plan remains  appropriate    Co-evaluation    PT/OT/SLP Co-Evaluation/Treatment: Yes Reason for Co-Treatment: Complexity of the patient's impairments (multi-system involvement);Necessary to address cognition/behavior during functional activity;For patient/therapist safety;To address functional/ADL transfers          AM-PAC OT "6 Clicks" Daily Activity     Outcome Measure   Help from another person eating meals?: Total(NPO) Help from another person taking care of personal grooming?: A Lot Help from another person toileting, which includes using toliet, bedpan, or urinal?: Total Help from another person bathing (including washing, rinsing, drying)?: Total Help from another person to put on and taking off regular upper body clothing?: Total Help from another person to put on and taking off regular lower body clothing?: Total 6 Click Score: 7    End of Session Equipment Utilized During Treatment: Gait belt  OT Visit Diagnosis: Unsteadiness on feet (R26.81);Other abnormalities of gait and mobility (R26.89);Muscle weakness (generalized) (M62.81);History of falling (Z91.81);Other symptoms and signs involving cognitive function   Activity Tolerance Patient tolerated treatment well   Patient Left in bed;with call bell/phone within reach;with bed alarm set;with restraints reapplied   Nurse Communication Mobility status        Time: 1829-9371 OT Time Calculation (min): 25 min  Charges: OT General Charges $OT Visit: 1 Visit OT Treatments $Self Care/Home Management : 8-22 mins  Lou Cal, OT [facility] Pager [phone] Office [phone]    Raymondo Band  05/16/2018, 2:47 PM

## 2018-05-17 LAB — PHOSPHORUS: Phosphorus: 3.8 mg/dL (ref 2.5–4.6)

## 2018-05-17 LAB — GLUCOSE, CAPILLARY
Glucose-Capillary: 148 mg/dL — ABNORMAL HIGH (ref 70–99)
Glucose-Capillary: 148 mg/dL — ABNORMAL HIGH (ref 70–99)
Glucose-Capillary: 150 mg/dL — ABNORMAL HIGH (ref 70–99)
Glucose-Capillary: 155 mg/dL — ABNORMAL HIGH (ref 70–99)
Glucose-Capillary: 171 mg/dL — ABNORMAL HIGH (ref 70–99)
Glucose-Capillary: 200 mg/dL — ABNORMAL HIGH (ref 70–99)
Glucose-Capillary: 201 mg/dL — ABNORMAL HIGH (ref 70–99)

## 2018-05-17 LAB — BASIC METABOLIC PANEL
Anion gap: 12 (ref 5–15)
BUN: 14 mg/dL (ref 8–23)
CO2: 29 mmol/L (ref 22–32)
Calcium: 8.8 mg/dL — ABNORMAL LOW (ref 8.9–10.3)
Chloride: 96 mmol/L — ABNORMAL LOW (ref 98–111)
Creatinine, Ser: 0.83 mg/dL (ref 0.61–1.24)
GFR calc Af Amer: >60 mL/min (ref >60)
GFR calc non Af Amer: >60 mL/min (ref >60)
Glucose, Bld: 174 mg/dL — ABNORMAL HIGH (ref 70–99)
Potassium: 4.8 mmol/L (ref 3.5–5.1)
Sodium: 137 mmol/L (ref 135–145)

## 2018-05-17 LAB — MAGNESIUM: Magnesium: 1.9 mg/dL (ref 1.7–2.4)

## 2018-05-17 MED ORDER — QUETIAPINE FUMARATE 50 MG PO TABS
25.0000 mg | ORAL_TABLET | Freq: Every day | ORAL | Status: DC
  Administered 2018-05-17 – 2018-05-20 (×3): 25 mg
  Filled 2018-05-17 (×3): qty 1

## 2018-05-17 NOTE — Progress Notes (Signed)
PROGRESS NOTE    [name]  JHE:[id number] DOB: 1945/02/13 DOA: 05/04/2018 PCP: Celene Squibb, MD    Brief Narrative:   [name] is a 74 year old male with history of metastatic prostate cancer, Liver cirrhosis, diabetes mellitus, hypertension, alcohol abuse, tobacco abuse was admitted to neurosurgery on 4/29 with right intraparenchymal hematoma, subdural hematoma and a C2 fracture. -He had been awaiting SNF placement, through his hospitalization he was noted to have increased confusion and agitation as well as respiratory distress. -TRH was consulted 5/3 due to increased agitation and respiratory distres -Also noted to have hypoglycemia, pulmonary edema and pleural effusions 5/4: TRH took over from [place] service   Assessment & Plan:   Principal Problem:   Subdural hematoma (HCC) Active Problems:   Diabetes mellitus type 2 in nonobese [facility])   Essential hypertension   Hepatic cirrhosis (HCC)   Prostate cancer ([place])   Respiratory distress   Alcohol abuse   Palliative care by specialist   Goals of care, counseling/discussion   Alcoholic intoxication with complication ([place])   Thrombocytopenia ([place])   SAH (subarachnoid hemorrhage) (HCC)   Agitation  Right intraparenchymal hematoma Extensive right subdural hematoma and possible intraventricular hemorrhage with traumatic C2 fracture following a fall. Patient was initially admitted by neurosurgery. Nonoperative management recommended by neurosurgery. Patient is currently on c-collar. Neurosurgery recommends patient is stable from their standpoint and transferred patient to [facility] service. No new acute issues.  Pt continues to be confused and lethargic, repeat CT head without contrast showed  Resolving intracerebral hemorrhage affecting the RIGHT temporal lobe.  Resolving LEFT-sided contusions/SDH.  Slight increase in RIGHT frontal extra-axial hematoma, predominantly low attenuation, 8 mm thickness. Continued surveillance  warranted.    Acute encephalopathy Differentials include alcohol withdrawal versus hospital delirium vs from the intracerebral bleeds.  Patient is currently on CIWA protocol. Repeat CT head is negative for acute pathology. Patient is alert but confused, he more alert today than the last 3 days  But very confused and restless.  Low-dose Seroquel was added at bedtime,will be increased to 25 mg at bedtime.  Prn haldol. No ativan.  Cortrak to be placed and start tube feeds and palliative care consulted for goals of care and symptom management. [place]yl[place] talks in progress. Patient's niece who is the next of kin, plans to make the decision after MBS tomorrow.     Fluid overload secondary to liver cirrhosis and hypoalbuminemia CXR shows improvement.  Currently on RA and with good sats.  Patient is very euvolemic. COVID PCR is negative   Suspected COPD No wheezing heard    Diabetes mellitus CBG (last 3)  Recent Labs    05/17/18 1222 05/17/18 1230 05/17/18 1524  GLUCAP 201* 200* 148*   Continue with sliding scale insulin while inpatient  Start tube feeds once cortrak is placed.     hypertension Well-controlled    Metastatic prostate cancer S/p seed implants   DVT prophylaxis: SCD'S Code Status: DNR Family Communication: none at bedside. Palliative care spoke to the patient's family and updated. Discussed with the niece.  Disposition Plan:  Pending clinical improvement.    Consultants:   Neuro surgery. Dr Cato Mulligan  Palliative care     Procedures: none.    Antimicrobials: none.    Subjective: Continues to be confused, restless.   Objective: Vitals:   05/16/18 2307 05/17/18 0300 05/17/18 0500 05/17/18 0750  BP:    138/90  Pulse:  82  92  Resp:  18  (!) 22  Temp: 98.1  F (36.7 C) 98.2 F (36.8 C)  (!) 97 F (36.1 C)  TempSrc: Oral Oral    SpO2:  97%  98%  Weight:   84.3 kg   Height:        Intake/Output Summary (Last 24 hours) at 05/17/2018  1717 Last data filed at 05/17/2018 0300 Gross per 24 hour  Intake --  Output 450 ml  Net -450 ml   Filed Weights   05/09/18 2300 05/16/18 0425 05/17/18 0500  Weight: 88.2 kg 89.2 kg 84.3 kg    Examination:  General exam:  Alert and confused and restless. With restraints.  Respiratory system: Clear to auscultation. Respiratory effort normal. No wheezing or rhonchi.  Cardiovascular system: S1 & S2 heard, RRR. Marland Kitchen No pedal edema. Gastrointestinal system: Abdomen is nondistended, soft . Normal bowel sounds heard. Central nervous system: alert , confused, restless, able to move all extremities.  Extremities: no pedal edema.  Skin: No rashes, lesions or ulcers Psychiatry: cannot be assessed.      Data Reviewed: I have personally reviewed following labs and imaging studies  CBC: Recent Labs  Lab 05/11/18 0314  WBC 3.5*  HGB 13.8  HCT 37.2*  MCV 90.7  PLT 62*   Basic Metabolic Panel: Recent Labs  Lab 05/12/18 0912 05/14/18 0320 05/15/18 0245 05/16/18 0554 05/17/18 0158  NA 136 139 138 137 137  K 3.6 3.5 3.7 4.4 4.8  CL 96* 102 100 98 96*  CO2 29 28 28 28 29   GLUCOSE 113* 111* 172* 179* 174*  BUN 12 13 12 11 14   CREATININE 0.69 0.73 0.77 0.77 0.83  CALCIUM 8.6* 8.5* 8.6* 8.5* 8.8*  MG  --  1.6* 2.1 1.9 1.9  PHOS  --  2.8 3.0 3.5 3.8   GFR: Estimated Creatinine Clearance: 88.3 mL/min (by C-G formula based on SCr of 0.83 mg/dL). Liver Function Tests: Recent Labs  Lab 05/11/18 0314  AST 28  ALT 24  ALKPHOS 155*  BILITOT 3.6*  PROT 5.2*  ALBUMIN 2.6*   No results for input(s): LIPASE, AMYLASE in the last 168 hours. No results for input(s): AMMONIA in the last 168 hours. Coagulation Profile: No results for input(s): INR, PROTIME in the last 168 hours. Cardiac Enzymes: No results for input(s): CKTOTAL, CKMB, CKMBINDEX, TROPONINI in the last 168 hours. BNP (last 3 results) No results for input(s): PROBNP in the last 8760 hours. HbA1C: No results for  input(s): HGBA1C in the last 72 hours. CBG: Recent Labs  Lab 05/17/18 0327 05/17/18 0728 05/17/18 1222 05/17/18 1230 05/17/18 1524  GLUCAP 155* 150* 201* 200* 148*   Lipid Profile: No results for input(s): CHOL, HDL, LDLCALC, TRIG, CHOLHDL, LDLDIRECT in the last 72 hours. Thyroid Function Tests: No results for input(s): TSH, T4TOTAL, FREET4, T3FREE, THYROIDAB in the last 72 hours. Anemia Panel: No results for input(s): VITAMINB12, FOLATE, FERRITIN, TIBC, IRON, RETICCTPCT in the last 72 hours. Sepsis Labs: No results for input(s): PROCALCITON, LATICACIDVEN in the last 168 hours.  Recent Results (from the past 240 hour(s))  SARS Coronavirus 2 (CEPHEID- Performed in [place] hospital lab), Hosp Order     Status: None   Collection Time: 05/08/18 11:30 AM  Result Value Ref Range Status   SARS Coronavirus 2 NEGATIVE NEGATIVE Final    Comment: (NOTE) If result is NEGATIVE SARS-CoV-2 target nucleic acids are NOT DETECTED. The SARS-CoV-2 RNA is generally detectable in upper and lower  respiratory specimens during the acute phase of infection. The lowest  concentration of SARS-CoV-2  viral copies this assay can detect is 250  copies / mL. A negative result does not preclude SARS-CoV-2 infection  and should not be used as the sole basis for treatment or other  patient management decisions.  A negative result may occur with  improper specimen collection / handling, submission of specimen other  than nasopharyngeal swab, presence of viral mutation(s) within the  areas targeted by this assay, and inadequate number of viral copies  (<250 copies / mL). A negative result must be combined with clinical  observations, patient history, and epidemiological information. If result is POSITIVE SARS-CoV-2 target nucleic acids are DETECTED. The SARS-CoV-2 RNA is generally detectable in upper and lower  respiratory specimens dur ing the acute phase of infection.  Positive  results are indicative  of active infection with SARS-CoV-2.  Clinical  correlation with patient history and other diagnostic information is  necessary to determine patient infection status.  Positive results do  not rule out bacterial infection or co-infection with other viruses. If result is PRESUMPTIVE POSTIVE SARS-CoV-2 nucleic acids MAY BE PRESENT.   A presumptive positive result was obtained on the submitted specimen  and confirmed on repeat testing.  While 2019 novel coronavirus  (SARS-CoV-2) nucleic acids may be present in the submitted sample  additional confirmatory testing may be necessary for epidemiological  and / or clinical management purposes  to differentiate between  SARS-CoV-2 and other Sarbecovirus currently known to infect humans.  If clinically indicated additional testing with an alternate test  methodology [phone]) is advised. The SARS-CoV-2 RNA is generally  detectable in upper and lower respiratory sp ecimens during the acute  phase of infection. The expected result is Negative. Fact Sheet for Patients:  StrictlyIdeas.no Fact Sheet for Healthcare Providers: BankingDealers.co.za This test is not yet approved or cleared by the [place] FDA and has been authorized for detection and/or diagnosis of SARS-CoV-2 by FDA under an Emergency Use Authorization (EUA).  This EUA will remain in effect (meaning this test can be used) for the duration of the COVID-19 declaration under Section 564(b)(1) of the Act, 21 U.S.C. section 360bbb-3(b)(1), unless the authorization is terminated or revoked sooner. Performed at [facility], [place] [address] [place], [place] [address]          Radiology Studies: Ct Head Wo Contrast  Result Date: 05/16/2018 CLINICAL DATA:  Continued surveillance closed head injury. EXAM: CT HEAD WITHOUT CONTRAST TECHNIQUE: Contiguous axial images were obtained from the base of the skull through the vertex without  intravenous contrast. COMPARISON:  CT head 05/09/2018. FINDINGS: Brain: Resolving intra cerebral hemorrhage affecting the RIGHT temporal lobe, with surrounding edema. Intraventricular blood is diminishing. No significant midline shift. Small LEFT-sided contusions/SAH are improved. There is an extra-axial hematoma on the RIGHT, low attenuation, primarily frontal, slightly increased, measuring 8 mm thickness on series 5, image 22. Continued surveillance warranted. Vascular: Calcification of the cavernous internal carotid arteries consistent with cerebrovascular atherosclerotic disease. No signs of intracranial large vessel occlusion. Skull: Calvarium intact. Sinuses/Orbits: Opacification of the sphenoid and LEFT maxillary sinuses. Other: None. IMPRESSION: 1. Resolving intracerebral hemorrhage affecting the RIGHT temporal lobe. 2. Resolving LEFT-sided contusions/SDH. 3. Slight increase in RIGHT frontal extra-axial hematoma, predominantly low attenuation, 8 mm thickness. Continued surveillance warranted. Electronically Signed   By: Staci Righter M.D.   On: 05/16/2018 08:18        Scheduled Meds:  sodium chloride   Intravenous Once   bacitracin   Topical BID   chlorhexidine  15 mL  Mouth Rinse BID   feeding supplement (PRO-STAT SUGAR FREE 64)  30 mL Per Tube Daily   folic acid  1 mg Per Tube Daily   Gerhardt's butt cream   Topical TID   insulin aspart  0-15 Units Subcutaneous Q4H   mouth rinse  15 mL Mouth Rinse q12n4p   nicotine  14 mg Transdermal Daily   pravastatin  20 mg Per Tube QHS   QUEtiapine  12.5 mg Per Tube QHS   thiamine  100 mg Per Tube Daily   Continuous Infusions:  feeding supplement (OSMOLITE 1.5 CAL) 1,000 mL (05/16/18 1312)     LOS: 13 days    Time spent: 25 minutes.     Hosie Poisson, MD [facility] Pager [phone]  If 7PM-7AM, please contact night-coverage [email or web address] Password [facility] 05/17/2018, 5:17 PM

## 2018-05-17 NOTE — Progress Notes (Signed)
  Speech Language Pathology Treatment: Dysphagia  Patient Details Name: [name] MRN: [id number] DOB: [birth year] Today's Date: 05/17/2018 Time: 0811-0825 SLP Time Calculation (min) (ACUTE ONLY): 14 min  Assessment / Plan / Recommendation Clinical Impression  Skilled treatment session focused on dysphagia goals. SLP received pt in bed awake and alert. Pt is responsive to his name, however he is not able to state his name. He verbalizes nonspecific information at the simple phrase level. SLP removed mitts to functional participation in oral care. Pt with fluctuating ability to maintain alertness for ~ 1 minute periods. He was unable to follow commands required Max A for oral care. After oral care, pt's ability to remain alert faded and as a result pt was appropriate for PO trials. SLP replaced pt's mittens and left pt asleep in bed with all restraints and bed alarm on.    HPI HPI: [name] is a 73 y.o. male with history of diabetes, GERD, hyperlipidemia, hypertension, prostate cancer, cirrhosis who was brought to ED after a fall. He underwent CT head/C spine which is significant for an acute right SDH and C2 fracture. Repeat CT 4/30 revealed worsening findings: Some increased blooming of the right temporal intraparenchymal hematoma, measuring 5 x 3.3 x 2.2 cm today compared with 4.4 x 2.7 x 1.9 cm on the immediate previous film. Slight increase in size of the left frontoparietal vertex contusions, increased by a mm or 2, measuring 7 and 10 mm presently. CXR No acute cardiopulmonary disease.  Pt underwent clinical swallow eval with recommendation for MBS and npo x ice chips/medicine with applesauce. RN reports pt is coughing with applesauce and is concerned for aspiration.  MBS indicated. Pt has not had surgery and is in a cervical collar.        SLP Plan  Continue with current plan of care       Recommendations  Diet recommendations: NPO Medication Administration: Via  alternative means                Oral Care Recommendations: Oral care QID Follow up Recommendations: Skilled Nursing facility SLP Visit Diagnosis: Dysphagia, oropharyngeal phase (R13.12) Plan: Continue with current plan of care       GO                Milani Lowenstein 05/17/2018, 8:34 AM

## 2018-05-17 NOTE — Progress Notes (Signed)
Daily Progress Note   Patient Name: [name]       Date: 05/17/2018 DOB: [birth year]  Age: 73 y.o. MRN#: [id number] Attending Physician: Hosie Poisson, MD Primary Care Physician: Celene Squibb, MD Admit Date: 05/04/2018  Reason for Consultation/Follow-up: Establishing goals of care  Subjective: Upon arrival to room, patient awake and alert. Oriented to self, otherwise disoriented. He is following commands and stays awake for 30+ minutes that this NP was at bedside. [name] states "don't leave me" and tells me he is "afraid." Denies pain and does not appear to be in distress or discomfort. Asking for a coke.   Attempted to re-orient patient to time, place, situation.   GOC:  F/u with niece, [name] via telephone. She is very appreciative of call from Dr. Karleen Hampshire yesterday evening regarding plan of care. [name] struggles with making a decision to pursue SNF/PEG tube placement versus shift to comfort measures. She further explains that she has spoken with some of [name]'s friends who do not believe he would want prolonged feeding tube. Although hearing this, [name] is still struggling with making this decision because she does not want his friends to think she just 'gave up on him.'   [name] plans to try and video chat with friend, Earney Mallet and her uncle tomorrow afternoon.   [name] does seem to understand poor chances of [name] ever getting back home and living independently after event and 13+ days of being in the hospital.   I did explain to [name] that if she does feel like [name] needs more time, PEG tube/SNF could be considered but also that she is not 'locked in' to this decision; if [name] is not showing meaningful improvement or recovery at skilled nursing facility, she can shift to comfort and initiate hospice  services at any time.   Reviewed PT/SLP notes with her with recommendation for MBS. Explained concern that even if he safely passes swallow evaluation, he will likely still need feeding tube in order to meet nutritional needs.   Answered questions and concerns. Emotional/spiritual support provided.   Length of Stay: 13  Current Medications: Scheduled Meds:  . sodium chloride   Intravenous Once  . bacitracin   Topical BID  . chlorhexidine  15 mL Mouth Rinse BID  . feeding supplement (PRO-STAT SUGAR FREE 64)  30 mL  Per Tube Daily  . folic acid  1 mg Per Tube Daily  . Gerhardt's butt cream   Topical TID  . insulin aspart  0-15 Units Subcutaneous Q4H  . mouth rinse  15 mL Mouth Rinse q12n4p  . nicotine  14 mg Transdermal Daily  . pravastatin  20 mg Per Tube QHS  . QUEtiapine  12.5 mg Per Tube QHS  . thiamine  100 mg Per Tube Daily    Continuous Infusions: . feeding supplement (OSMOLITE 1.5 CAL) 1,000 mL (05/16/18 1312)    PRN Meds: acetaminophen **OR** acetaminophen, bisacodyl, calcium carbonate, haloperidol lactate, hydrALAZINE, insulin aspart, loperamide, loratadine, ondansetron **OR** ondansetron (ZOFRAN) IV, promethazine, sodium chloride flush, sodium phosphate  Physical Exam Vitals signs and nursing note reviewed.  Constitutional:      General: He is awake.     Appearance: He is ill-appearing.  HENT:     Head: Normocephalic and atraumatic.  Cardiovascular:     Rate and Rhythm: Normal rate.  Pulmonary:     Effort: No tachypnea, accessory muscle usage or respiratory distress.  Abdominal:     Tenderness: There is no abdominal tenderness.  Skin:    General: Skin is warm and dry.  Neurological:     Mental Status: He is alert.     Comments: Awake, alert, oriented to name. Follows commands with encouragement. Mildly restless in the bed.   Psychiatric:        Speech: Speech is delayed.        Cognition and Memory: Cognition is impaired.            Vital Signs: BP 138/90  (BP Location: Right Arm)   Pulse 92   Temp (!) 97 F (36.1 C)   Resp (!) 22   Ht 6' 0.52" (1.842 m)   Wt 84.3 kg   SpO2 98%   BMI 24.85 kg/m  SpO2: SpO2: 98 % O2 Device: O2 Device: Room Air O2 Flow Rate: O2 Flow Rate (L/min): 2 L/min  Intake/output summary:   Intake/Output Summary (Last 24 hours) at 05/17/2018 1407 Last data filed at 05/17/2018 0300 Gross per 24 hour  Intake -  Output 450 ml  Net -450 ml   LBM: Last BM Date: 05/15/18 Baseline Weight: Weight: 88.2 kg Most recent weight: Weight: 84.3 kg       Palliative Assessment/Data: PPS 30%    Flowsheet Rows     Most Recent Value  Intake Tab  Referral Department  Hospitalist  Unit at Time of Referral  Intermediate Care Unit  Palliative Care Primary Diagnosis  Neurology  Palliative Care Type  New Palliative care  Date first seen by Palliative Care  05/11/18  Clinical Assessment  Palliative Performance Scale Score  30%  Psychosocial & Spiritual Assessment  Palliative Care Outcomes  Patient/Family meeting held?  Yes  Who was at the meeting?  niece via telephone  Palliative Care Outcomes  Clarified goals of care, Provided end of life care assistance, Provided psychosocial or spiritual support, ACP counseling assistance      Patient Active Problem List   Diagnosis Date Noted  . Palliative care by specialist   . Goals of care, counseling/discussion   . Alcoholic intoxication with complication ([place])   . Thrombocytopenia ([place])   . SAH (subarachnoid hemorrhage) ([place])   . Agitation   . Respiratory distress 05/08/2018  . Alcohol abuse 05/08/2018  . Subdural hematoma ([place]) 05/04/2018  . Prostate cancer ([place]) 04/16/2017  . Malignant neoplasm of prostate ([place]) 01/07/2017  .  Hepatic cirrhosis ([place]) 08/17/2013  . Diverticulosis 07/19/2013  . DIARRHEA 08/22/2008  . COLITIS 08/21/2008  . DIVERTICULITIS, COLON 08/21/2008  . Diabetes mellitus type 2 in nonobese ([place]) 08/20/2008  . HYPERLIPIDEMIA 08/20/2008  . SMOKER  08/20/2008  . Essential hypertension 08/20/2008  . EXTERNAL HEMORRHOIDS 08/20/2008  . COLONIC POLYPS, ADENOMATOUS, HX OF 08/20/2008    Palliative Care Assessment & Plan   Patient Profile: 73 y.o. male  with past medical history of DM, HLD, HTN, liver cirrhosis, prostate cancer, smoker, ETOH use admitted on 05/04/2018 after fall with alcohol intoxication. Initially admitted to neurosurgery with right intraparenchymal hematoma, subdural hematoma and C2 fracture. TRH consulted on 5/3 due to increased agitation and respiratory distress, felt related to alcohol withdrawal. Ativan initiated. Also noted to have hypoglycemia, pulmonary edema and pleural effusions. Repeat CT head 5/4 stable. Ongoing encephalopathy and agitation. Unable to work with SLP. Cortrak to be placed 5/6. Palliative medicine consultation for goals of care.   Assessment: Fall Right intraparenchymal hematoma Right subdural hematoma and possible intraventricular hemorrhage C2 fracture Acute encephalopathy ETOH abuse Fluid overload Liver cirrhosis Hypoalbuminemia Suspected COPD  Recommendations/Plan:  DNR/DNI  Continue current plan of care including cortrak/feeding tube.   SLP recommending MBS.   F/u GOC with niece this afternoon. She is considering SNF/PEG placement versus comfort measures and hospice referral. Niece struggling with this decision and requests more time to consider options.  PMT to follow    Code Status: DNR   Code Status Orders  (From admission, onward)         Start     Ordered   05/10/18 1343  Do not attempt resuscitation (DNR)  Continuous    Question Answer Comment  In the event of cardiac or respiratory ARREST Do not call a "code blue"   In the event of cardiac or respiratory ARREST Do not perform Intubation, CPR, defibrillation or ACLS   In the event of cardiac or respiratory ARREST Use medication by any route, position, wound care, and other measures to relive pain and suffering. May  use oxygen, suction and manual treatment of airway obstruction as needed for comfort.      05/10/18 1343        Code Status History    Date Active Date Inactive Code Status Order ID Comments User Context   05/04/2018 0536 05/10/2018 1343 Full Code [id number]  Elpidio Eric ED   04/16/2017 1617 04/17/2017 1901 Full Code [id number]  McKenzieCandee Furbish, MD Inpatient       Prognosis:   Unable to determine  Discharge Planning:  To Be Determined  Care plan was discussed with Dr Karleen Hampshire, niece, patient, RN  Thank you for allowing the Palliative Medicine Team to assist in the care of this patient.   Time In: 1410 Time Out: 1455 Total Time 45 Prolonged Time Billed no      Greater than 50%  of this time was spent counseling and coordinating care related to the above assessment and plan.  Ihor Dow, DNP, FNP-C Palliative Medicine Team  Phone: [phone] Fax: [phone]  Please contact Palliative Medicine Team phone at [phone] for questions and concerns.

## 2018-05-18 LAB — GLUCOSE, CAPILLARY
Glucose-Capillary: 156 mg/dL — ABNORMAL HIGH (ref 70–99)
Glucose-Capillary: 144 mg/dL — ABNORMAL HIGH (ref 70–99)
Glucose-Capillary: 191 mg/dL — ABNORMAL HIGH (ref 70–99)
Glucose-Capillary: 179 mg/dL — ABNORMAL HIGH (ref 70–99)
Glucose-Capillary: 196 mg/dL — ABNORMAL HIGH (ref 70–99)
Glucose-Capillary: 216 mg/dL — ABNORMAL HIGH (ref 70–99)

## 2018-05-18 NOTE — Progress Notes (Signed)
Physical Therapy Treatment Patient Details Name: [name] MRN: [id number] DOB: Oct 21, 1945 Today's Date: 05/18/2018    History of Present Illness [name] is a 73 y.o. male With history of diabetes, hyperlipidemia, hypertension, prostate cancer, cirrhosis who was brought to ED after a fall. By report he was drinking with a friend when he fell forward. Patient is amnestic to events surrounding the fall. He underwent CT head/C spine which is significant for an acute right SDH and C2 fracture (managed with Aspen collar).    PT Comments    Pt received supine in soiled bed linens. Therapy focused on bed mobility rolling to right and left for pericare and changing linens. Pt requiring totalA for pericare. Pt declining out of bed mobility, requesting to be "raised higher," in the bed. Pt very restless, but following more simple commands today.     Follow Up Recommendations  SNF;Supervision/Assistance - 24 hour     Equipment Recommendations  Other (comment)(tbd)    Recommendations for Other Services       Precautions / Restrictions Precautions Precautions: Fall;Cervical Precaution Booklet Issued: No Precaution Comments: belt restraint and B posey mitts; NG tube Required Braces or Orthoses: Cervical Brace Cervical Brace: Hard collar;At all times Restrictions Weight Bearing Restrictions: No    Mobility  Bed Mobility Overal bed mobility: Needs Assistance Bed Mobility: Rolling Rolling: Max assist;+2 for safety/equipment         General bed mobility comments: pt able to initiate rolling to right and left for peri care and changing bed linens but needs max assist to maintain as pt very restless  Transfers                    Ambulation/Gait                 Stairs             Wheelchair Mobility    Modified Rankin (Stroke Patients Only)       Balance                                            Cognition  Arousal/Alertness: Awake/alert Behavior During Therapy: Flat affect;Restless Overall Cognitive Status: Impaired/Different from baseline Area of Impairment: Orientation;Attention;Following commands;Safety/judgement;Awareness;Problem solving                 Orientation Level: Disoriented to;Place;Time;Situation Current Attention Level: Focused   Following Commands: Follows one step commands with increased time Safety/Judgement: Decreased awareness of safety Awareness: Intellectual Problem Solving: Slow processing;Decreased initiation;Difficulty sequencing;Requires verbal cues;Requires tactile cues General Comments: Pt following simple commands ~75% of the time, perseverating on "please help me." Restless in bed      Exercises      General Comments        Pertinent Vitals/Pain Pain Assessment: Faces Faces Pain Scale: Hurts even more Pain Location: "all over" Pain Descriptors / Indicators: Grimacing;Discomfort Pain Intervention(s): Monitored during session    Home Living                      Prior Function            PT Goals (current goals can now be found in the care plan section) Acute Rehab PT Goals Patient Stated Goal: none stated Potential to Achieve Goals: Fair    Frequency    Min 3X/week  PT Plan Current plan remains appropriate    Co-evaluation              AM-PAC PT "6 Clicks" Mobility   Outcome Measure  Help needed turning from your back to your side while in a flat bed without using bedrails?: Total Help needed moving from lying on your back to sitting on the side of a flat bed without using bedrails?: A Lot Help needed moving to and from a bed to a chair (including a wheelchair)?: A Lot Help needed standing up from a chair using your arms (e.g., wheelchair or bedside chair)?: A Lot Help needed to walk in hospital room?: A Lot Help needed climbing 3-5 steps with a railing? : Total 6 Click Score: 10    End of Session  Equipment Utilized During Treatment: Cervical collar Activity Tolerance: Patient limited by fatigue Patient left: in bed;with call bell/phone within reach;with bed alarm set;with restraints reapplied Nurse Communication: Mobility status PT Visit Diagnosis: Unsteadiness on feet (R26.81);Other abnormalities of gait and mobility (R26.89);History of falling (Z91.81);Muscle weakness (generalized) (M62.81);Difficulty in walking, not elsewhere classified (R26.2)     Time: 3007-6226 PT Time Calculation (min) (ACUTE ONLY): 18 min  Charges:  $Therapeutic Activity: 8-22 mins                     Ellamae Sia, PT, DPT Acute Rehabilitation Services Pager [phone] Office [phone]    Willy Eddy 05/18/2018, 4:53 PM

## 2018-05-18 NOTE — Progress Notes (Signed)
Chart reviewed. Discussed with RN. Waiting on SLP MBS evaluation. Unsure if this will be done today. RN to page unit SLP. PMT to follow. Even if he can safely be started on a diet, suspect he will need ongoing nutritional support via feeding tube if niece decides to continue current plan of care and pursue SNF placement.    NO CHARGE  Ihor Dow, FNP-C Palliative Medicine Team  Phone: [phone] Fax: [phone]

## 2018-05-18 NOTE — Progress Notes (Signed)
Assisted tele visit to patient with family member-niece.Cleotis Nipper, RN

## 2018-05-18 NOTE — Progress Notes (Signed)
PROGRESS NOTE    [name]  QPY:[id number] DOB: 1945/09/05 DOA: 05/04/2018 PCP: Celene Squibb, MD    Brief Narrative:   [name] is a 73 year old male with history of metastatic prostate cancer, Liver cirrhosis, diabetes mellitus, hypertension, alcohol abuse, tobacco abuse was admitted to neurosurgery on 4/29 with right intraparenchymal hematoma, subdural hematoma and a C2 fracture. -He had been awaiting SNF placement, through his hospitalization he was noted to have increased confusion and agitation as well as respiratory distress. -TRH was consulted 5/3 due to increased agitation and respiratory distres -Also noted to have hypoglycemia, pulmonary edema and pleural effusions 5/4: TRH took over from [facility] course notable for alcohol withdrawal and prolonged delirium, continues to be encephalopathic with dysphasia   Assessment & Plan:   Right intraparenchymal hematoma, extensive right subdural hematoma and possible intraventricular hemorrhage with traumatic C2 fracture -Following a fall -INR 1.2, has chronic thrombocytopenia from liver cirrhosis -Admitted by neurosurgery, nonoperative management recommended  -Remains in c-collar  -Repeat CT head from 5/11 overall stable and improving, right increasing right extra-axial hematoma noted  Encephalopathy /acute alcohol withdrawal  and prolonged delirium -Alcohol level was markedly elevated on admission at 212 -On day 2 of hospitalization patient was noted to have increased agitation and confusion consistent with timeline for acute withdrawal -Treated for alcohol withdrawal with high doses of Ativan, however continues to remain encephalopathic, CT head is stable -At this point expect prolonged hospital delirium also playing a role -Continue thiamine, continue low-dose Seroquel nightly only -Continue tube feeds for now -Ongoing palliative care discussions  Fluid overload -Likely secondary to liver cirrhosis  with hypoalbuminemia and third spacing -Chest x-ray and CTA chest 5/3 was consistent with pulmonary edema and pleural effusions -COVID PCR is negative -Off IV fluids, given Lasix 5/3 and 5/4 -Appears euvolemic now, monitor off diuretics  Liver Cirrhosis -Suspect alcoholic liver disease given long history of alcoholism -Chronic thrombocytopenia associated with liver disease -Currently volume overloaded IV Lasix as noted above  Heavy tobacco abuse/suspected COPD -Per niece smoked 2 packs/day for 50 years, i.e. 100 pack year -Likely has underlying COPD from this -Respiratory status overall stable  DM -Holding Metformin -Moderate-scale SSI  HTN -On Lisinopril  Metastatic Prostate CA -s/p seed implants -Recent PSA reportedly normal  DVT prophylaxis: SCDs Code Status: DNR  Family Communication: No family at bedside, will update niece again tomorrow Disposition Plan:  To be determined  Consultants:   Neuro surgery. Dr Kathyrn Sheriff  Palliative care     Procedures: none.    Antimicrobials: none.    Subjective: -No issues overnight, remains somnolent, confused  Objective: Vitals:   05/18/18 0806 05/18/18 1100 05/18/18 1112 05/18/18 1205  BP: (!) 168/88  (!) 123/94 99/61  Pulse: 92 (!) 110 95 93  Resp: (!) 21 (!) 32 (!) 22 (!) 25  Temp: 99.6 F (37.6 C)   98.5 F (36.9 C)  TempSrc: Axillary   Oral  SpO2: 92% 91% 100% 100%  Weight:      Height:        Intake/Output Summary (Last 24 hours) at 05/18/2018 1408 Last data filed at 05/18/2018 0538 Gross per 24 hour  Intake 680 ml  Output 1975 ml  Net -1295 ml   Filed Weights   05/16/18 0425 05/17/18 0500 05/18/18 0500  Weight: 89.2 kg 84.3 kg 80.7 kg    Examination:  Gen: Somnolent, arousable, opens eyes tracks mumbles few words and goes back to sleep HEENT: Pupils equal and reactive Lungs:  Decreased breath sounds both bases, rest clear CVS: RRR,No Gallops,Rubs or new Murmurs Abd: soft, Non tender, non  distended, BS present Extremities: No edema Skin: no new rashes Neuro, somewhat alert, confused, moves all extremities involuntarily Psychiatry: cannot be assessed.      Data Reviewed: I have personally reviewed following labs and imaging studies  CBC: No results for input(s): WBC, NEUTROABS, HGB, HCT, MCV, PLT in the last 168 hours. Basic Metabolic Panel: Recent Labs  Lab 05/12/18 0912 05/14/18 0320 05/15/18 0245 05/16/18 0554 05/17/18 0158  NA 136 139 138 137 137  K 3.6 3.5 3.7 4.4 4.8  CL 96* 102 100 98 96*  CO2 29 28 28 28 29   GLUCOSE 113* 111* 172* 179* 174*  BUN 12 13 12 11 14   CREATININE 0.69 0.73 0.77 0.77 0.83  CALCIUM 8.6* 8.5* 8.6* 8.5* 8.8*  MG  --  1.6* 2.1 1.9 1.9  PHOS  --  2.8 3.0 3.5 3.8   GFR: Estimated Creatinine Clearance: 88.3 mL/min (by C-G formula based on SCr of 0.83 mg/dL). Liver Function Tests: No results for input(s): AST, ALT, ALKPHOS, BILITOT, PROT, ALBUMIN in the last 168 hours. No results for input(s): LIPASE, AMYLASE in the last 168 hours. No results for input(s): AMMONIA in the last 168 hours. Coagulation Profile: No results for input(s): INR, PROTIME in the last 168 hours. Cardiac Enzymes: No results for input(s): CKTOTAL, CKMB, CKMBINDEX, TROPONINI in the last 168 hours. BNP (last 3 results) No results for input(s): PROBNP in the last 8760 hours. HbA1C: No results for input(s): HGBA1C in the last 72 hours. CBG: Recent Labs  Lab 05/17/18 1933 05/17/18 2340 05/18/18 0330 05/18/18 0802 05/18/18 1202  GLUCAP 171* 148* 156* 144* 191*   Lipid Profile: No results for input(s): CHOL, HDL, LDLCALC, TRIG, CHOLHDL, LDLDIRECT in the last 72 hours. Thyroid Function Tests: No results for input(s): TSH, T4TOTAL, FREET4, T3FREE, THYROIDAB in the last 72 hours. Anemia Panel: No results for input(s): VITAMINB12, FOLATE, FERRITIN, TIBC, IRON, RETICCTPCT in the last 72 hours. Sepsis Labs: No results for input(s): PROCALCITON, LATICACIDVEN  in the last 168 hours.  No results found for this or any previous visit (from the past 240 hour(s)).       Radiology Studies: No results found.      Scheduled Meds: . sodium chloride   Intravenous Once  . bacitracin   Topical BID  . chlorhexidine  15 mL Mouth Rinse BID  . feeding supplement (PRO-STAT SUGAR FREE 64)  30 mL Per Tube Daily  . folic acid  1 mg Per Tube Daily  . Gerhardt's butt cream   Topical TID  . insulin aspart  0-15 Units Subcutaneous Q4H  . mouth rinse  15 mL Mouth Rinse q12n4p  . nicotine  14 mg Transdermal Daily  . pravastatin  20 mg Per Tube QHS  . QUEtiapine  25 mg Per Tube QHS  . thiamine  100 mg Per Tube Daily   Continuous Infusions: . feeding supplement (OSMOLITE 1.5 CAL) 60 mL/hr at 05/18/18 0538     LOS: 14 days    Time spent: 35 minutes.     Domenic Polite, MD [facility]  05/18/2018, 2:08 PM

## 2018-05-19 ENCOUNTER — Inpatient Hospital Stay (HOSPITAL_COMMUNITY): Payer: Medicare Other

## 2018-05-19 DIAGNOSIS — R1310 Dysphagia, unspecified: Secondary | ICD-10-CM

## 2018-05-19 LAB — GLUCOSE, CAPILLARY
Glucose-Capillary: 143 mg/dL — ABNORMAL HIGH (ref 70–99)
Glucose-Capillary: 202 mg/dL — ABNORMAL HIGH (ref 70–99)
Glucose-Capillary: 138 mg/dL — ABNORMAL HIGH (ref 70–99)
Glucose-Capillary: 225 mg/dL — ABNORMAL HIGH (ref 70–99)
Glucose-Capillary: 250 mg/dL — ABNORMAL HIGH (ref 70–99)

## 2018-05-19 LAB — BASIC METABOLIC PANEL
Anion gap: 10 (ref 5–15)
BUN: 28 mg/dL — ABNORMAL HIGH (ref 8–23)
CO2: 25 mmol/L (ref 22–32)
Calcium: 8.8 mg/dL — ABNORMAL LOW (ref 8.9–10.3)
Chloride: 100 mmol/L (ref 98–111)
Creatinine, Ser: 1.28 mg/dL — ABNORMAL HIGH (ref 0.61–1.24)
GFR calc Af Amer: >60 mL/min (ref >60)
GFR calc non Af Amer: 55 mL/min — ABNORMAL LOW (ref >60)
Glucose, Bld: 200 mg/dL — ABNORMAL HIGH (ref 70–99)
Potassium: 4.7 mmol/L (ref 3.5–5.1)
Sodium: 135 mmol/L (ref 135–145)

## 2018-05-19 LAB — CBC
HCT: 38 % — ABNORMAL LOW (ref 39.0–52.0)
Hemoglobin: 13.5 g/dL (ref 13.0–17.0)
MCH: 32.7 pg (ref 26.0–34.0)
MCHC: 35.5 g/dL (ref 30.0–36.0)
MCV: 92 fL (ref 80.0–100.0)
Platelets: 106 10*3/uL — ABNORMAL LOW (ref 150–400)
RBC: 4.13 MIL/uL — ABNORMAL LOW (ref 4.22–5.81)
RDW: 13.4 % (ref 11.5–15.5)
WBC: 6.2 10*3/uL (ref 4.0–10.5)
nRBC: 0 % (ref 0.0–0.2)

## 2018-05-19 MED ORDER — FREE WATER
300.0000 mL | Freq: Three times a day (TID) | Status: DC
  Administered 2018-05-19 – 2018-05-23 (×14): 300 mL

## 2018-05-19 MED ORDER — RESOURCE THICKENUP CLEAR PO POWD
ORAL | Status: DC | PRN
  Filled 2018-05-19: qty 125

## 2018-05-19 NOTE — Progress Notes (Signed)
PROGRESS NOTE    [name]  [id number]:[id number] DOB: 01/19/1945 DOA: 05/04/2018 PCP: Celene Squibb, MD    Brief Narrative:   Mr. Hyndman is a 73 year old male with history of metastatic prostate cancer, Liver cirrhosis, diabetes mellitus, hypertension, alcohol abuse, tobacco abuse was admitted to neurosurgery on 4/29 with right intraparenchymal hematoma, subdural hematoma and a C2 fracture. -He had been awaiting SNF placement, through his hospitalization he was noted to have increased confusion and agitation as well as respiratory distress. -TRH was consulted 5/3 due to increased agitation and respiratory distres -Also noted to have hypoglycemia, pulmonary edema and pleural effusions 5/4: TRH took over from [facility] course notable for alcohol withdrawal and prolonged delirium, continues to be encephalopathic with dysphagia   Assessment & Plan:   Right intraparenchymal hematoma, extensive right subdural hematoma and  intraventricular hemorrhage with traumatic C2 fracture -Following a fall -INR 1.2, has chronic thrombocytopenia from liver cirrhosis -Admitted by neurosurgery, nonoperative management recommended  -Remains in c-collar  -Repeat CT head from 5/11 overall stable and improving, slight increase in right extra-axial hematoma noted  Encephalopathy /acute alcohol withdrawal  and prolonged delirium -Alcohol level was markedly elevated on admission at 212 -On day 2 of hospitalization patient was noted to have increased agitation and confusion consistent with timeline for acute withdrawal -Treated for alcohol withdrawal with high doses of Ativan, however continued to remain encephalopathic, CT head is stable -Subsequently noted to have prolonged ICU delirium -Maintained on thiamine, he was started on low-dose Seroquel several days ago -Slight improvement in mentation noted today -Plan for MBS to reassess swallowing -Continue tube feeds for now, continue ongoing  palliative care discussions  Fluid overload -Likely secondary to liver cirrhosis with hypoalbuminemia and third spacing -Chest x-ray and CTA chest 5/3 was consistent with pulmonary edema and pleural effusions -COVID PCR is negative -Off IV fluids, given Lasix 5/3 and 5/4 -Appears euvolemic now, monitor off diuretics  Liver Cirrhosis -Suspect alcoholic liver disease given long history of alcoholism -Chronic thrombocytopenia associated with liver disease -Currently volume overloaded IV Lasix as noted above  Heavy tobacco abuse/suspected COPD -Per niece smoked 2 packs/day for 50 years, i.e. 100 pack year -Likely has underlying COPD from this -Respiratory status overall stable  DM -Holding Metformin -Moderate-scale SSI  HTN -On Lisinopril  Metastatic Prostate CA -s/p seed implants -Recent PSA reportedly normal  DVT prophylaxis: SCDs Code Status: DNR  Family Communication: No family at bedside will update niece following MBS Disposition Plan:  To be determined  Consultants:   Neuro surgery. Dr Kathyrn Sheriff  Palliative care     Procedures: none.    Antimicrobials: none.    Subjective: -More awake and alert today, yesterday required IV Haldol in the afternoon for agitation  Objective: Vitals:   05/19/18 0335 05/19/18 0400 05/19/18 0740 05/19/18 1130  BP:  130/72 126/72 113/80  Pulse:  (!) 52 92 97  Resp:  (!) 22 (!) 25 20  Temp: 98.4 F (36.9 C)  97.9 F (36.6 C) 98.9 F (37.2 C)  TempSrc: Oral  Oral Oral  SpO2:  96% 98% 100%  Weight:      Height:        Intake/Output Summary (Last 24 hours) at 05/19/2018 1320 Last data filed at 05/19/2018 1132 Gross per 24 hour  Intake 720 ml  Output 400 ml  Net 320 ml   Filed Weights   05/16/18 0425 05/17/18 0500 05/18/18 0500  Weight: 89.2 kg 84.3 kg 80.7 kg  Examination: Gen: Awake, more alert, oriented to self only, more lucid today able to answer some questions appropriately HEENT: PERRLA, Neck  supple, no JVD Lungs: Decreased breath sounds at both bases CVS: RRR,No Gallops,Rubs or new Murmurs Abd: soft, Non tender, non distended, BS present Extremities: No edema Skin: no new rashes Neuro, more alert, less confused, moves all extremities involuntarily Psychiatry: cannot be assessed.      Data Reviewed: I have personally reviewed following labs and imaging studies  CBC: Recent Labs  Lab 05/19/18 0511  WBC 6.2  HGB 13.5  HCT 38.0*  MCV 92.0  PLT 081*   Basic Metabolic Panel: Recent Labs  Lab 05/14/18 0320 05/15/18 0245 05/16/18 0554 05/17/18 0158 05/19/18 0511  NA 139 138 137 137 135  K 3.5 3.7 4.4 4.8 4.7  CL 102 100 98 96* 100  CO2 28 28 28 29 25   GLUCOSE 111* 172* 179* 174* 200*  BUN 13 12 11 14  28*  CREATININE 0.73 0.77 0.77 0.83 1.28*  CALCIUM 8.5* 8.6* 8.5* 8.8* 8.8*  MG 1.6* 2.1 1.9 1.9  --   PHOS 2.8 3.0 3.5 3.8  --    GFR: Estimated Creatinine Clearance: 57.3 mL/min (A) (by C-G formula based on SCr of 1.28 mg/dL (H)). Liver Function Tests: No results for input(s): AST, ALT, ALKPHOS, BILITOT, PROT, ALBUMIN in the last 168 hours. No results for input(s): LIPASE, AMYLASE in the last 168 hours. No results for input(s): AMMONIA in the last 168 hours. Coagulation Profile: No results for input(s): INR, PROTIME in the last 168 hours. Cardiac Enzymes: No results for input(s): CKTOTAL, CKMB, CKMBINDEX, TROPONINI in the last 168 hours. BNP (last 3 results) No results for input(s): PROBNP in the last 8760 hours. HbA1C: No results for input(s): HGBA1C in the last 72 hours. CBG: Recent Labs  Lab 05/18/18 1951 05/18/18 2319 05/19/18 0334 05/19/18 0745 05/19/18 1130  GLUCAP 196* 216* 143* 202* 138*   Lipid Profile: No results for input(s): CHOL, HDL, LDLCALC, TRIG, CHOLHDL, LDLDIRECT in the last 72 hours. Thyroid Function Tests: No results for input(s): TSH, T4TOTAL, FREET4, T3FREE, THYROIDAB in the last 72 hours. Anemia Panel: No results for  input(s): VITAMINB12, FOLATE, FERRITIN, TIBC, IRON, RETICCTPCT in the last 72 hours. Sepsis Labs: No results for input(s): PROCALCITON, LATICACIDVEN in the last 168 hours.  No results found for this or any previous visit (from the past 240 hour(s)).       Radiology Studies: No results found.      Scheduled Meds: . sodium chloride   Intravenous Once  . bacitracin   Topical BID  . chlorhexidine  15 mL Mouth Rinse BID  . feeding supplement (PRO-STAT SUGAR FREE 64)  30 mL Per Tube Daily  . folic acid  1 mg Per Tube Daily  . free water  300 mL Per Tube Q8H  . Gerhardt's butt cream   Topical TID  . insulin aspart  0-15 Units Subcutaneous Q4H  . mouth rinse  15 mL Mouth Rinse q12n4p  . nicotine  14 mg Transdermal Daily  . pravastatin  20 mg Per Tube QHS  . QUEtiapine  25 mg Per Tube QHS  . thiamine  100 mg Per Tube Daily   Continuous Infusions: . feeding supplement (OSMOLITE 1.5 CAL) 1,000 mL (05/19/18 1248)     LOS: 15 days    Time spent: 35 minutes.     Domenic Polite, MD [facility]  05/19/2018, 1:20 PM

## 2018-05-19 NOTE — Progress Notes (Signed)
Modified Barium Swallow Progress Note  Patient Details  Name: [name] MRN: [id number] Date of Birth: 10-Apr-1945  Today's Date: 05/19/2018  Modified Barium Swallow completed.  Full report located under Chart Review in the Imaging Section.  Brief recommendations include the following:  Clinical Impression  Pt was alert and exhibited mild oropharyngeal dysphagia with adequate airway protection during MBS. Decreased oral control and manipulation led to lingual residue, delayed propulsion and holding. Sensation and swallow initiation was timely with one instance of nectar barium reaching pyriform sinuses before swallow triggered. Question mildly decreased epiglottic deflection in combination of presence of Cortrak due to maximum vallecular residue with puree and mild-mod pyriform sinus. Initially honey thick yielded mod pharyngeal residue fading to mild-mod at end of study. Pt's cognition and alertness have waxed and waned significantly therefore pt must consume po's only when fully alert and full supervision with Dys 1, honey thick, crush meds and check for oral pocketing.    Swallow Evaluation Recommendations       SLP Diet Recommendations: Dysphagia 1 (Puree) solids;Honey thick liquids   Liquid Administration via: Cup;No straw   Medication Administration: Crushed with puree   Supervision: Full assist for feeding;Full supervision/cueing for compensatory strategies   Compensations: Slow rate;Small sips/bites;Lingual sweep for clearance of pocketing;Multiple dry swallows after each bite/sip   Postural Changes: Seated upright at 90 degrees   Oral Care Recommendations: Oral care BID   Other Recommendations: Order thickener from pharmacy    [name] 05/19/2018,3:12 PM   Orbie Pyo Colvin Caroli.Ed Risk analyst [phone] Office [phone]

## 2018-05-19 NOTE — TOC Progression Note (Signed)
Transition of Care [facility]) - Progression Note    Patient Details  Name: [name] MRN: [id number] Date of Birth: [birth year]  Transition of Care [facility] CM/SW [place], [place] Phone Number: 05/19/2018, 1:04 PM  Clinical Narrative:    Continuing to follow. Aware pt currently still in mitts and posey, waxing and waning mental status, MBS scheduled for today.    Expected Discharge Plan: Skilled Nursing Facility Barriers to Discharge: Other (comment)(Patient unable to provide consent due to cognitive status and no current established HCPOA/legal guardian.)  Expected Discharge Plan and Services Expected Discharge Plan: [place] arrangements for the past 2 months: Apartment     Social Determinants of Health (SDOH) Interventions    Readmission Risk Interventions No flowsheet data found.

## 2018-05-19 NOTE — Progress Notes (Signed)
  Speech Language Pathology    Patient Details Name: [name] MRN: [id number] DOB: [birth year] Today's Date: 05/19/2018 Time:  -      MBS scheduled today at 2:00                [name] 05/19/2018, 11:20 AM   Orbie Pyo Colvin Caroli.Ed Risk analyst [phone] Office [phone]

## 2018-05-19 NOTE — Progress Notes (Signed)
Nutrition Follow-up   RD working remotely.  DOCUMENTATION CODES:   Not applicable  INTERVENTION:  Continue Osmolite 1.5 formula via post pyloric Cortrak NGT at goal rate of 60 ml/hr.   Continue 30 ml Prostat once daily.    Continue free water flushes of 300 ml every 8 hours. (MD to adjust as appropriate)  Tube feeding regimen provides 2260 kcal (100% of needs), 105 grams of protein, and 1994 ml of H2O.  NUTRITION DIAGNOSIS:   Inadequate oral intake related to inability to eat as evidenced by NPO status; ongoing  GOAL:   Patient will meet greater than or equal to 90% of their needs; met with TF  MONITOR:   TF tolerance, Labs, Skin, Weight trends, I & O's  REASON FOR ASSESSMENT:   NPO/Clear Liquid Diet, Low Braden    ASSESSMENT:   73 year old male with history of metastatic prostate cancer, Liver cirrhosis, diabetes mellitus, hypertension, alcohol abuse, tobacco abuse was admitted to neurosurgery on 4/29 with right intraparenchymal hematoma, subdural hematoma and a C2 fracture. Cortrak NGT has been placed 5/8. Tip of tube placed post pyloric.   Plans for MBS today. Per Palliative MD, if diet able to be advanced, suspect pt will need ongoing enteral nutrition support via tube. Pt with ongoing encephalopathy per MD. Goals of care ongoing with family. Pt has been tolerating his tube feeds well. Will continue to monitor for tolerance. Labs and medications reviewed.   Diet Order:   Diet Order            Diet NPO time specified  Diet effective now              EDUCATION NEEDS:   Not appropriate for education at this time  Skin:  Skin Assessment: Reviewed RN Assessment  Last BM:  5/10  Height:   Ht Readings from Last 1 Encounters:  05/09/18 6' 0.52" (1.842 m)    Weight:   Wt Readings from Last 1 Encounters:  05/18/18 80.7 kg    Ideal Body Weight:  82.3 kg  BMI:  Body mass index is 23.78 kg/m.  Estimated Nutritional Needs:   Kcal:   2150-2350  Protein:  105-120 grams  Fluid:  >/= 2 L/day    Corrin Parker, MS, RD, LDN Pager # [phone] After hours/ weekend pager # [phone]

## 2018-05-19 NOTE — Progress Notes (Signed)
Daily Progress Note   Patient Name: GEAN LAROSE       Date: 05/19/2018 DOB: June 17, 1945  Age: 73 y.o. MRN#: [id number] Attending Physician: Domenic Polite, MD Primary Care Physician: Celene Squibb, MD Admit Date: 05/04/2018  Reason for Consultation/Follow-up: Establishing goals of care  Subjective: [name] will wake to voice but he is drowsy this afternoon. Recently completed MBS eval. No distress or discomfort.  GOC:  F/u with niece, [name] via telephone. Discussed MBS evaluation and recommendations. Discussed plan of care including diagnoses/interventions. [name] has struggled with decision for PEG/SNF placement versus hospice and after further discussion she is leaning towards comfort measures with discharge to hospice facility. Discussed comfort feeds in this situation. Discussed goal of comfort, quality of life and dignity at EOL. Also symptom management. [name] again speaks of [name] being a dignified individual prior to this event and worsening depression. She feels he would not find this quality of life acceptable.   [name] is leaning towards comfort/hospice but would like to discuss with attending prior to making final decision.   Answered questions and concerns. [name] has PMT contact information.    **Shortly after, PMT provider discussed with Dr. Broadus John who is considering recommendation for SNF with dysphagia diet. Hold on PEG tube placement and see how he does with dysphagia diet. She will call niece, [name] this evening for further [place] discussion.   Length of Stay: 15  Current Medications: Scheduled Meds:  . sodium chloride   Intravenous Once  . bacitracin   Topical BID  . chlorhexidine  15 mL Mouth Rinse BID  . feeding supplement (PRO-STAT SUGAR FREE 64)  30 mL Per Tube Daily  .  folic acid  1 mg Per Tube Daily  . free water  300 mL Per Tube Q8H  . Gerhardt's butt cream   Topical TID  . insulin aspart  0-15 Units Subcutaneous Q4H  . mouth rinse  15 mL Mouth Rinse q12n4p  . nicotine  14 mg Transdermal Daily  . pravastatin  20 mg Per Tube QHS  . QUEtiapine  25 mg Per Tube QHS  . thiamine  100 mg Per Tube Daily    Continuous Infusions: . feeding supplement (OSMOLITE 1.5 CAL) 1,000 mL (05/19/18 1248)    PRN Meds: acetaminophen **OR** acetaminophen, bisacodyl, calcium carbonate, haloperidol lactate, hydrALAZINE, insulin  aspart, loperamide, ondansetron **OR** ondansetron (ZOFRAN) IV, Resource ThickenUp Clear, sodium chloride flush, sodium phosphate  Physical Exam Vitals signs and nursing note reviewed.  Constitutional:      General: He is awake.     Appearance: He is ill-appearing.  HENT:     Head: Normocephalic and atraumatic.  Cardiovascular:     Rate and Rhythm: Normal rate.  Pulmonary:     Effort: No tachypnea, accessory muscle usage or respiratory distress.  Abdominal:     Tenderness: There is no abdominal tenderness.  Skin:    General: Skin is warm and dry.  Neurological:     Mental Status: He is alert.     Comments: Awake, alert, oriented to name. Follows commands with encouragement. Mildly restless in the bed.   Psychiatric:        Speech: Speech is delayed.        Cognition and Memory: Cognition is impaired.            Vital Signs: BP 116/70 (BP Location: Right Arm)   Pulse 98   Temp 98.2 F (36.8 C) (Oral)   Resp (!) 21   Ht 6' 0.52" (1.842 m)   Wt 80.7 kg   SpO2 99%   BMI 23.78 kg/m  SpO2: SpO2: 99 % O2 Device: O2 Device: Room Air O2 Flow Rate: O2 Flow Rate (L/min): 2 L/min  Intake/output summary:   Intake/Output Summary (Last 24 hours) at 05/19/2018 1623 Last data filed at 05/19/2018 1132 Gross per 24 hour  Intake 720 ml  Output 400 ml  Net 320 ml   LBM: Last BM Date: 05/19/18 Baseline Weight: Weight: 88.2 kg Most recent  weight: Weight: 80.7 kg       Palliative Assessment/Data: PPS 30%    Flowsheet Rows     Most Recent Value  Intake Tab  Referral Department  Hospitalist  Unit at Time of Referral  Intermediate Care Unit  Palliative Care Primary Diagnosis  Neurology  Date Notified  05/11/18  Palliative Care Type  New Palliative care  Reason for referral  Non-pain Symptom, Clarify Goals of Care  Date of Admission  05/04/18  Date first seen by Palliative Care  05/11/18  # of days Palliative referral response time  0 Day(s)  # of days IP prior to Palliative referral  7  Clinical Assessment  Palliative Performance Scale Score  30%  Psychosocial & Spiritual Assessment  Palliative Care Outcomes  Patient/Family meeting held?  Yes  Who was at the meeting?  niece via telephone  Palliative Care Outcomes  Clarified goals of care, Provided end of life care assistance, Provided psychosocial or spiritual support, ACP counseling assistance      Patient Active Problem List   Diagnosis Date Noted  . Palliative care by specialist   . Goals of care, counseling/discussion   . Alcoholic intoxication with complication ([place])   . Thrombocytopenia ([place])   . SAH (subarachnoid hemorrhage) ([place])   . Agitation   . Respiratory distress 05/08/2018  . Alcohol abuse 05/08/2018  . Subdural hematoma ([place]) 05/04/2018  . Prostate cancer ([place]) 04/16/2017  . Malignant neoplasm of prostate ([place]) 01/07/2017  . Hepatic cirrhosis ([place]) 08/17/2013  . Diverticulosis 07/19/2013  . DIARRHEA 08/22/2008  . COLITIS 08/21/2008  . DIVERTICULITIS, COLON 08/21/2008  . Diabetes mellitus type 2 in nonobese ([place]) 08/20/2008  . HYPERLIPIDEMIA 08/20/2008  . SMOKER 08/20/2008  . Essential hypertension 08/20/2008  . EXTERNAL HEMORRHOIDS 08/20/2008  . COLONIC POLYPS, ADENOMATOUS, HX OF 08/20/2008  Palliative Care Assessment & Plan   Patient Profile: 73 y.o. male  with past medical history of DM, HLD, HTN, liver cirrhosis, prostate  cancer, smoker, ETOH use admitted on 05/04/2018 after fall with alcohol intoxication. Initially admitted to neurosurgery with right intraparenchymal hematoma, subdural hematoma and C2 fracture. TRH consulted on 5/3 due to increased agitation and respiratory distress, felt related to alcohol withdrawal. Ativan initiated. Also noted to have hypoglycemia, pulmonary edema and pleural effusions. Repeat CT head 5/4 stable. Ongoing encephalopathy and agitation. Unable to work with SLP. Cortrak to be placed 5/6. Palliative medicine consultation for goals of care.   Assessment: Fall Right intraparenchymal hematoma Right subdural hematoma and possible intraventricular hemorrhage C2 fracture Acute encephalopathy ETOH abuse Fluid overload Liver cirrhosis Hypoalbuminemia Suspected COPD  Recommendations/Plan:  DNR/DNI  MBS completed today. Dysphagia 1, thick liquid diet initiated.   Niece leaning against PEG tube placement and focus on comfort/hospice services. Dr. Broadus John will further discuss plan of care with niece this evening. May consider SNF to attempt rehab and see how he does with dysphagia diet.    Code Status: DNR   Code Status Orders  (From admission, onward)         Start     Ordered   05/10/18 1343  Do not attempt resuscitation (DNR)  Continuous    Question Answer Comment  In the event of cardiac or respiratory ARREST Do not call a "code blue"   In the event of cardiac or respiratory ARREST Do not perform Intubation, CPR, defibrillation or ACLS   In the event of cardiac or respiratory ARREST Use medication by any route, position, wound care, and other measures to relive pain and suffering. May use oxygen, suction and manual treatment of airway obstruction as needed for comfort.      05/10/18 1343        Code Status History    Date Active Date Inactive Code Status Order ID Comments User Context   05/04/2018 0536 05/10/2018 1343 Full Code [id number]  [facility]    04/16/2017 1617 04/17/2017 1901 Full Code [id number]  McKenzieCandee Furbish, MD Inpatient       Prognosis:   Unable to determine  Discharge Planning:  To Be Determined  Care plan was discussed with niece, Dr Broadus John  Thank you for allowing the Palliative Medicine Team to assist in the care of this patient.   Time In: 1545- Time Out: 1620 Total Time 35 Prolonged Time Billed no      Greater than 50%  of this time was spent counseling and coordinating care related to the above assessment and plan.  Ihor Dow, DNP, FNP-C Palliative Medicine Team  Phone: [phone] Fax: [phone]  Please contact Palliative Medicine Team phone at [phone] for questions and concerns.

## 2018-05-19 NOTE — Care Management Important Message (Signed)
Important Message  Patient Details  Name: [name] MRN: [id number] Date of Birth: February 10, 1945   Medicare Important Message Given:  Yes    Orbie Pyo 05/19/2018, 2:45 PM

## 2018-05-20 LAB — BASIC METABOLIC PANEL
Anion gap: 9 (ref 5–15)
BUN: 31 mg/dL — ABNORMAL HIGH (ref 8–23)
CO2: 25 mmol/L (ref 22–32)
Calcium: 8.5 mg/dL — ABNORMAL LOW (ref 8.9–10.3)
Chloride: 96 mmol/L — ABNORMAL LOW (ref 98–111)
Creatinine, Ser: 1.13 mg/dL (ref 0.61–1.24)
GFR calc Af Amer: >60 mL/min (ref >60)
GFR calc non Af Amer: >60 mL/min (ref >60)
Glucose, Bld: 198 mg/dL — ABNORMAL HIGH (ref 70–99)
Potassium: 4.6 mmol/L (ref 3.5–5.1)
Sodium: 130 mmol/L — ABNORMAL LOW (ref 135–145)

## 2018-05-20 LAB — CBC
HCT: 36.1 % — ABNORMAL LOW (ref 39.0–52.0)
Hemoglobin: 12.7 g/dL — ABNORMAL LOW (ref 13.0–17.0)
MCH: 32.5 pg (ref 26.0–34.0)
MCHC: 35.2 g/dL (ref 30.0–36.0)
MCV: 92.3 fL (ref 80.0–100.0)
Platelets: 113 10*3/uL — ABNORMAL LOW (ref 150–400)
RBC: 3.91 MIL/uL — ABNORMAL LOW (ref 4.22–5.81)
RDW: 13.2 % (ref 11.5–15.5)
WBC: 5.4 10*3/uL (ref 4.0–10.5)
nRBC: 0 % (ref 0.0–0.2)

## 2018-05-20 LAB — GLUCOSE, CAPILLARY
Glucose-Capillary: 188 mg/dL — ABNORMAL HIGH (ref 70–99)
Glucose-Capillary: 193 mg/dL — ABNORMAL HIGH (ref 70–99)
Glucose-Capillary: 194 mg/dL — ABNORMAL HIGH (ref 70–99)
Glucose-Capillary: 219 mg/dL — ABNORMAL HIGH (ref 70–99)
Glucose-Capillary: 238 mg/dL — ABNORMAL HIGH (ref 70–99)
Glucose-Capillary: 242 mg/dL — ABNORMAL HIGH (ref 70–99)
Glucose-Capillary: 267 mg/dL — ABNORMAL HIGH (ref 70–99)

## 2018-05-20 LAB — AMMONIA: Ammonia: 40 umol/L — ABNORMAL HIGH (ref 9–35)

## 2018-05-20 NOTE — Progress Notes (Addendum)
Physical Therapy Treatment Patient Details Name: [name] MRN: [id number] DOB: [birth year] Today's Date: 05/20/2018    History of Present Illness [name] is a 73 y.o. male With history of diabetes, hyperlipidemia, hypertension, prostate cancer, cirrhosis who was brought to ED after a fall. By report he was drinking with a friend when he fell forward. Patient is amnestic to events surrounding the fall. He underwent CT head/C spine which is significant for an acute right SDH and C2 fracture (managed with Aspen collar).    PT Comments    Pt more lethargic today with minimal verbal interactions, frequently attempting to return to supine once sitting edge of bed. Requiring two person maximal assistance for transfers to standing x 6 and able to side step up to edge of bed. Goals updated in regards to pt current status.    Follow Up Recommendations  SNF;Supervision/Assistance - 24 hour     Equipment Recommendations  Other (comment)(defer)    Recommendations for Other Services       Precautions / Restrictions Precautions Precautions: Fall;Cervical Precaution Booklet Issued: No Precaution Comments: bil. mittens, waist restraint, Cortrak  Required Braces or Orthoses: Cervical Brace Cervical Brace: Hard collar;At all times Restrictions Weight Bearing Restrictions: No    Mobility  Bed Mobility Overal bed mobility: Needs Assistance Bed Mobility: Supine to Sit;Sit to Supine     Supine to sit: Total assist;+2 for physical assistance;+2 for safety/equipment Sit to supine: Max assist;+2 for physical assistance;+2 for safety/equipment   General bed mobility comments: Pt will assist very minimally with moving toward EOB.  He frequently attempts to return to supine, and was able to assist with lowering his trunk to the bed and lifting his LEs onto the bed   Transfers Overall transfer level: Needs assistance Equipment used: 2 person hand held assist Transfers: Sit  to/from Stand Sit to Stand: Max assist;+2 physical assistance;+2 safety/equipment         General transfer comment: Pt requried max encouragement and cues.  He stood x 6.  He was able to assist minimally with lifting buttocks from bed and extending hips.  He sidestepped up Side of bed with max A to assist with lateral weight shift and assist to move Lt and Rt LE   Ambulation/Gait                 Stairs             Wheelchair Mobility    Modified Rankin (Stroke Patients Only) Modified Rankin (Stroke Patients Only) Pre-Morbid Rankin Score: No symptoms Modified Rankin: Severe disability     Balance Overall balance assessment: Needs assistance Sitting-balance support: Single extremity supported;Feet supported Sitting balance-Leahy Scale: Poor Sitting balance - Comments: Pt requires close min guard assist and unilateral UE support - able to sit >6 mins with min guard assist    Standing balance support: Bilateral upper extremity supported Standing balance-Leahy Scale: Poor Standing balance comment: Pt with posterior lean.  Keeps knees bent, and frequently attempts to sit.  He stood x 5 to step/scoot up toward the [place]: Lethargic;Awake/alert Behavior During Therapy: Flat affect Overall Cognitive Status: Impaired/Different from baseline Area of Impairment: Orientation;Attention;Following commands;Memory;Safety/judgement;Awareness;Problem solving                 Orientation Level: Disoriented to;Place;Time;Situation Current Attention Level: Focused  Following Commands: Follows one step commands inconsistently;Follows one step commands with increased time Safety/Judgement: Decreased awareness of safety;Decreased awareness of deficits   Problem Solving: Slow processing;Decreased initiation;Difficulty sequencing;Requires verbal cues;Requires tactile cues General Comments: Pt lethargic today with  minimal interactions.  He frequently tries to return to supine.  He followed one step commands intermittently cues.  He will not engage in ADL tasks today      Exercises      General Comments General comments (skin integrity, edema, etc.): VSS      Pertinent Vitals/Pain Pain Assessment: Faces Faces Pain Scale: Hurts a little bit Pain Location: generalized  Pain Descriptors / Indicators: Grimacing Pain Intervention(s): Monitored during session    Home Living                      Prior Function            PT Goals (current goals can now be found in the care plan section) Acute Rehab PT Goals Patient Stated Goal: Pt unable to state  PT Goal Formulation: With patient Time For Goal Achievement: 06/03/18 Potential to Achieve Goals: Fair    Frequency    Min 2X/week      PT Plan Frequency needs to be updated    Co-evaluation PT/OT/SLP Co-Evaluation/Treatment: Yes Reason for Co-Treatment: Complexity of the patient's impairments (multi-system involvement);Necessary to address cognition/behavior during functional activity;For patient/therapist safety;To address functional/ADL transfers   OT goals addressed during session: ADL's and self-care;Strengthening/ROM      AM-PAC PT "6 Clicks" Mobility   Outcome Measure  Help needed turning from your back to your side while in a flat bed without using bedrails?: A Lot Help needed moving from lying on your back to sitting on the side of a flat bed without using bedrails?: Total Help needed moving to and from a bed to a chair (including a wheelchair)?: Total Help needed standing up from a chair using your arms (e.g., wheelchair or bedside chair)?: Total Help needed to walk in hospital room?: Total Help needed climbing 3-5 steps with a railing? : Total 6 Click Score: 7    End of Session Equipment Utilized During Treatment: Cervical collar Activity Tolerance: Patient limited by fatigue Patient left: in bed;with call  bell/phone within reach;with bed alarm set;with restraints reapplied Nurse Communication: Mobility status PT Visit Diagnosis: Unsteadiness on feet (R26.81);Other abnormalities of gait and mobility (R26.89);History of falling (Z91.81);Muscle weakness (generalized) (M62.81);Difficulty in walking, not elsewhere classified (R26.2)     Time: 4034-7425 PT Time Calculation (min) (ACUTE ONLY): 28 min  Charges:  $Therapeutic Activity: 8-22 mins                     Ellamae Sia, PT, DPT Acute Rehabilitation Services Pager [phone] Office [phone]    Willy Eddy 05/20/2018, 3:40 PM

## 2018-05-20 NOTE — Progress Notes (Signed)
PROGRESS NOTE    [name]  YPP:[id number] DOB: [birth year] DOA: 05/04/2018 PCP: Celene Squibb, MD   Brief Narrative:  [name] is a 73 year old male with history of metastatic prostate cancer, Liver cirrhosis, diabetes mellitus, hypertension, alcohol abuse, tobacco abuse was admitted to neurosurgery on 4/29 with right intraparenchymal hematoma, subdural hematoma and a C2 fracture. -He had been awaiting SNF placement, through his hospitalization he was noted to have increased confusion and agitation as well as respiratory distress. -TRH was consulted 5/3 due to increased agitation and respiratory distres -Also noted to have hypoglycemia, pulmonary edema and pleural effusions 5/4: [facility] took over from [facility] course notable for alcohol withdrawal and prolonged delirium, continues to be encephalopathic with dysphagia -Core track placed, started on tube feeds -Palliative consulted as well  Assessment & Plan:   Right intraparenchymal hematoma, extensive right subdural hematoma and  intraventricular hemorrhage with traumatic C2 fracture -Following a fall -INR 1.2, has chronic thrombocytopenia from liver cirrhosis -Admitted by neurosurgery, nonoperative management recommended  -Remains in c-collar  -Repeat CT head from 5/11 overall stable and improving, slight increase in right extra-axial hematoma noted  Encephalopathy /acute alcohol withdrawal  and prolonged delirium -Alcohol level was markedly elevated on admission at 212 -On day 2 of hospitalization patient was noted to have increased agitation and confusion consistent with timeline for acute withdrawal -Treated for alcohol withdrawal with high doses of Ativan, however continued to remain encephalopathic, CT head is stable -Subsequently noted to have prolonged ICU delirium -Maintained on thiamine, he was started on low-dose Seroquel last week however this may be contributing to a small extent to his lethargy will  discontinue Seroquel today  -Given slight improvement in mentation yesterday he underwent MBS and was started on a dysphagia 1 diet with honey thick liquids  -However remains somewhat somnolent -Continue tube feeds for now, continue ongoing palliative care discussions -Attempt pured diet today if he is awake enough to sit up and converse -Check ammonia level again  Fluid overload -Likely secondary to liver cirrhosis with hypoalbuminemia and third spacing -Chest x-ray and CTA chest 5/3 was consistent with pulmonary edema and pleural effusions -COVID PCR is negative -Off IV fluids, given Lasix 5/3 and 5/4 -Appears euvolemic now, monitor off diuretics  Liver Cirrhosis -Suspect alcoholic liver disease given long history of alcoholism -Chronic thrombocytopenia associated with liver disease -Given IV Lasix earlier this admission, now euvolemic, monitor  Heavy tobacco abuse/suspected COPD -Per niece smoked 2 packs/day for 50 years, i.e. 100 pack year -Likely has underlying COPD from this -Respiratory status overall stable  DM -Holding Metformin -Moderate-scale SSI  HTN -On Lisinopril  Metastatic Prostate CA -s/p seed implants -Recent PSA reportedly normal  DVT prophylaxis: SCDs Code Status: DNR  Family Communication: No family at bedside, called and updated niece [name] 5/14 Disposition Plan:  [facility] with palliative care follow-up if he improves  Consultants:   Neuro surgery. Dr Kathyrn Sheriff  Palliative care     Procedures: none.    Antimicrobials: none.    Subjective: -Slightly more somnolent today, got Seroquel after midnight yesterday Objective: Vitals:   05/20/18 0010 05/20/18 0301 05/20/18 0747 05/20/18 1150  BP: (!) 151/88 138/69 134/67 (!) 141/78  Pulse: 88 96 91 95  Resp: 20 18 (!) 27 20  Temp: 98.8 F (37.1 C) 98 F (36.7 C) 98.4 F (36.9 C) 98.4 F (36.9 C)  TempSrc: Axillary Axillary Oral Oral  SpO2: 97% 98% 97% 97%  Weight:  Height:        Intake/Output Summary (Last 24 hours) at 05/20/2018 1312 Last data filed at 05/20/2018 1153 Gross per 24 hour  Intake --  Output 600 ml  Net -600 ml   Filed Weights   05/16/18 0425 05/17/18 0500 05/18/18 0500  Weight: 89.2 kg 84.3 kg 80.7 kg    Examination: Gen: Somnolent, Arousable, oriented to self only HEENT: PERRLA, Neck supple, no JVD Lungs: Decreased breath sounds at both bases CVS: RRR,No Gallops,Rubs or new Murmurs Abd: soft, Non tender, non distended, BS present Extremities: No edema Skin: no new rashes Neuro, more alert, less confused, moves all extremities involuntarily Psychiatry: cannot be assessed.      Data Reviewed: I have personally reviewed following labs and imaging studies  CBC: Recent Labs  Lab 05/19/18 0511 05/20/18 0341  WBC 6.2 5.4  HGB 13.5 12.7*  HCT 38.0* 36.1*  MCV 92.0 92.3  PLT 106* 616*   Basic Metabolic Panel: Recent Labs  Lab 05/14/18 0320 05/15/18 0245 05/16/18 0554 05/17/18 0158 05/19/18 0511 05/20/18 0341  NA 139 138 137 137 135 130*  K 3.5 3.7 4.4 4.8 4.7 4.6  CL 102 100 98 96* 100 96*  CO2 28 28 28 29 25 25   GLUCOSE 111* 172* 179* 174* 200* 198*  BUN 13 12 11 14  28* 31*  CREATININE 0.73 0.77 0.77 0.83 1.28* 1.13  CALCIUM 8.5* 8.6* 8.5* 8.8* 8.8* 8.5*  MG 1.6* 2.1 1.9 1.9  --   --   PHOS 2.8 3.0 3.5 3.8  --   --    GFR: Estimated Creatinine Clearance: 64.9 mL/min (by C-G formula based on SCr of 1.13 mg/dL). Liver Function Tests: No results for input(s): AST, ALT, ALKPHOS, BILITOT, PROT, ALBUMIN in the last 168 hours. No results for input(s): LIPASE, AMYLASE in the last 168 hours. No results for input(s): AMMONIA in the last 168 hours. Coagulation Profile: No results for input(s): INR, PROTIME in the last 168 hours. Cardiac Enzymes: No results for input(s): CKTOTAL, CKMB, CKMBINDEX, TROPONINI in the last 168 hours. BNP (last 3 results) No results for input(s): PROBNP in the last 8760  hours. HbA1C: No results for input(s): HGBA1C in the last 72 hours. CBG: Recent Labs  Lab 05/19/18 1955 05/20/18 0007 05/20/18 0310 05/20/18 0744 05/20/18 1149  GLUCAP 250* 193* 188* 238* 219*   Lipid Profile: No results for input(s): CHOL, HDL, LDLCALC, TRIG, CHOLHDL, LDLDIRECT in the last 72 hours. Thyroid Function Tests: No results for input(s): TSH, T4TOTAL, FREET4, T3FREE, THYROIDAB in the last 72 hours. Anemia Panel: No results for input(s): VITAMINB12, FOLATE, FERRITIN, TIBC, IRON, RETICCTPCT in the last 72 hours. Sepsis Labs: No results for input(s): PROCALCITON, LATICACIDVEN in the last 168 hours.  No results found for this or any previous visit (from the past 240 hour(s)).       Radiology Studies: Dg Swallowing Func-speech Pathology  Result Date: 05/19/2018 Objective Swallowing Evaluation: Type of Study: MBS-Modified Barium Swallow Study  Patient Details Name: [name] [name] Date of Birth: Jun 22, 1945 Today's Date: 05/19/2018 Time: SLP Start Time (ACUTE ONLY): 1331 -SLP Stop Time (ACUTE ONLY): 1343 SLP Time Calculation (min) (ACUTE ONLY): 12 min Past Medical History: Past Medical History: Diagnosis Date  Anxiety   Benign localized prostatic hyperplasia with lower urinary tract symptoms (LUTS)   Depression   Diverticulosis of colon   GERD (gastroesophageal reflux disease)   History of colitis   History of colonic diverticulitis   03-10-2006 s/p bowel resection  History of deep vein thrombosis (DVT) of lower extremity 06/2006  bilateral femoral/   04-08-2017 per pt had several dvt's in 2008  Hypertension   Hypertriglyceridemia   NAFLD (nonalcoholic fatty liver disease)   OA (osteoarthritis)   knees  Peripheral neuropathy   Prostate cancer [facility]) urologist-- dr Alyson Ingles  oncologist- dr Tammi Klippel  dx 12-02-2016-- Stage T1c, Gleason 3+4,  PSA 8.2, 37.8cc-- scheduled for radioactive seed implants 04-16-2017  S/P insertion of IVC (inferior vena caval)  filter 07/02/2006  due to bilateral fermoral dvt  Seasonal allergies   Thrombocytopenia ([place])   Type 2 diabetes mellitus ([place])   followed by pcp  Weak urinary stream   Wears glasses  Past Surgical History: Past Surgical History: Procedure Laterality Date  CATARACT EXTRACTION W/ INTRAOCULAR LENS  IMPLANT, BILATERAL  2010  CHOLECYSTECTOMY OPEN  1973  and APPENDECTOMY  COLONOSCOPY N/A 06/01/2012  Procedure: COLONOSCOPY;  Surgeon: Rogene Houston, MD;  Location: [facility];  Service: Endoscopy;  Laterality: N/A;  930-moved to 1025 Ann to notify pt  IVC FILTER INSERTION  07/02/2006  PROSTATE BIOPSY  12-02-2016   dr Alyson Ingles office  RADIOACTIVE SEED IMPLANT N/A 04/16/2017  Procedure: RADIOACTIVE SEED IMPLANT/BRACHYTHERAPY IMPLANT;  Surgeon: Cleon Gustin, MD;  Location: [facility];  Service: Urology;  Laterality: N/A;  SIGMOID RESECTION W/ STAPLED SIDE TO SIDE ANASTOMOSIS/ DRAINAGE ABSCESS  03-10-2006  dr Romona Curls [facility]  SPACE OAR INSTILLATION N/A 04/16/2017  Procedure: SPACE OAR INSTILLATION;  Surgeon: Cleon Gustin, MD;  Location: [facility];  Service: Urology;  Laterality: N/A; HPI: [name] is a 73 y.o. male with history of diabetes, GERD, hyperlipidemia, hypertension, prostate cancer, cirrhosis who was brought to ED after a fall. CT head/C spine which is significant for an acute right SDH and C2 fracture. Repeat CT 4/30 revealed worsening findings: Some increased blooming of the right temporal intraparenchymal hematoma, measuring 5 x 3.3 x 2.2 cm today compared with 4.4 x 2.7 x 1.9 cm on the immediate previous film. Slight increase in size of the left frontoparietal vertex contusions, increased by a mm or 2, measuring 7 and 10 mm presently. CXR No acute cardiopulmonary disease. MBS 05/06/18 with recommendation of puree and nectar. Susequently pt aspirated, made NPO. Pt appropriate for repeat MBS today.  Subjective: pt in chair, had been fully awake with  pt/ot at edge of bed 15 minutes prior Assessment / Plan / Recommendation CHL IP CLINICAL IMPRESSIONS 05/19/2018 Clinical Impression Pt was alert and exhibited mild oropharyngeal dysphagia with adequate airway protection during MBS. Decreased oral control and manipulation led to lingual residue, delayed propulsion and holding. Sensation and swallow initiation was timely with one instance of nectar barium reaching pyriform sinuses before swallow triggered. Question mildly decreased epiglottic deflection in combination of presence of Cortrak due to maximum vallecular residue with puree and mild-mod pyriform sinus. Initially honey thick yielded mod pharyngeal residue fading to mild-mod at end of study. Pt's cognition and alertness have waxed and waned significantly therefore pt must consume po's only when fully alert and full supervision with Dys 1, honey thick, crush meds and check for oral pocketing.  SLP Visit Diagnosis Dysphagia, oropharyngeal phase (R13.12) Attention and concentration deficit following -- Frontal lobe and executive function deficit following -- Impact on safety and function Mild aspiration risk;Moderate aspiration risk   CHL IP TREATMENT RECOMMENDATION 05/19/2018 Treatment Recommendations Therapy as outlined in treatment plan below   Prognosis 05/19/2018 Prognosis for Safe Diet Advancement (No Data) Barriers to  Reach Goals -- Barriers/Prognosis Comment -- CHL IP DIET RECOMMENDATION 05/19/2018 SLP Diet Recommendations Dysphagia 1 (Puree) solids;Honey thick liquids Liquid Administration via Cup;No straw Medication Administration Crushed with puree Compensations Slow rate;Small sips/bites;Lingual sweep for clearance of pocketing;Multiple dry swallows after each bite/sip Postural Changes Seated upright at 90 degrees   CHL IP OTHER RECOMMENDATIONS 05/19/2018 Recommended Consults -- Oral Care Recommendations Oral care BID Other Recommendations Order thickener from pharmacy   CHL IP FOLLOW UP RECOMMENDATIONS  05/19/2018 Follow up Recommendations Skilled Nursing facility   [facility] IP FREQUENCY AND DURATION 05/19/2018 Speech Therapy Frequency (ACUTE ONLY) min 2x/week Treatment Duration 2 weeks      CHL IP ORAL PHASE 05/19/2018 Oral Phase Impaired Oral - Pudding Teaspoon -- Oral - Pudding Cup -- Oral - Honey Teaspoon -- Oral - Honey Cup Holding of bolus;Delayed oral transit Oral - Nectar Teaspoon NT Oral - Nectar Cup Delayed oral transit;Holding of bolus;Lingual/palatal residue Oral - Nectar Straw NT Oral - Thin Teaspoon -- Oral - Thin Cup NT Oral - Thin Straw NT Oral - Puree Delayed oral transit;Holding of bolus Oral - Mech Soft NT Oral - Regular -- Oral - Multi-Consistency -- Oral - Pill -- Oral Phase - Comment --  CHL IP PHARYNGEAL PHASE 05/19/2018 Pharyngeal Phase Impaired Pharyngeal- Pudding Teaspoon -- Pharyngeal -- Pharyngeal- Pudding Cup -- Pharyngeal -- Pharyngeal- Honey Teaspoon -- Pharyngeal -- Pharyngeal- Honey Cup Pharyngeal residue - valleculae;Pharyngeal residue - pyriform;Reduced epiglottic inversion Pharyngeal -- Pharyngeal- Nectar Teaspoon NT Pharyngeal -- Pharyngeal- Nectar Cup Pharyngeal residue - valleculae;Pharyngeal residue - pyriform;Delayed swallow initiation-pyriform sinuses Pharyngeal -- Pharyngeal- Nectar Straw NT Pharyngeal -- Pharyngeal- Thin Teaspoon NT Pharyngeal -- Pharyngeal- Thin Cup NT Pharyngeal -- Pharyngeal- Thin Straw -- Pharyngeal -- Pharyngeal- Puree Pharyngeal residue - valleculae;Pharyngeal residue - pyriform;Reduced epiglottic inversion Pharyngeal -- Pharyngeal- Mechanical Soft NT Pharyngeal -- Pharyngeal- Regular -- Pharyngeal -- Pharyngeal- Multi-consistency -- Pharyngeal -- Pharyngeal- Pill -- Pharyngeal -- Pharyngeal Comment --  CHL IP CERVICAL ESOPHAGEAL PHASE 05/19/2018 Cervical Esophageal Phase WFL Pudding Teaspoon -- Pudding Cup -- Honey Teaspoon -- Honey Cup -- Nectar Teaspoon -- Nectar Cup -- Nectar Straw -- Thin Teaspoon -- Thin Cup -- Thin Straw -- Puree -- Mechanical Soft --  Regular -- Multi-consistency -- Pill -- Cervical Esophageal Comment -- [name] 05/19/2018, 3:12 PM  [name] M.Ed Risk analyst [phone] Office [phone]                  Scheduled Meds:  sodium chloride   Intravenous Once   bacitracin   Topical BID   chlorhexidine  15 mL Mouth Rinse BID   feeding supplement (PRO-STAT SUGAR FREE 64)  30 mL Per Tube Daily   folic acid  1 mg Per Tube Daily   free water  300 mL Per Tube Q8H   Gerhardt's butt cream   Topical TID   insulin aspart  0-15 Units Subcutaneous Q4H   mouth rinse  15 mL Mouth Rinse q12n4p   nicotine  14 mg Transdermal Daily   pravastatin  20 mg Per Tube QHS   thiamine  100 mg Per Tube Daily   Continuous Infusions:  feeding supplement (OSMOLITE 1.5 CAL) 1,000 mL (05/20/18 0615)     LOS: 16 days    Time spent: 25 minutes.     Domenic Polite, MD [facility]  05/20/2018, 1:12 PM

## 2018-05-20 NOTE — Progress Notes (Signed)
Occupational Therapy Treatment Patient Details Name: [name] MRN: [id number] DOB: May 03, 1945 Today's Date: 05/20/2018    History of present illness [name] is a 73 y.o. male With history of diabetes, hyperlipidemia, hypertension, prostate cancer, cirrhosis who was brought to ED after a fall. By report he was drinking with a friend when he fell forward. Patient is amnestic to events surrounding the fall. He underwent CT head/C spine which is significant for an acute right SDH and C2 fracture (managed with Aspen collar).   OT comments  Pt seen in conjunction with PT.  He was lethargic today.  He required max A for bed mobility, min guard assist for EOB sitting, and max A +2 to stand and side step up EOB.  He only followed commands intermittently, and would not engage in ADL tasks today.  He repeatedly attempted to return to supine.  Goals downgraded.    Follow Up Recommendations  SNF;Supervision/Assistance - 24 hour    Equipment Recommendations  None recommended by OT    Recommendations for Other Services      Precautions / Restrictions Precautions Precautions: Fall;Cervical Precaution Booklet Issued: No Precaution Comments: bil. mittens. Cortrak  Required Braces or Orthoses: Cervical Brace Cervical Brace: Hard collar;At all times       Mobility Bed Mobility Overal bed mobility: Needs Assistance Bed Mobility: Supine to Sit;Sit to Supine     Supine to sit: Total assist;+2 for physical assistance;+2 for safety/equipment Sit to supine: Max assist;+2 for physical assistance;+2 for safety/equipment   General bed mobility comments: Pt will assist very minimally with moving toward EOB.  He frequently attempts to return to supine, and was able to assist with lowering his trunk to the bed and lifting his LEs onto the bed   Transfers Overall transfer level: Needs assistance Equipment used: 2 person hand held assist Transfers: Sit to/from Stand Sit to Stand: Max  assist;+2 physical assistance;+2 safety/equipment         General transfer comment: Pt requried max encouragement and cues.  He stood x 6.  He was able to assist minimally with lifting buttocks from bed and extending hips.  He sidestepped up Side of bed with max A to assist with lateral weight shift and assist to move Lt and Rt LE     Balance Overall balance assessment: Needs assistance Sitting-balance support: Single extremity supported;Feet supported Sitting balance-Leahy Scale: Poor Sitting balance - Comments: Pt requires close min guard assist and unilateral UE support - able to sit >6 mins with min guard assist    Standing balance support: Bilateral upper extremity supported Standing balance-Leahy Scale: Poor Standing balance comment: Pt with posterior lean.  Keeps knees bent, and frequently attempts to sit.  He stood x 5 to step/scoot up toward the [facility]                            ADL either performed or assessed with clinical judgement   ADL Overall ADL's : Needs assistance/impaired Eating/Feeding: Maximal assistance;Bed level;Sitting   Grooming: Wash/dry hands;Wash/dry face;Oral care;Brushing hair;Total assistance;Sitting Grooming Details (indicate cue type and reason): attempted to engage him in combing hair.  Requires total hand over hand assist, with pt making no attempt to take over                  Toilet Transfer: Maximal assistance;+2 for physical assistance;Stand-pivot;BSC   Toileting- Clothing Manipulation and Hygiene: Total assistance  Functional mobility during ADLs: Moderate assistance;+2 for physical assistance;+2 for safety/equipment       Vision       Perception     Praxis      Cognition Arousal/Alertness: Lethargic;Awake/alert Behavior During Therapy: Flat affect Overall Cognitive Status: Impaired/Different from baseline Area of Impairment: Orientation;Attention;Following commands;Memory;Safety/judgement;Awareness;Problem  solving                 Orientation Level: Disoriented to;Place;Time;Situation Current Attention Level: Focused   Following Commands: Follows one step commands inconsistently;Follows one step commands with increased time Safety/Judgement: Decreased awareness of safety;Decreased awareness of deficits   Problem Solving: Slow processing;Decreased initiation;Difficulty sequencing;Requires verbal cues;Requires tactile cues General Comments: Pt lethargic today with minimal interactions.  He frequently tries to return to supine.  He followed commands intermittently  with  cues.  He will not engage in ADL tasks today        Exercises     Shoulder Instructions       General Comments VSS    Pertinent Vitals/ Pain       Pain Assessment: Faces Faces Pain Scale: Hurts a little bit Pain Location: generalized  Pain Descriptors / Indicators: Grimacing Pain Intervention(s): Monitored during session;Repositioned  Home Living                                          Prior Functioning/Environment              Frequency  Min 2X/week        Progress Toward Goals  OT Goals(current goals can now be found in the care plan section)  Progress towards OT goals: Not progressing toward goals - comment(lethargy.  Goals updated )  Acute Rehab OT Goals Patient Stated Goal: Pt unable to state  OT Goal Formulation: Patient unable to participate in goal setting Time For Goal Achievement: 06/03/18 Potential to Achieve Goals: Fair ADL Goals Pt Will Perform Grooming: with min assist;sitting Pt Will Perform Upper Body Bathing: sitting;with mod assist Pt Will Perform Lower Body Bathing: sit to/from stand;with max assist Pt Will Perform Lower Body Dressing: sit to/from stand;with max assist Pt Will Transfer to Toilet: grab bars;with mod assist;stand pivot transfer;bedside commode Pt Will Perform Toileting - Clothing Manipulation and hygiene: sit to/from stand;with max  assist Additional ADL Goal #1: Pt will consistently follow 1 step commands  Plan Discharge plan remains appropriate    Co-evaluation    PT/OT/SLP Co-Evaluation/Treatment: Yes Reason for Co-Treatment: Complexity of the patient's impairments (multi-system involvement);Necessary to address cognition/behavior during functional activity;For patient/therapist safety;To address functional/ADL transfers   OT goals addressed during session: ADL's and self-care;Strengthening/ROM      AM-PAC OT "6 Clicks" Daily Activity     Outcome Measure   Help from another person eating meals?: Total Help from another person taking care of personal grooming?: Total Help from another person toileting, which includes using toliet, bedpan, or urinal?: Total Help from another person bathing (including washing, rinsing, drying)?: Total Help from another person to put on and taking off regular upper body clothing?: Total Help from another person to put on and taking off regular lower body clothing?: Total 6 Click Score: 6    End of Session Equipment Utilized During Treatment: Gait belt  OT Visit Diagnosis: Unsteadiness on feet (R26.81);Other abnormalities of gait and mobility (R26.89);Muscle weakness (generalized) (M62.81);History of falling (Z91.81);Other symptoms and signs involving cognitive function  Activity Tolerance Patient limited by lethargy   Patient Left in bed;with call bell/phone within reach;with bed alarm set   Nurse Communication Mobility status        Time: 0301-4996 OT Time Calculation (min): 24 min  Charges: OT General Charges $OT Visit: 1 Visit OT Treatments $Neuromuscular Re-education: 8-22 mins  Lucille Passy, OTR/L [place] Pager [phone] Office [phone]    Lucille Passy M 05/20/2018, 1:26 PM

## 2018-05-20 NOTE — Consult Note (Signed)
   [facility] Inpatient Consult   05/20/2018  [name] 07-14-1945 [id number]    Patientwas screenedforpotentialneedsof[facility] Care Management services under his [place] with long length of stay (16 days), mediumrisk score,21% for unplanned readmissions and hospitalizations. Noted [facility] care management attempt to outreach patient in the past but unable to engage.  Per chart review and MD narrative on 05/20/18 states as: [name] is a 73 year old male with history of metastatic prostate cancer, Liver cirrhosis, diabetes mellitus, hypertension, alcohol abuse, tobacco abuse-- was admitted to neurosurgery on 4/29 with right intraparenchymal hematoma, subdural hematoma and a C2 fracture- following  fall -He had been awaiting SNF placement, through his hospitalization, he was noted to have increased confusion and agitation as well as respiratory distress,hypoglycemia, pulmonary edema and pleural effusions, notable for alcohol withdrawal and prolonged delirium, continues to be encephalopathic with dysphagia-- Palliative consulted as well.  Patient currently has been strongly recommended by PT/ OT, MD to discharge to skilled nursing facility (SNF), although, per Palliative consult note, disposition is to be determined since patient's niece [name]- only next of kin who was involved/engaged in his life) is leaning towards comfort care/ hospice, however, she is also waiting to see how patient does over the weekend before making final decisions (definitely does not want him to have PEG placement).  Primary care provider isDr. Allyn Kenner with Delphina Cahill, MD office.  Will followalong with progress and disposition,if there are changes with patient'spost hospitalneeds, please place a [facility] Care Management consultfor community follow-up as appropriate.  Of note, [facility] Care Management services does not replace or interfere with any services that are arranged bytransition of  carecase management or social work.   For questions and referral, please contact:  Nyima Vanacker A. Gaige Sebo, BSN, RN-BC [facility] Liaison Cell: [phone]

## 2018-05-20 NOTE — Progress Notes (Signed)
Daily Progress Note   Patient Name: [name]       Date: 05/20/2018 DOB: May 16, 1945  Age: 73 y.o. MRN#: [id number] Attending Physician: Domenic Polite, MD Primary Care Physician: Celene Squibb, MD Admit Date: 05/04/2018  Reason for Consultation/Follow-up: Establishing goals of care  Subjective: Drowsy this AM - minimally interactive, refused breakfast, more alert this afternoon  GOC:  Spoke with Dr. Broadus John to discuss plan of care. Plan to continue to watch patient through the weekend before making decisions beginning of next week. Dr. Broadus John was able to have conversation with patient's niece yesterday evening.  Follow up with niece, [name], who is next of kin. [name] is aware of patient's fluctuating status - alert at times and then minimally interactive at other times. [name] is struggling with making decisions for [name]. Tells me she feels as if she is "playing God".   We discussed [name] swallow eval today with some slow progression. He remains on pureed diet with honey thick liquids. We discussed difficulties of maintaining nutrition and hydration on this diet.   [name] tells me she has spoken to [name] friends about the situation and they all have discussed that prior to admission [name] had "given up" and had poor quality of life - not taking care of himself well. They all tell [name] that [name] would never want to live with a feeding tube, dependent on others, or "just existing". [name] that [name] has shared the same sentiments with her in the past.  She fears that [name] will never recover to a point where he is able to live independently and this is would be unacceptable to him.   Because of the above conversations,  [name] still tells me she is leaning towards comfort care/hospice. However, she also understands the reasoning of waiting to see how he does over the weekend/if he is able to progress with diet any. She tells me that without marked improvement in status, she does not think she wants him to go to rehab. She definitely does not want him to have a permanent feeding tube.   She understands someone from out team will follow up early next week to continue [place] discussions.   Length of Stay: 16  Current Medications: Scheduled Meds:  . sodium chloride  Intravenous Once  . bacitracin   Topical BID  . chlorhexidine  15 mL Mouth Rinse BID  . feeding supplement (PRO-STAT SUGAR FREE 64)  30 mL Per Tube Daily  . folic acid  1 mg Per Tube Daily  . free water  300 mL Per Tube Q8H  . Gerhardt's butt cream   Topical TID  . insulin aspart  0-15 Units Subcutaneous Q4H  . mouth rinse  15 mL Mouth Rinse q12n4p  . nicotine  14 mg Transdermal Daily  . pravastatin  20 mg Per Tube QHS  . thiamine  100 mg Per Tube Daily    Continuous Infusions: . feeding supplement (OSMOLITE 1.5 CAL) 1,000 mL (05/20/18 0615)    PRN Meds: acetaminophen **OR** acetaminophen, bisacodyl, calcium carbonate, hydrALAZINE, insulin aspart, loperamide, ondansetron **OR** ondansetron (ZOFRAN) IV, Resource ThickenUp Clear, sodium chloride flush, sodium phosphate           Vital Signs: BP (!) 141/78 (BP Location: Right Arm)   Pulse 95   Temp 98.4 F (36.9 C) (Oral)   Resp 20   Ht 6' 0.52" (1.842 m)   Wt 80.7 kg   SpO2 97%   BMI 23.78 kg/m  SpO2: SpO2: 97 % O2 Device: O2 Device: Room Air O2 Flow Rate: O2 Flow Rate (L/min): 2 L/min  Intake/output summary:   Intake/Output Summary (Last 24 hours) at 05/20/2018 1440 Last data filed at 05/20/2018 1153 Gross per 24 hour  Intake -  Output 600 ml  Net -600 ml   LBM: Last BM Date: 05/19/18 Baseline Weight: Weight: 88.2 kg Most recent weight: Weight: 80.7 kg       Palliative  Assessment/Data: PPS 30%    Flowsheet Rows     Most Recent Value  Intake Tab  Referral Department  Hospitalist  Unit at Time of Referral  Intermediate Care Unit  Palliative Care Primary Diagnosis  Neurology  Date Notified  05/11/18  Palliative Care Type  New Palliative care  Reason for referral  Non-pain Symptom, Clarify Goals of Care  Date of Admission  05/04/18  Date first seen by Palliative Care  05/11/18  # of days Palliative referral response time  0 Day(s)  # of days IP prior to Palliative referral  7  Clinical Assessment  Palliative Performance Scale Score  30%  Psychosocial & Spiritual Assessment  Palliative Care Outcomes  Patient/Family meeting held?  Yes  Who was at the meeting?  niece via telephone  Palliative Care Outcomes  Clarified goals of care, Provided end of life care assistance, Provided psychosocial or spiritual support, ACP counseling assistance      Patient Active Problem List   Diagnosis Date Noted  . Dysphagia   . Palliative care by specialist   . Goals of care, counseling/discussion   . Alcoholic intoxication with complication ([place])   . Thrombocytopenia ([place])   . SAH (subarachnoid hemorrhage) ([place])   . Agitation   . Respiratory distress 05/08/2018  . Alcohol abuse 05/08/2018  . Subdural hematoma ([place]) 05/04/2018  . Prostate cancer ([place]) 04/16/2017  . Malignant neoplasm of prostate ([place]) 01/07/2017  . Hepatic cirrhosis ([place]) 08/17/2013  . Diverticulosis 07/19/2013  . DIARRHEA 08/22/2008  . COLITIS 08/21/2008  . DIVERTICULITIS, COLON 08/21/2008  . Diabetes mellitus type 2 in nonobese ([place]) 08/20/2008  . HYPERLIPIDEMIA 08/20/2008  . SMOKER 08/20/2008  . Essential hypertension 08/20/2008  . EXTERNAL HEMORRHOIDS 08/20/2008  . COLONIC POLYPS, ADENOMATOUS, HX OF 08/20/2008    Palliative Care Assessment & Plan   Patient  Profile: 73 y.o. male  with past medical history of DM, HLD, HTN, liver cirrhosis, prostate cancer, smoker, ETOH use admitted  on 05/04/2018 after fall with alcohol intoxication. Initially admitted to neurosurgery with right intraparenchymal hematoma, subdural hematoma and C2 fracture. TRH consulted on 5/3 due to increased agitation and respiratory distress, felt related to alcohol withdrawal. Ativan initiated. Also noted to have hypoglycemia, pulmonary edema and pleural effusions. Repeat CT head 5/4 stable. Ongoing encephalopathy and agitation. Unable to work with SLP. Cortrak to be placed 5/6. Palliative medicine consultation for goals of care.   Assessment: Fall Right intraparenchymal hematoma Right subdural hematoma and possible intraventricular hemorrhage C2 fracture Acute encephalopathy ETOH abuse Fluid overload Liver cirrhosis Hypoalbuminemia Suspected COPD  Recommendations/Plan:  DNR/DNI  Niece does not want PEG placement  Leaning towards comfort care/hospcie but okay with waiting to see how he does over weekend before making final decisions - she has great concerns about quality of life [name] would be willing to accept  Code Status: DNR   Code Status Orders  (From admission, onward)         Start     Ordered   05/10/18 1343  Do not attempt resuscitation (DNR)  Continuous    Question Answer Comment  In the event of cardiac or respiratory ARREST Do not call a "code blue"   In the event of cardiac or respiratory ARREST Do not perform Intubation, CPR, defibrillation or ACLS   In the event of cardiac or respiratory ARREST Use medication by any route, position, wound care, and other measures to relive pain and suffering. May use oxygen, suction and manual treatment of airway obstruction as needed for comfort.      05/10/18 1343        Code Status History    Date Active Date Inactive Code Status Order ID Comments User Context   05/04/2018 0536 05/10/2018 1343 Full Code [id number]  Elpidio Eric ED   04/16/2017 1617 04/17/2017 1901 Full Code [id number]  McKenzieCandee Furbish, MD  Inpatient       Prognosis:   Unable to determine  Discharge Planning:  To Be Determined  Care plan was discussed with niece, Dr Broadus John  Thank you for allowing the Palliative Medicine Team to assist in the care of this patient.   Total Time 35 minutes Prolonged Time Billed no   The above conversation was completed via telephone due to the visitor restrictions during the COVID-19 pandemic. Thorough chart review and discussion with necessary members of the care team was completed as part of assessment. All issues were discussed and addressed but no physical exam was performed.     Greater than 50%  of this time was spent counseling and coordinating care related to the above assessment and plan.  Ihor Dow, DNP, FNP-C Palliative Medicine Team  Phone: [phone] Fax: [phone]  Please contact Palliative Medicine Team phone at [phone] for questions and concerns.

## 2018-05-20 NOTE — Progress Notes (Signed)
  Speech Language Pathology Treatment: Dysphagia  Patient Details Name: [name] MRN: [id number] DOB: August 24, 1945 Today's Date: 05/20/2018 Time: 1062-6948 SLP Time Calculation (min) (ACUTE ONLY): 16 min  Assessment / Plan / Recommendation Clinical Impression  Fortunately Mr. Venning continues to remain alert, follows commands and able to participate with cues for redirection. He continues to be quite confused and disoriented. Able to hold cup with assist for honey thick water (pt request) and mild holding bolus prior to initiating transit. One delayed cough noted approximately 4 min after last sip. RN reported pt did not want to eat breakfast. He is slowly progressing but will continue to need much assist with meals. Continue puree, only when appropriately alert, honey thick liquids (able to assess downgrade to nectar at bedside, without MBS- but not thin).   HPI HPI: [name] is a 73 y.o. male with history of diabetes, GERD, hyperlipidemia, hypertension, prostate cancer, cirrhosis who was brought to ED after a fall. CT head/C spine which is significant for an acute right SDH and C2 fracture. Repeat CT 4/30 revealed worsening findings: Some increased blooming of the right temporal intraparenchymal hematoma, measuring 5 x 3.3 x 2.2 cm today compared with 4.4 x 2.7 x 1.9 cm on the immediate previous film. Slight increase in size of the left frontoparietal vertex contusions, increased by a mm or 2, measuring 7 and 10 mm presently. CXR No acute cardiopulmonary disease. MBS 05/06/18 with recommendation of puree and nectar. Susequently pt aspirated, made NPO. Pt appropriate for repeat MBS today.      SLP Plan  Continue with current plan of care       Recommendations  Diet recommendations: Dysphagia 1 (puree);Honey-thick liquid Liquids provided via: Cup Medication Administration: Crushed with puree Supervision: Full supervision/cueing for compensatory strategies;Staff to assist  with self feeding Compensations: Slow rate;Small sips/bites;Lingual sweep for clearance of pocketing;Multiple dry swallows after each bite/sip Postural Changes and/or Swallow Maneuvers: Seated upright 90 degrees                Oral Care Recommendations: Oral care BID Follow up Recommendations: Skilled Nursing facility SLP Visit Diagnosis: Dysphagia, oropharyngeal phase (R13.12) Plan: Continue with current plan of care       GO                [name] 05/20/2018, 1:57 PM  Orbie Pyo Colvin Caroli.Ed Risk analyst [phone] Office [phone]

## 2018-05-20 NOTE — Progress Notes (Signed)
Nutrition Follow-up   RD working remotely.  DOCUMENTATION CODES:   Not applicable  INTERVENTION:  Dysphagia 1 diet with honey thick liquids when fully alert.   Provide Magic cup TID with meals, each supplement provides 290 kcal and 9 grams of protein.  Continue Osmolite 1.5 formula via post pyloric Cortrak NGTat goal rate of 71m/hr.   Continue 323mProstat once daily.   Continue free water flushes of 300 ml every 8 hours. (MD to adjust as appropriate)  Tube feeding regimen provides2260kcal (100% of needs),105grams of protein, and 199452mf H2O.  NUTRITION DIAGNOSIS:   Inadequate oral intake related to inability to eat as evidenced by NPO status; ongoing  GOAL:   Patient will meet greater than or equal to 90% of their needs; met with TF  MONITOR:   PO intake, Supplement acceptance, Diet advancement, Weight trends, Labs, TF tolerance, Skin, I & O's  REASON FOR ASSESSMENT:   NPO/Clear Liquid Diet, Low Braden    ASSESSMENT:   72 18ar old male with history of metastatic prostate cancer, Liver cirrhosis, diabetes mellitus, hypertension, alcohol abuse, tobacco abuse was admitted to neurosurgery on 4/29 with right intraparenchymal hematoma, subdural hematoma and a C2 fracture. Cortrak NGT has been placed 5/8. Tip of tube placed post pyloric.   Diet advanced to a dysphagia 1 diet with honey thick liquids. Per SLP MBS evaluation, pt with mild oropharyngeal dysphagia. Pt cognition and alertness continues to wax and wan. SLP recommends pt to consume po only when fully alert. RD to continue with current tube feeding regimen to ensure adequate nutrition is met. Goals of care discussion ongoing with family. Family leaning towards comfort measures and against PEG. Per MD, will monitor po intake and tolerance with dysphagia diet. Labs and medications reviewed.   Diet Order:   Diet Order            DIET - DYS 1 Room service appropriate? No; Fluid consistency: Honey Thick   Diet effective now              EDUCATION NEEDS:   Not appropriate for education at this time  Skin:  Skin Assessment: Reviewed RN Assessment  Last BM:  5/14  Height:   Ht Readings from Last 1 Encounters:  05/09/18 6' 0.52" (1.842 m)    Weight:   Wt Readings from Last 1 Encounters:  05/18/18 80.7 kg    Ideal Body Weight:  82.3 kg  BMI:  Body mass index is 23.78 kg/m.  Estimated Nutritional Needs:   Kcal:  2150-2350  Protein:  105-120 grams  Fluid:  >/= 2 L/day   SteCorrin ParkerS, RD, LDN Pager # [phone] hours/ weekend pager # [phone]

## 2018-05-20 NOTE — Progress Notes (Signed)
Inpatient Diabetes Program Recommendations  AACE/ADA: New Consensus Statement on Inpatient Glycemic Control (2015)  Target Ranges:  Prepandial:   less than 140 mg/dL      Peak postprandial:   less than 180 mg/dL (1-2 hours)      Critically ill patients:  140 - 180 mg/dL   Lab Results  Component Value Date   VGKKDP 947 (H) 05/20/2018    Review of Glycemic Control   Inpatient Diabetes Program Recommendations:    Patient currently has 2 Novolog correction scales ordered.   Please d/c Novolog 0-9 units correction and if in the plan of care consider Novolog Tube Feed Coverage 3 units Q4 hours.  Noted Palliative Care following.  Thanks,  Tama Headings RN, MSN, BC-ADM Inpatient Diabetes Coordinator Team Pager [phone] (8a-5p)

## 2018-05-21 DIAGNOSIS — Z7189 Other specified counseling: Secondary | ICD-10-CM

## 2018-05-21 LAB — CBC
HCT: 34.9 % — ABNORMAL LOW (ref 39.0–52.0)
Hemoglobin: 12.2 g/dL — ABNORMAL LOW (ref 13.0–17.0)
MCH: 32.2 pg (ref 26.0–34.0)
MCHC: 35 g/dL (ref 30.0–36.0)
MCV: 92.1 fL (ref 80.0–100.0)
Platelets: 111 10*3/uL — ABNORMAL LOW (ref 150–400)
RBC: 3.79 MIL/uL — ABNORMAL LOW (ref 4.22–5.81)
RDW: 13.1 % (ref 11.5–15.5)
WBC: 5.1 10*3/uL (ref 4.0–10.5)
nRBC: 0 % (ref 0.0–0.2)

## 2018-05-21 LAB — GLUCOSE, CAPILLARY
Glucose-Capillary: 172 mg/dL — ABNORMAL HIGH (ref 70–99)
Glucose-Capillary: 208 mg/dL — ABNORMAL HIGH (ref 70–99)
Glucose-Capillary: 182 mg/dL — ABNORMAL HIGH (ref 70–99)
Glucose-Capillary: 194 mg/dL — ABNORMAL HIGH (ref 70–99)
Glucose-Capillary: 188 mg/dL — ABNORMAL HIGH (ref 70–99)
Glucose-Capillary: 220 mg/dL — ABNORMAL HIGH (ref 70–99)

## 2018-05-21 LAB — BASIC METABOLIC PANEL
Anion gap: 11 (ref 5–15)
BUN: 29 mg/dL — ABNORMAL HIGH (ref 8–23)
CO2: 26 mmol/L (ref 22–32)
Calcium: 8.8 mg/dL — ABNORMAL LOW (ref 8.9–10.3)
Chloride: 96 mmol/L — ABNORMAL LOW (ref 98–111)
Creatinine, Ser: 0.92 mg/dL (ref 0.61–1.24)
GFR calc Af Amer: >60 mL/min (ref >60)
GFR calc non Af Amer: >60 mL/min (ref >60)
Glucose, Bld: 198 mg/dL — ABNORMAL HIGH (ref 70–99)
Potassium: 5 mmol/L (ref 3.5–5.1)
Sodium: 133 mmol/L — ABNORMAL LOW (ref 135–145)

## 2018-05-21 MED ORDER — LACTULOSE 10 GM/15ML PO SOLN
20.0000 g | Freq: Two times a day (BID) | ORAL | Status: DC
  Administered 2018-05-21 – 2018-05-22 (×3): 20 g
  Filled 2018-05-21 (×3): qty 30

## 2018-05-21 MED ORDER — OSMOLITE 1.5 CAL PO LIQD
1000.0000 mL | ORAL | Status: DC
  Administered 2018-05-21 – 2018-05-23 (×3): 1000 mL
  Filled 2018-05-21 (×4): qty 1000

## 2018-05-21 NOTE — Progress Notes (Signed)
PROGRESS NOTE    [name]  HTD:[id number] DOB: October 11, 1945 DOA: 05/04/2018 PCP: Celene Squibb, MD   Brief Narrative:  [name] is a 73 year old male with history of metastatic prostate cancer, Liver cirrhosis, diabetes mellitus, hypertension, alcohol abuse, tobacco abuse was admitted to neurosurgery on 4/29 with right intraparenchymal hematoma, subdural hematoma and a C2 fracture. -He had been awaiting SNF placement, through his hospitalization he was noted to have increased confusion and agitation as well as respiratory distress. -TRH was consulted 5/3 due to increased agitation and respiratory distres -Also noted to have hypoglycemia, pulmonary edema and pleural effusions 5/4: TRH took over from [facility] course notable for alcohol withdrawal and prolonged delirium, continues to be encephalopathic with dysphagia -Core track placed, started on tube feeds -Palliative consulted as well  Assessment & Plan:   Right intraparenchymal hematoma, extensive right subdural hematoma and  intraventricular hemorrhage with traumatic C2 fracture -Following a fall -has chronic thrombocytopenia from liver cirrhosis -Admitted by neurosurgery, nonoperative management recommended  -Remains in c-collar  -Repeat CT head from 5/11 overall stable and improving, slight increase in right extra-axial hematoma noted  Encephalopathy /acute alcohol withdrawal  and prolonged delirium -Alcohol level was markedly elevated on admission at 212 -On day 2 of hospitalization patient was noted to have increased agitation and confusion consistent with timeline for acute withdrawal -Treated for alcohol withdrawal with high doses of Ativan, however continued to remain encephalopathic, CT head is stable -Was weaned off Ativan -Subsequently noted to have prolonged ICU delirium -Maintained on thiamine, he was started on low-dose Seroquel last week however this may be contributing to a small extent to his  lethargy, we discontinued Seroquel 5/15  -Given mild improvement in mentation he underwent MBS 5/14 and was started on a dysphagia 1 diet with honey thick liquids  -Continue tube feeds for now will cut down rate, continue ongoing palliative care discussions -Attempt pured diet as tolerated -Ammonia level was mildly elevated this at 40, will start lactulose as well  Fluid overload -Likely secondary to liver cirrhosis with hypoalbuminemia and third spacing -Chest x-ray and CTA chest 5/3 was consistent with pulmonary edema and pleural effusions -COVID PCR is negative -Off IV fluids, given Lasix 5/3 and 5/4 -Appears euvolemic now, monitor off diuretics  Liver Cirrhosis -Suspect alcoholic liver disease given long history of alcoholism -Chronic thrombocytopenia associated with liver disease -Given IV Lasix earlier this admission, now euvolemic, monitor  Heavy tobacco abuse/suspected COPD -Per niece smoked 2 packs/day for 50 years, i.e. 100 pack year -Likely has underlying COPD from this -Respiratory status overall stable  DM -Holding Metformin -Moderate-scale SSI  HTN -On Lisinopril  Metastatic Prostate CA -s/p seed implants -Recent PSA reportedly normal  DVT prophylaxis: SCDs Code Status: DNR  Family Communication: No family at bedside, called and updated niece [name] 5/14 Disposition Plan:  Likely SNF with palliative care follow-up if he improves  Consultants:   Neuro surgery. Dr Kathyrn Sheriff  Palliative care     Procedures: none.    Antimicrobials: none.    Subjective: -No events overnight, was somnolent most of the day, later in the evening appeared to have some moments of lucidity  Objective: Vitals:   05/20/18 2359 05/21/18 0321 05/21/18 0813 05/21/18 1253  BP: (!) 165/76 (!) 144/77 (!) 150/97 122/78  Pulse: 90 84 99 97  Resp: (!) 21 (!) 22    Temp: 98.4 F (36.9 C) 98.3 F (36.8 C) 98.8 F (37.1 C) 98 F (36.7 C)  TempSrc: Axillary  Axillary  Axillary Axillary  SpO2: 95% 97% 94% 100%  Weight:      Height:        Intake/Output Summary (Last 24 hours) at 05/21/2018 1312 Last data filed at 05/21/2018 0700 Gross per 24 hour  Intake --  Output 1050 ml  Net -1050 ml   Filed Weights   05/16/18 0425 05/17/18 0500 05/18/18 0500  Weight: 89.2 kg 84.3 kg 80.7 kg    Examination: Gen: Awake, Alert, oriented to self only, more lucid this morning HEENT: PERRLA, Neck supple, no JVD Lungs: Decreased breath sounds at both bases CVS: RRR,No Gallops,Rubs or new Murmurs Abd: soft, Non tender, non distended, BS present Extremities: No edema Skin: no new rashes Neuro, more alert, less confused, moves all extremities involuntarily Psychiatry: cannot be assessed.      Data Reviewed: I have personally reviewed following labs and imaging studies  CBC: Recent Labs  Lab 05/19/18 0511 05/20/18 0341 05/21/18 0533  WBC 6.2 5.4 5.1  HGB 13.5 12.7* 12.2*  HCT 38.0* 36.1* 34.9*  MCV 92.0 92.3 92.1  PLT 106* 113* 856*   Basic Metabolic Panel: Recent Labs  Lab 05/15/18 0245 05/16/18 0554 05/17/18 0158 05/19/18 0511 05/20/18 0341 05/21/18 0533  NA 138 137 137 135 130* 133*  K 3.7 4.4 4.8 4.7 4.6 5.0  CL 100 98 96* 100 96* 96*  CO2 28 28 29 25 25 26   GLUCOSE 172* 179* 174* 200* 198* 198*  BUN 12 11 14  28* 31* 29*  CREATININE 0.77 0.77 0.83 1.28* 1.13 0.92  CALCIUM 8.6* 8.5* 8.8* 8.8* 8.5* 8.8*  MG 2.1 1.9 1.9  --   --   --   PHOS 3.0 3.5 3.8  --   --   --    GFR: Estimated Creatinine Clearance: 79.7 mL/min (by C-G formula based on SCr of 0.92 mg/dL). Liver Function Tests: No results for input(s): AST, ALT, ALKPHOS, BILITOT, PROT, ALBUMIN in the last 168 hours. No results for input(s): LIPASE, AMYLASE in the last 168 hours. Recent Labs  Lab 05/20/18 1337  AMMONIA 40*   Coagulation Profile: No results for input(s): INR, PROTIME in the last 168 hours. Cardiac Enzymes: No results for input(s): CKTOTAL, CKMB, CKMBINDEX,  TROPONINI in the last 168 hours. BNP (last 3 results) No results for input(s): PROBNP in the last 8760 hours. HbA1C: No results for input(s): HGBA1C in the last 72 hours. CBG: Recent Labs  Lab 05/20/18 2108 05/20/18 2355 05/21/18 0313 05/21/18 0741 05/21/18 1224  GLUCAP 267* 242* 172* 208* 182*   Lipid Profile: No results for input(s): CHOL, HDL, LDLCALC, TRIG, CHOLHDL, LDLDIRECT in the last 72 hours. Thyroid Function Tests: No results for input(s): TSH, T4TOTAL, FREET4, T3FREE, THYROIDAB in the last 72 hours. Anemia Panel: No results for input(s): VITAMINB12, FOLATE, FERRITIN, TIBC, IRON, RETICCTPCT in the last 72 hours. Sepsis Labs: No results for input(s): PROCALCITON, LATICACIDVEN in the last 168 hours.  No results found for this or any previous visit (from the past 240 hour(s)).       Radiology Studies: Dg Swallowing Func-speech Pathology  Result Date: 05/19/2018 Objective Swallowing Evaluation: Type of Study: MBS-Modified Barium Swallow Study  Patient Details Name: JAMICAH ANSTEAD MRN: [id number] Date of Birth: [birth year] Today's Date: 05/19/2018 Time: SLP Start Time (ACUTE ONLY): 1331 -SLP Stop Time (ACUTE ONLY): 1343 SLP Time Calculation (min) (ACUTE ONLY): 12 min Past Medical History: Past Medical History: Diagnosis Date  Anxiety   Benign localized prostatic hyperplasia with lower urinary tract symptoms (  LUTS)   Depression   Diverticulosis of colon   GERD (gastroesophageal reflux disease)   History of colitis   History of colonic diverticulitis   03-10-2006 s/p bowel resection   History of deep vein thrombosis (DVT) of lower extremity 06/2006  bilateral femoral/   04-08-2017 per pt had several dvt's in 2008  Hypertension   Hypertriglyceridemia   NAFLD (nonalcoholic fatty liver disease)   OA (osteoarthritis)   knees  Peripheral neuropathy   Prostate cancer [facility]) urologist-- dr Alyson Ingles  oncologist- dr Tammi Klippel  dx 12-02-2016-- Stage T1c, Gleason 3+4,  PSA 8.2,  37.8cc-- scheduled for radioactive seed implants 04-16-2017  S/P insertion of IVC (inferior vena caval) filter 07/02/2006  due to bilateral fermoral dvt  Seasonal allergies   Thrombocytopenia ([place])   Type 2 diabetes mellitus ([place])   followed by pcp  Weak urinary stream   Wears glasses  Past Surgical History: Past Surgical History: Procedure Laterality Date  CATARACT EXTRACTION W/ INTRAOCULAR LENS  IMPLANT, BILATERAL  2010  CHOLECYSTECTOMY OPEN  1973  and APPENDECTOMY  COLONOSCOPY N/A 06/01/2012  Procedure: COLONOSCOPY;  Surgeon: Rogene Houston, MD;  Location: [facility];  Service: Endoscopy;  Laterality: N/A;  930-moved to 1025 Ann to notify pt  IVC FILTER INSERTION  07/02/2006  PROSTATE BIOPSY  12-02-2016   dr Alyson Ingles office  RADIOACTIVE SEED IMPLANT N/A 04/16/2017  Procedure: RADIOACTIVE SEED IMPLANT/BRACHYTHERAPY IMPLANT;  Surgeon: Cleon Gustin, MD;  Location[facility];  Service: Urology;  Laterality: N/A;  SIGMOID RESECTION W/ STAPLED SIDE TO SIDE ANASTOMOSIS/ DRAINAGE ABSCESS  03-10-2006  dr Romona Curls [facility]  SPACE OAR INSTILLATION N/A 04/16/2017  Procedure: SPACE OAR INSTILLATION;  Surgeon: Cleon Gustin, MD;  Location: [facility];  Service: Urology;  Laterality: N/A; HPI: KANE KUSEK is a 73 y.o. male with history of diabetes, GERD, hyperlipidemia, hypertension, prostate cancer, cirrhosis who was brought to ED after a fall. CT head/C spine which is significant for an acute right SDH and C2 fracture. Repeat CT 4/30 revealed worsening findings: Some increased blooming of the right temporal intraparenchymal hematoma, measuring 5 x 3.3 x 2.2 cm today compared with 4.4 x 2.7 x 1.9 cm on the immediate previous film. Slight increase in size of the left frontoparietal vertex contusions, increased by a mm or 2, measuring 7 and 10 mm presently. CXR No acute cardiopulmonary disease. MBS 05/06/18 with recommendation of puree and nectar. Susequently pt  aspirated, made NPO. Pt appropriate for repeat MBS today.  Subjective: pt in chair, had been fully awake with pt/ot at edge of bed 15 minutes prior Assessment / Plan / Recommendation CHL IP CLINICAL IMPRESSIONS 05/19/2018 Clinical Impression Pt was alert and exhibited mild oropharyngeal dysphagia with adequate airway protection during MBS. Decreased oral control and manipulation led to lingual residue, delayed propulsion and holding. Sensation and swallow initiation was timely with one instance of nectar barium reaching pyriform sinuses before swallow triggered. Question mildly decreased epiglottic deflection in combination of presence of Cortrak due to maximum vallecular residue with puree and mild-mod pyriform sinus. Initially honey thick yielded mod pharyngeal residue fading to mild-mod at end of study. Pt's cognition and alertness have waxed and waned significantly therefore pt must consume po's only when fully alert and full supervision with Dys 1, honey thick, crush meds and check for oral pocketing.  SLP Visit Diagnosis Dysphagia, oropharyngeal phase (R13.12) Attention and concentration deficit following -- Frontal lobe and executive function deficit following -- Impact on safety and function  Mild aspiration risk;Moderate aspiration risk   CHL IP TREATMENT RECOMMENDATION 05/19/2018 Treatment Recommendations Therapy as outlined in treatment plan below   Prognosis 05/19/2018 Prognosis for Safe Diet Advancement (No Data) Barriers to Reach Goals -- Barriers/Prognosis Comment -- CHL IP DIET RECOMMENDATION 05/19/2018 SLP Diet Recommendations Dysphagia 1 (Puree) solids;Honey thick liquids Liquid Administration via Cup;No straw Medication Administration Crushed with puree Compensations Slow rate;Small sips/bites;Lingual sweep for clearance of pocketing;Multiple dry swallows after each bite/sip Postural Changes Seated upright at 90 degrees   CHL IP OTHER RECOMMENDATIONS 05/19/2018 Recommended Consults -- Oral Care  Recommendations Oral care BID Other Recommendations Order thickener from pharmacy   CHL IP FOLLOW UP RECOMMENDATIONS 05/19/2018 Follow up Recommendations Skilled Nursing facility   [facility] IP FREQUENCY AND DURATION 05/19/2018 Speech Therapy Frequency (ACUTE ONLY) min 2x/week Treatment Duration 2 weeks      CHL IP ORAL PHASE 05/19/2018 Oral Phase Impaired Oral - Pudding Teaspoon -- Oral - Pudding Cup -- Oral - Honey Teaspoon -- Oral - Honey Cup Holding of bolus;Delayed oral transit Oral - Nectar Teaspoon NT Oral - Nectar Cup Delayed oral transit;Holding of bolus;Lingual/palatal residue Oral - Nectar Straw NT Oral - Thin Teaspoon -- Oral - Thin Cup NT Oral - Thin Straw NT Oral - Puree Delayed oral transit;Holding of bolus Oral - Mech Soft NT Oral - Regular -- Oral - Multi-Consistency -- Oral - Pill -- Oral Phase - Comment --  CHL IP PHARYNGEAL PHASE 05/19/2018 Pharyngeal Phase Impaired Pharyngeal- Pudding Teaspoon -- Pharyngeal -- Pharyngeal- Pudding Cup -- Pharyngeal -- Pharyngeal- Honey Teaspoon -- Pharyngeal -- Pharyngeal- Honey Cup Pharyngeal residue - valleculae;Pharyngeal residue - pyriform;Reduced epiglottic inversion Pharyngeal -- Pharyngeal- Nectar Teaspoon NT Pharyngeal -- Pharyngeal- Nectar Cup Pharyngeal residue - valleculae;Pharyngeal residue - pyriform;Delayed swallow initiation-pyriform sinuses Pharyngeal -- Pharyngeal- Nectar Straw NT Pharyngeal -- Pharyngeal- Thin Teaspoon NT Pharyngeal -- Pharyngeal- Thin Cup NT Pharyngeal -- Pharyngeal- Thin Straw -- Pharyngeal -- Pharyngeal- Puree Pharyngeal residue - valleculae;Pharyngeal residue - pyriform;Reduced epiglottic inversion Pharyngeal -- Pharyngeal- Mechanical Soft NT Pharyngeal -- Pharyngeal- Regular -- Pharyngeal -- Pharyngeal- Multi-consistency -- Pharyngeal -- Pharyngeal- Pill -- Pharyngeal -- Pharyngeal Comment --  CHL IP CERVICAL ESOPHAGEAL PHASE 05/19/2018 Cervical Esophageal Phase WFL Pudding Teaspoon -- Pudding Cup -- Honey Teaspoon -- Honey Cup --  Nectar Teaspoon -- Nectar Cup -- Nectar Straw -- Thin Teaspoon -- Thin Cup -- Thin Straw -- Puree -- Mechanical Soft -- Regular -- Multi-consistency -- Pill -- Cervical Esophageal Comment -- [name] 05/19/2018, 3:12 PM  Orbie Pyo Litaker M.Ed Risk analyst [phone] Office [phone]                  Scheduled Meds:  sodium chloride   Intravenous Once   bacitracin   Topical BID   chlorhexidine  15 mL Mouth Rinse BID   feeding supplement (PRO-STAT SUGAR FREE 64)  30 mL Per Tube Daily   folic acid  1 mg Per Tube Daily   free water  300 mL Per Tube Q8H   Gerhardt's butt cream   Topical TID   insulin aspart  0-15 Units Subcutaneous Q4H   lactulose  20 g Per Tube BID   mouth rinse  15 mL Mouth Rinse q12n4p   nicotine  14 mg Transdermal Daily   pravastatin  20 mg Per Tube QHS   thiamine  100 mg Per Tube Daily   Continuous Infusions:  feeding supplement (OSMOLITE 1.5 CAL) 1,000 mL (05/21/18 0023)     LOS: 17  days    Time spent: 25 minutes.     Domenic Polite, MD [facility]  05/21/2018, 1:12 PM

## 2018-05-22 LAB — GLUCOSE, CAPILLARY
Glucose-Capillary: 193 mg/dL — ABNORMAL HIGH (ref 70–99)
Glucose-Capillary: 170 mg/dL — ABNORMAL HIGH (ref 70–99)
Glucose-Capillary: 157 mg/dL — ABNORMAL HIGH (ref 70–99)
Glucose-Capillary: 208 mg/dL — ABNORMAL HIGH (ref 70–99)
Glucose-Capillary: 184 mg/dL — ABNORMAL HIGH (ref 70–99)
Glucose-Capillary: 235 mg/dL — ABNORMAL HIGH (ref 70–99)
Glucose-Capillary: 209 mg/dL — ABNORMAL HIGH (ref 70–99)

## 2018-05-22 MED ORDER — LACTULOSE 10 GM/15ML PO SOLN
20.0000 g | Freq: Two times a day (BID) | ORAL | Status: DC
  Administered 2018-05-23: 20 g
  Filled 2018-05-22: qty 30

## 2018-05-22 NOTE — Progress Notes (Addendum)
PROGRESS NOTE    [name]  KCL:[id number] DOB: 10-Nov-1945 DOA: 05/04/2018 PCP: Celene Squibb, MD   Brief Narrative:  [name] is a 73 year old male with history of metastatic prostate cancer, Liver cirrhosis, diabetes mellitus, hypertension, alcohol abuse, tobacco abuse was admitted to neurosurgery on 4/29 with right intraparenchymal hematoma, subdural hematoma and a C2 fracture. -He had been awaiting SNF placement, through his hospitalization he was noted to have increased confusion and agitation as well as respiratory distress. -TRH was consulted 5/3 due to increased agitation and respiratory distres -Also noted to have hypoglycemia, pulmonary edema and pleural effusions 5/4: TRH took over from [facility] course notable for alcohol withdrawal and prolonged delirium, continues to be encephalopathic with dysphagia -Core track placed, started on tube feeds -Palliative consulted as well  Assessment & Plan:   Right intraparenchymal hematoma, extensive right subdural hematoma and  intraventricular hemorrhage with traumatic C2 fracture -Following a fall -has chronic thrombocytopenia from liver cirrhosis -Admitted by neurosurgery, nonoperative management recommended  -Remains in c-collar  -Repeat CT head from 5/11 overall stable and improving, slight increase in right extra-axial hematoma noted  Encephalopathy /acute alcohol withdrawal  and prolonged delirium -Alcohol level was markedly elevated on admission at 212 -On day 2 of hospitalization patient was noted to have increased agitation and confusion consistent with timeline for acute withdrawal -Treated for alcohol withdrawal with high doses of Ativan, however continued to remain encephalopathic, CT head is stable -Was weaned off Ativan -Subsequently noted to have prolonged ICU delirium -Maintained on thiamine, he was started on low-dose Seroquel last week however this may be contributing to a small extent to his  lethargy, we discontinued Seroquel 5/15  -Given mild improvement in mentation he underwent MBS 5/14 and was started on a dysphagia 1 diet with honey thick liquids  -Continue tube feeds for now will cut down rate, continue ongoing palliative care discussions -Attempt pured diet as tolerated, had 20-30% for breakfast today -Ammonia level was mildly elevated at 40, continue lactulose will decrease dose due to diarrhea -Has made minimal progress in the last 19 days in the hospital, if no considerable improvement and unable to maintain oral intake, adequate nutrition POA is considering hospice  Fluid overload -Likely secondary to liver cirrhosis with hypoalbuminemia and third spacing -Chest x-ray and CTA chest 5/3 was consistent with pulmonary edema and pleural effusions -COVID PCR is negative -Off IV fluids, given Lasix 5/3 and 5/4 -Appears euvolemic now, monitor off diuretics  Liver Cirrhosis -Suspect alcoholic liver disease given long history of alcoholism -Chronic thrombocytopenia associated with liver disease -Given IV Lasix earlier this admission, now euvolemic, monitor  Heavy tobacco abuse/suspected COPD -Per niece smoked 2 packs/day for 50 years, i.e. 100 pack year -Likely has underlying COPD from this -Respiratory status overall stable  DM -Holding Metformin -Moderate-scale SSI  HTN -On Lisinopril  Metastatic Prostate CA -s/p seed implants -Recent PSA reportedly normal  DVT prophylaxis: SCDs Code Status: DNR  Family Communication: No family at bedside, called and updated niece [name] 5/15 and 5/17 Disposition Plan:  Likely SNF with palliative care follow-up if he improves otherwise Hospice   Consultants:   Neuro surgery. Dr Kathyrn Sheriff  Palliative care     Procedures: none.    Antimicrobials: none.    Subjective: -No events overnight, slightly more alert this morning, was not able to tolerate any oral intake overnight  Objective: Vitals:   05/22/18  0310 05/22/18 0736 05/22/18 1147 05/22/18 1200  BP: (!) 149/74 (!) 145/80 Marland Kitchen)  143/85 131/80  Pulse: 96 93 99   Resp: 18 18 18    Temp: 97.8 F (36.6 C) 98.8 F (37.1 C) 99.4 F (37.4 C)   TempSrc: Axillary Oral Oral   SpO2: 97% 99% 98%   Weight:      Height:        Intake/Output Summary (Last 24 hours) at 05/22/2018 1401 Last data filed at 05/22/2018 0930 Gross per 24 hour  Intake 6932 ml  Output 750 ml  Net 6182 ml   Filed Weights   05/16/18 0425 05/17/18 0500 05/18/18 0500  Weight: 89.2 kg 84.3 kg 80.7 kg    Examination: Gen: Awake alert oriented to self, partly to place knows he is in the hospital, disoriented to time HEENT: PERRLA, Neck supple, no JVD Lungs: Decreased breath sounds at both bases  CVS: RRR,No Gallops,Rubs or new Murmurs Abd: soft, Non tender, non distended, BS present Extremities: No edema Skin: no new rashes Neuro, more alert, less confused, moves all extremities involuntarily Psychiatry: cannot be assessed.      Data Reviewed: I have personally reviewed following labs and imaging studies  CBC: Recent Labs  Lab 05/19/18 0511 05/20/18 0341 05/21/18 0533  WBC 6.2 5.4 5.1  HGB 13.5 12.7* 12.2*  HCT 38.0* 36.1* 34.9*  MCV 92.0 92.3 92.1  PLT 106* 113* 417*   Basic Metabolic Panel: Recent Labs  Lab 05/16/18 0554 05/17/18 0158 05/19/18 0511 05/20/18 0341 05/21/18 0533  NA 137 137 135 130* 133*  K 4.4 4.8 4.7 4.6 5.0  CL 98 96* 100 96* 96*  CO2 28 29 25 25 26   GLUCOSE 179* 174* 200* 198* 198*  BUN 11 14 28* 31* 29*  CREATININE 0.77 0.83 1.28* 1.13 0.92  CALCIUM 8.5* 8.8* 8.8* 8.5* 8.8*  MG 1.9 1.9  --   --   --   PHOS 3.5 3.8  --   --   --    GFR: Estimated Creatinine Clearance: 79.7 mL/min (by C-G formula based on SCr of 0.92 mg/dL). Liver Function Tests: No results for input(s): AST, ALT, ALKPHOS, BILITOT, PROT, ALBUMIN in the last 168 hours. No results for input(s): LIPASE, AMYLASE in the last 168 hours. Recent Labs  Lab  05/20/18 1337  AMMONIA 40*   Coagulation Profile: No results for input(s): INR, PROTIME in the last 168 hours. Cardiac Enzymes: No results for input(s): CKTOTAL, CKMB, CKMBINDEX, TROPONINI in the last 168 hours. BNP (last 3 results) No results for input(s): PROBNP in the last 8760 hours. HbA1C: No results for input(s): HGBA1C in the last 72 hours. CBG: Recent Labs  Lab 05/21/18 2319 05/22/18 0222 05/22/18 0441 05/22/18 0725 05/22/18 1201  GLUCAP 220* 193* 170* 157* 208*   Lipid Profile: No results for input(s): CHOL, HDL, LDLCALC, TRIG, CHOLHDL, LDLDIRECT in the last 72 hours. Thyroid Function Tests: No results for input(s): TSH, T4TOTAL, FREET4, T3FREE, THYROIDAB in the last 72 hours. Anemia Panel: No results for input(s): VITAMINB12, FOLATE, FERRITIN, TIBC, IRON, RETICCTPCT in the last 72 hours. Sepsis Labs: No results for input(s): PROCALCITON, LATICACIDVEN in the last 168 hours.  No results found for this or any previous visit (from the past 240 hour(s)).       Radiology Studies: No results found.      Scheduled Meds:  sodium chloride   Intravenous Once   bacitracin   Topical BID   chlorhexidine  15 mL Mouth Rinse BID   feeding supplement (PRO-STAT SUGAR FREE 64)  30 mL Per Tube Daily  folic acid  1 mg Per Tube Daily   free water  300 mL Per Tube Q8H   Gerhardt's butt cream   Topical TID   insulin aspart  0-15 Units Subcutaneous Q4H   lactulose  20 g Per Tube BID   mouth rinse  15 mL Mouth Rinse q12n4p   nicotine  14 mg Transdermal Daily   pravastatin  20 mg Per Tube QHS   thiamine  100 mg Per Tube Daily   Continuous Infusions:  feeding supplement (OSMOLITE 1.5 CAL) 1,000 mL (05/21/18 2130)     LOS: 18 days    Time spent: 25 minutes.     Domenic Polite, MD [facility]  05/22/2018, 2:01 PM

## 2018-05-22 NOTE — Progress Notes (Signed)
Spoke with MD about tube feedings during the day. MD suggested keeping tube feedings at 20 during the day shift and increasing back after dinner.

## 2018-05-22 NOTE — Progress Notes (Addendum)
Upon assessment, RN visualized that pt has sore in R nostril where the cortrak has embedded into pts nose. MD notified. Will continue to monitor.

## 2018-05-23 DIAGNOSIS — Z515 Encounter for palliative care: Secondary | ICD-10-CM

## 2018-05-23 DIAGNOSIS — S12100A Unspecified displaced fracture of second cervical vertebra, initial encounter for closed fracture: Secondary | ICD-10-CM

## 2018-05-23 DIAGNOSIS — R1310 Dysphagia, unspecified: Secondary | ICD-10-CM

## 2018-05-23 LAB — GLUCOSE, CAPILLARY
Glucose-Capillary: 140 mg/dL — ABNORMAL HIGH (ref 70–99)
Glucose-Capillary: 155 mg/dL — ABNORMAL HIGH (ref 70–99)
Glucose-Capillary: 169 mg/dL — ABNORMAL HIGH (ref 70–99)
Glucose-Capillary: 182 mg/dL — ABNORMAL HIGH (ref 70–99)
Glucose-Capillary: 186 mg/dL — ABNORMAL HIGH (ref 70–99)
Glucose-Capillary: 192 mg/dL — ABNORMAL HIGH (ref 70–99)

## 2018-05-23 LAB — CBC
HCT: 34.2 % — ABNORMAL LOW (ref 39.0–52.0)
Hemoglobin: 12 g/dL — ABNORMAL LOW (ref 13.0–17.0)
MCH: 31.7 pg (ref 26.0–34.0)
MCHC: 35.1 g/dL (ref 30.0–36.0)
MCV: 90.5 fL (ref 80.0–100.0)
Platelets: 127 10*3/uL — ABNORMAL LOW (ref 150–400)
RBC: 3.78 MIL/uL — ABNORMAL LOW (ref 4.22–5.81)
RDW: 13 % (ref 11.5–15.5)
WBC: 5.8 10*3/uL (ref 4.0–10.5)
nRBC: 0 % (ref 0.0–0.2)

## 2018-05-23 LAB — BASIC METABOLIC PANEL
Anion gap: 9 (ref 5–15)
BUN: 25 mg/dL — ABNORMAL HIGH (ref 8–23)
CO2: 25 mmol/L (ref 22–32)
Calcium: 8.7 mg/dL — ABNORMAL LOW (ref 8.9–10.3)
Chloride: 98 mmol/L (ref 98–111)
Creatinine, Ser: 0.92 mg/dL (ref 0.61–1.24)
GFR calc Af Amer: >60 mL/min (ref >60)
GFR calc non Af Amer: >60 mL/min (ref >60)
Glucose, Bld: 180 mg/dL — ABNORMAL HIGH (ref 70–99)
Potassium: 4.2 mmol/L (ref 3.5–5.1)
Sodium: 132 mmol/L — ABNORMAL LOW (ref 135–145)

## 2018-05-23 MED ORDER — OXYMETAZOLINE HCL 0.05 % NA SOLN
1.0000 | Freq: Once | NASAL | Status: AC
  Administered 2018-05-23: 1 via NASAL
  Filled 2018-05-23: qty 30

## 2018-05-23 MED ORDER — LACTULOSE 10 GM/15ML PO SOLN
20.0000 g | Freq: Two times a day (BID) | ORAL | Status: DC
  Administered 2018-05-23 – 2018-05-24 (×2): 20 g via ORAL
  Filled 2018-05-23 (×2): qty 30

## 2018-05-23 NOTE — Progress Notes (Signed)
Nutrition Brief Note  Chart reviewed. Per MD, plan for hospice in [place]. Family does not want PEG. Per Palliative, plans to shift towards comfort. Tube feeding orders have been discontinued via MD. No further nutrition interventions warranted at this time.  Please re-consult as needed.   Corrin Parker, MS, RD, LDN Pager # [phone] After hours/ weekend pager # [phone]

## 2018-05-23 NOTE — Consult Note (Signed)
[place] Nurse wound consult note Reason for Consult: toes and ankle wounds Wound type: abrasions, most likely occurred when he fell.  RN also ask about Cortrak tube eroding nare Pressure Injury POA:NA Measurement:pinpoint areas on the lateral right ankle and right 3-5 toes Wound HCS:[id number]  Drainage (amount, consistency, odor) none Periwound: intact, distal pulses palpable   Dressing procedure/placement/frequency:  No topical care recommendations for the ankle or toe wounds.  Leave open to air  Soft silicone foam dressings in place to protect areas on the heels and ankles.  Recommended bedside nurse to contact Cortrak team to investigate if the tube can be removed since he will be going to hospice care.   Discussed POC with patient and bedside nurse.  Re consult if needed, will not follow at this time. Thanks  Patricie Geeslin [employer], RN,CWOCN, CNS, [place] [phone])

## 2018-05-23 NOTE — Progress Notes (Signed)
Daily Progress Note   Patient Name: [name]       Date: 05/23/2018 DOB: 04/17/1945  Age: 73 y.o. MRN#: [id number] Attending Physician: Domenic Polite, MD Primary Care Physician: Celene Squibb, MD Admit Date: 05/04/2018  Reason for Consultation/Follow-up: Establishing goals of care, Inpatient hospice referral and Psychosocial/spiritual support  Subjective: Reviewed Epic, examined patient, discussed with Dr. Broadus John and patient's niece [name].  Patient is eating only bites and sips, bedbound, terribly deconditioned.  He acknowledges me but appears too weak to speak with me.  [name] was a very independent man who would never be happy in a nursing facility.  Assessment: Admitted with fall / SDH / C2 fracture. Failing to thrive. Bedbound.  Deconditioned.  Aspirated. Having difficulty with pulmonary edema. Family does not want PEG.  He would not be happy being dependent on others for ADLs.  Patient taking sips and bites.   Patient Profile/HPI:  73 y.o. male  with past medical history of DM, HLD, HTN, liver cirrhosis, prostate cancer, smoker, ETOH use admitted on 05/04/2018 after fall with alcohol intoxication. Initially admitted to neurosurgery with right intraparenchymal hematoma, subdural hematoma and C2 fracture. TRH consulted on 5/3 due to increased agitation and respiratory distress, felt related to alcohol withdrawal. Ativan initiated. Also noted to have hypoglycemia, pulmonary edema and pleural effusions. Repeat CT head 5/4 stable. Ongoing encephalopathy and agitation. Unable to work with SLP. Cortrak to be placed 5/6.   Length of Stay: 19  Current Medications: Scheduled Meds:  . sodium chloride   Intravenous Once  . bacitracin   Topical BID  . chlorhexidine  15 mL Mouth Rinse BID  .  free water  300 mL Per Tube Q8H  . Gerhardt's butt cream   Topical TID  . insulin aspart  0-15 Units Subcutaneous Q4H  . lactulose  20 g Per Tube BID  . mouth rinse  15 mL Mouth Rinse q12n4p  . nicotine  14 mg Transdermal Daily    Continuous Infusions:   PRN Meds: acetaminophen **OR** acetaminophen, bisacodyl, calcium carbonate, hydrALAZINE, insulin aspart, loperamide, ondansetron **OR** ondansetron (ZOFRAN) IV, Resource ThickenUp Clear, sodium chloride flush, sodium phosphate  Physical Exam        Thin, ill appearing male.  Eyes open and responsive, but he does not speak.  In hard collar. CV irreg  irreg Resp no distress Abdomen thin  Vital Signs: BP 139/76   Pulse 96   Temp 98.7 F (37.1 C)   Resp (!) 24   Ht 6' 0.52" (1.842 m)   Wt 80.7 kg   SpO2 96%   BMI 23.78 kg/m  SpO2: SpO2: 96 % O2 Device: O2 Device: Room Air O2 Flow Rate: O2 Flow Rate (L/min): 2 L/min  Intake/output summary:   Intake/Output Summary (Last 24 hours) at 05/23/2018 1340 Last data filed at 05/23/2018 1200 Gross per 24 hour  Intake 813.17 ml  Output 552 ml  Net 261.17 ml   LBM: Last BM Date: 05/23/18 Baseline Weight: Weight: 88.2 kg Most recent weight: Weight: 80.7 kg       Palliative Assessment/Data: 20%   Flowsheet Rows     Most Recent Value  Intake Tab  Referral Department  Hospitalist  Unit at Time of Referral  Intermediate Care Unit  Palliative Care Primary Diagnosis  Neurology  Date Notified  05/11/18  Palliative Care Type  New Palliative care  Reason for referral  Non-pain Symptom, Clarify Goals of Care  Date of Admission  05/04/18  Date first seen by Palliative Care  05/11/18  # of days Palliative referral response time  0 Day(s)  # of days IP prior to Palliative referral  7  Clinical Assessment  Palliative Performance Scale Score  30%  Psychosocial & Spiritual Assessment  Palliative Care Outcomes  Patient/Family meeting held?  Yes  Who was at the meeting?  niece via  telephone  Palliative Care Outcomes  Clarified goals of care, Provided end of life care assistance, Provided psychosocial or spiritual support, ACP counseling assistance      Patient Active Problem List   Diagnosis Date Noted  . Dysphagia   . Palliative care by specialist   . Goals of care, counseling/discussion   . Alcoholic intoxication with complication ([place])   . Thrombocytopenia ([place])   . SAH (subarachnoid hemorrhage) ([place])   . Agitation   . Respiratory distress 05/08/2018  . Alcohol abuse 05/08/2018  . Subdural hematoma ([place]) 05/04/2018  . Prostate cancer ([place]) 04/16/2017  . Malignant neoplasm of prostate ([place]) 01/07/2017  . Hepatic cirrhosis ([place]) 08/17/2013  . Diverticulosis 07/19/2013  . DIARRHEA 08/22/2008  . COLITIS 08/21/2008  . DIVERTICULITIS, COLON 08/21/2008  . Diabetes mellitus type 2 in nonobese ([place]) 08/20/2008  . HYPERLIPIDEMIA 08/20/2008  . SMOKER 08/20/2008  . Essential hypertension 08/20/2008  . EXTERNAL HEMORRHOIDS 08/20/2008  . COLONIC POLYPS, ADENOMATOUS, HX OF 08/20/2008    Palliative Care Plan    Recommendations/Plan:  Shift towards comfort.   Recommend transfer to [facility] near [place].  Niece feels connected to [place] and wants him there.  Family close by.  Patient will receive excellent nursing care.   Goals of Care and Additional Recommendations:  Limitations on Scope of Treatment: Full Comfort Care  Code Status:  DNR  Prognosis:  Days to weeks.  Not eating enough to support nutrition / hydration.  Extremely deconditioned.  Discharge Planning:  Hospice facility  Care plan was discussed with [facility] MD, Niece.  Thank you for allowing the Palliative Medicine Team to assist in the care of this patient.  Total time spent:   35 min.     Greater than 50%  of this time was spent counseling and coordinating care related to the above assessment and plan.  Florentina Jenny, PA-C Palliative Medicine  Please contact  Palliative MedicineTeam phone at [phone] for questions and concerns between 7  am - 7 pm.   Please see AMION for individual provider pager numbers.

## 2018-05-23 NOTE — Progress Notes (Signed)
Assisted family with camera/video time via elink 

## 2018-05-23 NOTE — Progress Notes (Signed)
Order to remove cortrak. Called Cortrak team who stated that since cortrak bridle had embedded into pts nostril, there is nothing special they can do. Stated they normally ask the MD to remove. Rapid nurse rounding on floor. Asked nurse to assist with removal with chance pts nose would bleed. Afrin spray sprayed into nostril before removal. Cortrak removed. No signs of bleeding. Will continue to monitor.

## 2018-05-23 NOTE — Progress Notes (Signed)
  Speech Language Pathology Treatment: Dysphagia  Patient Details Name: [name] MRN: [id number] DOB: November 24, 1945 Today's Date: 05/23/2018 Time: 5400-8676 SLP Time Calculation (min) (ACUTE ONLY): 9 min  Assessment / Plan / Recommendation Clinical Impression  Pt sleepy although not sedated and consumed sips honey thick juice with hand over hand tactile assistance. Oral transit swift without indications of delayed onset of pharyngeal swallow. Assisted in removal of labial residue. Limited trials offered given sleepiness. He is continuing to receive tube feedings which, per note, will be decreased during the day and turned up at night. Will continue to follow.     HPI HPI: [name] is a 73 y.o. male with history of diabetes, GERD, hyperlipidemia, hypertension, prostate cancer, cirrhosis who was brought to ED after a fall. CT head/C spine which is significant for an acute right SDH and C2 fracture. Repeat CT 4/30 revealed worsening findings: Some increased blooming of the right temporal intraparenchymal hematoma, measuring 5 x 3.3 x 2.2 cm today compared with 4.4 x 2.7 x 1.9 cm on the immediate previous film. Slight increase in size of the left frontoparietal vertex contusions, increased by a mm or 2, measuring 7 and 10 mm presently. CXR No acute cardiopulmonary disease. MBS 05/06/18 with recommendation of puree and nectar. Susequently pt aspirated, made NPO. Pt appropriate for repeat MBS today.      SLP Plan  Continue with current plan of care       Recommendations  Diet recommendations: Dysphagia 1 (puree);Honey-thick liquid Liquids provided via: Cup Supervision: Full supervision/cueing for compensatory strategies;Staff to assist with self feeding Compensations: Slow rate;Small sips/bites;Lingual sweep for clearance of pocketing;Multiple dry swallows after each bite/sip Postural Changes and/or Swallow Maneuvers: Seated upright 90 degrees                Oral Care  Recommendations: Oral care BID Follow up Recommendations: Skilled Nursing facility SLP Visit Diagnosis: Dysphagia, oropharyngeal phase (R13.12) Plan: Continue with current plan of care       Harper, Laconda Basich Willis 05/23/2018, 10:47 AM  Orbie Pyo Colvin Caroli.Ed Risk analyst [phone] Office [phone]

## 2018-05-23 NOTE — Progress Notes (Signed)
WOC consult to evaluate sores on pts L posterior ankle, R lateral ankle, and R lateral pinky toe. Will continue to monitor.

## 2018-05-23 NOTE — TOC Progression Note (Signed)
Transition of Care [facility]) - Progression Note    Patient Details  Name: [name] MRN: [id number] Date of Birth: 08-03-1945  Transition of Care [facility]) CM/SW [place], [place] Phone Number: 05/23/2018, 1:47 PM  Clinical Narrative:    Received a call from Haynes Dage, NP with PMT. Pt niece would like referral to [facility]) as she is familiar through fundraising and staff members with the hospice home. This will also allow him to be near to his family.  CSW will make referral to Cassandra with [facility] regarding bed availability.    Expected Discharge Plan: Skilled Nursing Facility Barriers to Discharge: Other (comment)(Patient unable to provide consent due to cognitive status and no current established HCPOA/legal guardian.)  Expected Discharge Plan and Services Expected Discharge Plan: [place] arrangements for the past 2 months: Apartment                                       Social Determinants of Health (SDOH) Interventions    Readmission Risk Interventions No flowsheet data found.

## 2018-05-23 NOTE — Progress Notes (Signed)
PROGRESS NOTE    [name]  NOB:[id number] DOB: 27-Sep-1945 DOA: 05/04/2018 PCP: Celene Squibb, MD   Brief Narrative:  [name] is a 73 year old male with history of metastatic prostate cancer, Liver cirrhosis, diabetes mellitus, hypertension, alcohol abuse, tobacco abuse was admitted to neurosurgery on 4/29 with right intraparenchymal hematoma, subdural hematoma and a C2 fracture. -Subsequently developed acute alcohol withdrawal agitation confusion and delirium -TRH was consulted 5/3 due to increased agitation and respiratory distres -Also noted to have hypoglycemia, pulmonary edema and pleural effusions 5/4: TRH took over from [place] service, he did for alcohol withdrawal. -Hospital course notable for alcohol withdrawal and prolonged delirium, continues to be encephalopathic with dysphagia -Core track placed, started on tube feeds -Palliative consulted as well  Assessment & Plan:   Right intraparenchymal hematoma, extensive right subdural hematoma and  intraventricular hemorrhage with traumatic C2 fracture -Following a fall -has chronic thrombocytopenia from liver cirrhosis -Admitted by neurosurgery, nonoperative management recommended  -Remains in c-collar  -Repeat CT head from 5/11 overall stable and improving, slight increase in right extra-axial hematoma noted  Encephalopathy /acute alcohol withdrawal  and prolonged delirium -Alcohol level was markedly elevated on admission at 212 -On day 2 of hospitalization patient was noted to have increased agitation and confusion consistent with timeline for acute withdrawal -Treated for alcohol withdrawal with high doses of Ativan, however continued to remain encephalopathic, CT head is stable, after 8 days was weaned off Ativan -Subsequently noted to have prolonged ICU delirium -Maintained on thiamine, he was started on low-dose Seroquel last week however this may be contributing to a small extent to his lethargy, we discontinued  Seroquel 5/15  -Given mild improvement in mentation he underwent MBS 5/14 and was started on a dysphagia 1 diet with honey thick liquids  -Palliative medicine was consulted for goals of care -He was getting tube feeds and free water per cortrak, we are weaning off tube feeds now, p.o. intake slightly better however, has not made significant progress in the last 20 days in the hospital  -He was also continued on lactulose since ammonia level was minimally elevated at 38  -Further conversations held with palliative medicine and niece, now plan for consideration of hospice home in [place]  Fluid overload -Likely secondary to liver cirrhosis with hypoalbuminemia and third spacing -Chest x-ray and CTA chest 5/3 was consistent with pulmonary edema and pleural effusions -COVID PCR is negative -Off IV fluids, given Lasix 5/3 and 5/4 -Appears euvolemic now, monitor off diuretics  Liver Cirrhosis -Suspect alcoholic liver disease given long history of alcoholism -Chronic thrombocytopenia associated with liver disease -Given IV Lasix earlier this admission, now euvolemic, monitor  Heavy tobacco abuse/suspected COPD -Per niece smoked 2 packs/day for 50 years, i.e. 100 pack year -Likely has underlying COPD from this -Respiratory status overall stable  DM -Holding Metformin -Moderate-scale SSI  HTN -On Lisinopril  Metastatic Prostate CA -s/p seed implants -Recent PSA reportedly normal  DVT prophylaxis: SCDs Code Status: DNR  Family Communication: No family at bedside, called and updated niece [name] 5/14, 5/15 and 5/17 Disposition Plan:  Plan for hospice in [place]  Consultants:   Neuro surgery. Dr Kathyrn Sheriff  Palliative care     Procedures: none.    Antimicrobials: none.    Subjective: -Ate about 20% of breakfast  Objective: Vitals:   05/23/18 0400 05/23/18 0719 05/23/18 1150 05/23/18 1200  BP: (!) 142/78 (!) 143/88 (!) 154/136 139/76  Pulse: 95 76 96 96   Resp: 18 (!) 22 (!)  22 (!) 24  Temp:  98.2 F (36.8 C) 98.7 F (37.1 C)   TempSrc:      SpO2: 95% 100% 97% 96%  Weight:      Height:        Intake/Output Summary (Last 24 hours) at 05/23/2018 1504 Last data filed at 05/23/2018 1200 Gross per 24 hour  Intake 813.17 ml  Output 550 ml  Net 263.17 ml   Filed Weights   05/16/18 0425 05/17/18 0500 05/18/18 0500  Weight: 89.2 kg 84.3 kg 80.7 kg    Examination:  Gen: Awake, partly alert, oriented to self only HEENT: PERRLA, Neck supple, no JVD Lungs: Decreased breath sounds at both bases  CVS: RRR,No Gallops,Rubs or new Murmurs Abd: soft, Non tender, non distended, BS present Extremities: No edema Skin: no new rashes Neuro, more alert, less confused, moves all extremities involuntarily Psychiatry: cannot be assessed.      Data Reviewed: I have personally reviewed following labs and imaging studies  CBC: Recent Labs  Lab 05/19/18 0511 05/20/18 0341 05/21/18 0533 05/23/18 0046  WBC 6.2 5.4 5.1 5.8  HGB 13.5 12.7* 12.2* 12.0*  HCT 38.0* 36.1* 34.9* 34.2*  MCV 92.0 92.3 92.1 90.5  PLT 106* 113* 111* 073*   Basic Metabolic Panel: Recent Labs  Lab 05/17/18 0158 05/19/18 0511 05/20/18 0341 05/21/18 0533 05/23/18 0046  NA 137 135 130* 133* 132*  K 4.8 4.7 4.6 5.0 4.2  CL 96* 100 96* 96* 98  CO2 29 25 25 26 25   GLUCOSE 174* 200* 198* 198* 180*  BUN 14 28* 31* 29* 25*  CREATININE 0.83 1.28* 1.13 0.92 0.92  CALCIUM 8.8* 8.8* 8.5* 8.8* 8.7*  MG 1.9  --   --   --   --   PHOS 3.8  --   --   --   --    GFR: Estimated Creatinine Clearance: 79.7 mL/min (by C-G formula based on SCr of 0.92 mg/dL). Liver Function Tests: No results for input(s): AST, ALT, ALKPHOS, BILITOT, PROT, ALBUMIN in the last 168 hours. No results for input(s): LIPASE, AMYLASE in the last 168 hours. Recent Labs  Lab 05/20/18 1337  AMMONIA 40*   Coagulation Profile: No results for input(s): INR, PROTIME in the last 168 hours. Cardiac Enzymes:  No results for input(s): CKTOTAL, CKMB, CKMBINDEX, TROPONINI in the last 168 hours. BNP (last 3 results) No results for input(s): PROBNP in the last 8760 hours. HbA1C: No results for input(s): HGBA1C in the last 72 hours. CBG: Recent Labs  Lab 05/22/18 2034 05/22/18 2309 05/23/18 0409 05/23/18 0712 05/23/18 1146  GLUCAP 235* 209* 186* 182* 192*   Lipid Profile: No results for input(s): CHOL, HDL, LDLCALC, TRIG, CHOLHDL, LDLDIRECT in the last 72 hours. Thyroid Function Tests: No results for input(s): TSH, T4TOTAL, FREET4, T3FREE, THYROIDAB in the last 72 hours. Anemia Panel: No results for input(s): VITAMINB12, FOLATE, FERRITIN, TIBC, IRON, RETICCTPCT in the last 72 hours. Sepsis Labs: No results for input(s): PROCALCITON, LATICACIDVEN in the last 168 hours.  No results found for this or any previous visit (from the past 240 hour(s)).       Radiology Studies: No results found.      Scheduled Meds: . sodium chloride   Intravenous Once  . bacitracin   Topical BID  . chlorhexidine  15 mL Mouth Rinse BID  . free water  300 mL Per Tube Q8H  . Gerhardt's butt cream   Topical TID  . insulin aspart  0-15 Units Subcutaneous  Q4H  . lactulose  20 g Per Tube BID  . mouth rinse  15 mL Mouth Rinse q12n4p  . nicotine  14 mg Transdermal Daily  . oxymetazoline  1 spray Each Nare Once   Continuous Infusions:    LOS: 19 days    Time spent: 25 minutes.     Domenic Polite, MD [facility]  05/23/2018, 3:04 PM

## 2018-05-24 DIAGNOSIS — D689 Coagulation defect, unspecified: Secondary | ICD-10-CM

## 2018-05-24 DIAGNOSIS — F1010 Alcohol abuse, uncomplicated: Secondary | ICD-10-CM

## 2018-05-24 LAB — GLUCOSE, CAPILLARY
Glucose-Capillary: 139 mg/dL — ABNORMAL HIGH (ref 70–99)
Glucose-Capillary: 107 mg/dL — ABNORMAL HIGH (ref 70–99)
Glucose-Capillary: 150 mg/dL — ABNORMAL HIGH (ref 70–99)
Glucose-Capillary: 113 mg/dL — ABNORMAL HIGH (ref 70–99)
Glucose-Capillary: 117 mg/dL — ABNORMAL HIGH (ref 70–99)

## 2018-05-24 MED ORDER — PREGABALIN 50 MG PO CAPS: 75.0000 mg | ORAL_CAPSULE | Freq: Two times a day (BID) | ORAL | Status: DC

## 2018-05-24 MED ORDER — LORAZEPAM 2 MG/ML IJ SOLN
0.5000 mg | INTRAMUSCULAR | Status: DC | PRN
  Administered 2018-05-24: 17:00:00 0.5 mg via INTRAVENOUS
  Filled 2018-05-24: qty 1

## 2018-05-24 MED ORDER — ACETAMINOPHEN 325 MG PO TABS: 650.0000 mg | ORAL_TABLET | Freq: Four times a day (QID) | ORAL | Status: AC | PRN

## 2018-05-24 MED ORDER — ALPRAZOLAM 0.5 MG PO TABS: 0.5000 mg | ORAL_TABLET | Freq: Three times a day (TID) | ORAL | 0 refills | Status: AC | PRN

## 2018-05-24 MED ORDER — NICOTINE 14 MG/24HR TD PT24: 14.0000 mg | MEDICATED_PATCH | Freq: Every day | TRANSDERMAL | 0 refills | Status: AC

## 2018-05-24 MED ORDER — LACTULOSE 10 GM/15ML PO SOLN: 20.0000 g | Freq: Every day | ORAL | 0 refills | Status: AC

## 2018-05-24 NOTE — Progress Notes (Signed)
Midline removed, report called to Robin at [facility] to transport patient at 1730.  Discharge info to be given to [facility].

## 2018-05-24 NOTE — Social Work (Addendum)
Clinical Social Worker facilitated patient discharge including contacting patient family and facility to confirm patient discharge plans.  Clinical information faxed to facility and family agreeable with plan.  CSW arranged ambulance transport via [facility] to [place]- pick up at 5:30pm. RN to call [phone]  with report prior to discharge.  Clinical Social Worker will sign off for now as social work intervention is no longer needed. Please consult [place] again if new need arises.  Westley Hummer, MSW, [place] Social Worker [phone]

## 2018-05-24 NOTE — Consult Note (Signed)
   [facility] CM Inpatient Consult   05/24/2018  [name] Feb 15, 1945 [id number]    Follow-up note:  Patient being followed for disposition and ongoing needs but noted on Palliative Care team note, that patient's niece [name]) felt that Hospice will have more compassion for patient and feels strongly that he will be better cared for at [facility]. Per transition of care SW note, patient's niece requested referral to [facility]) in which patient will be discharge today.  There are no [facility] care management needs identified at this point.  Will sign off.  For questions, please contact:  Edwena Felty A. Kellyann Ordway, BSN, RN-BC [facility] Liaison Cell: [phone]

## 2018-05-24 NOTE — TOC Transition Note (Signed)
Transition of Care [facility]) - CM/SW Discharge Note   Patient Details  Name: [name] MRN: [id number] Date of Birth: March 12, 1945  [facility]) CM/SW Contact:  [name], [place] Phone Number: 05/24/2018, 11:56 AM   Clinical Narrative:    CSW spoke with Cassandra at [facility], they have a bed available at the hospice home in [place] today.   Informed RN and await dc summary. DNR is already signed on chart.   Final next level of care: [place] Barriers to Discharge: Barriers Resolved   Patient Goals and CMS Choice Patient states their goals for this hospitalization and ongoing recovery are:: Patient was unable to speak to CSW due to TBI. CMS Medicare.gov Compare Post Acute Care list provided to:: (pt niece) Choice offered to / list presented to : (pt niece)  Discharge Placement              Patient chooses bed at: Other - please specify in the comment section below: Patient to be transferred to facility by: [place] Name of family member notified: pt niece Patient and family notified of of transfer: 05/24/18  Discharge Plan and Services                                     Social Determinants of Health (SDOH) Interventions     Readmission Risk Interventions No flowsheet data found.

## 2018-05-24 NOTE — Progress Notes (Signed)
Daily Progress Note   Patient Name: [name]       Date: 05/24/2018 DOB: 09-Sep-1945  Age: 73 y.o. MRN#: [id number] Attending Physician: Domenic Polite, MD Primary Care Physician: Celene Squibb, MD Admit Date: 05/04/2018  Reason for Consultation/Follow-up: Establishing goals of care and Psychosocial/spiritual support  Subjective: Patient awake, alert, speaking.  Approximately 20% of what he says makes sense.  He wants to walk out of here and get a Burbon and a cigarette.  He states he has a lot of things to sort out.  He does not want to go to SNF.  He does not want to go to [facility]. Unfortunately he is not eating enough to sustain himself.  [name] and I held a face time call with [name].  [name] is struggling with making decisions for [name].  [name] and [name] are both adamant about no feeding tube ever.    [name] and I talked 1 on 1 after talking with [name].  [name] feel that Hospice will have more compassion for [name].  She will be able to visit him 3x a day at hospice.  She feels strongly that he will be better cared for at [facility].  He is not eating.  She understands that he will not survive if he does not eat.  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   Length of Stay: 20  Current Medications: Scheduled Meds:  . sodium chloride   Intravenous Once  . bacitracin   Topical BID  . chlorhexidine  15 mL Mouth Rinse BID  . free water  300 mL Per Tube Q8H  . Gerhardt's butt cream   Topical TID  . insulin aspart  0-15 Units Subcutaneous Q4H  . lactulose  20 g Oral BID  . mouth rinse  15 mL Mouth Rinse q12n4p  . nicotine  14 mg Transdermal Daily  . pregabalin  75 mg Oral BID    Continuous Infusions:   PRN Meds:  acetaminophen **OR** acetaminophen, bisacodyl, calcium carbonate, hydrALAZINE, insulin aspart, loperamide, ondansetron **OR** ondansetron (ZOFRAN) IV, Resource ThickenUp Clear, sodium chloride flush, sodium phosphate  Physical Exam        Thin, frail, gray in color, hard collar in place Cachectic  Confused.   Vital Signs: BP 136/71   Pulse  75   Temp 98.2 F (36.8 C)   Resp (!) 22   Ht 6' 0.52" (1.842 m)   Wt 79.5 kg   SpO2 99%   BMI 23.43 kg/m  SpO2: SpO2: 99 % O2 Device: O2 Device: Room Air O2 Flow Rate: O2 Flow Rate (L/min): 2 L/min  Intake/output summary:   Intake/Output Summary (Last 24 hours) at 05/24/2018 1551 Last data filed at 05/24/2018 1343 Gross per 24 hour  Intake 200 ml  Output 850 ml  Net -650 ml   LBM: Last BM Date: 05/23/18 Baseline Weight: Weight: 88.2 kg Most recent weight: Weight: 79.5 kg       Palliative Assessment/Data: 20%     Patient Active Problem List   Diagnosis Date Noted  . Closed displaced fracture of second cervical vertebra ([place])   . Dysphagia   . Palliative care by specialist   . Goals of care, counseling/discussion   . Alcoholic intoxication with complication ([place])   . Thrombocytopenia ([place])   . SAH (subarachnoid hemorrhage) ([place])   . Agitation   . Respiratory distress 05/08/2018  . Alcohol abuse 05/08/2018  . Subdural hematoma ([place]) 05/04/2018  . Prostate cancer ([place]) 04/16/2017  . Malignant neoplasm of prostate ([place]) 01/07/2017  . Hepatic cirrhosis ([place]) 08/17/2013  . Diverticulosis 07/19/2013  . DIARRHEA 08/22/2008  . COLITIS 08/21/2008  . DIVERTICULITIS, COLON 08/21/2008  . Diabetes mellitus type 2 in nonobese ([place]) 08/20/2008  . HYPERLIPIDEMIA 08/20/2008  . SMOKER 08/20/2008  . Essential hypertension 08/20/2008  . EXTERNAL HEMORRHOIDS 08/20/2008  . COLONIC POLYPS, ADENOMATOUS, HX OF 08/20/2008    Palliative Care Plan    Recommendations/Plan:  DC to hospice house  Will restart benzodiazepine prn  Comfort  measures only.  Goals of Care and Additional Recommendations:  Limitations on Scope of Treatment: Full Comfort Care  Code Status:  DNR  Prognosis:   approximately 2 weeks as he is not eating/drinking enough to sustain life.  He is bedbound with c2 fracture and remains confused.   Discharge Planning:  Hospice facility  Care plan was discussed with patient, daughter, [facility] MD, bedside RN Lenna Sciara  Thank you for allowing the Palliative Medicine Team to assist in the care of this patient.  Total time spent:  60 min.     Greater than 50%  of this time was spent counseling and coordinating care related to the above assessment and plan.  Florentina Jenny, PA-C Palliative Medicine  Please contact Palliative MedicineTeam phone at [phone] for questions and concerns between 7 am - 7 pm.   Please see AMION for individual provider pager numbers.

## 2018-05-24 NOTE — Discharge Summary (Signed)
Physician Discharge Summary  [name]:[id number] DOB: 09/10/1945 DOA: 05/04/2018  PCP: Celene Squibb, MD  Admit date: 05/04/2018 Discharge date: 05/24/2018  Time spent: 35 minutes  Recommendations for Outpatient Follow-up:  1. [facility] [place]   Discharge Diagnoses:  Principal Problem:   Subdural hematoma (HCC) Intracranial hemorrhage Alcohol withdrawal Alcoholic liver cirrhosis Adult failure to thrive Severe protein calorie malnutrition Prolonged ICU delirium   Diabetes mellitus type 2 in nonobese [facility])   Essential hypertension   Hepatic cirrhosis (HCC)   Prostate cancer ([place])   Respiratory distress   Alcohol abuse   Palliative care by specialist   Goals of care, counseling/discussion   Alcoholic intoxication with complication ([place])   Thrombocytopenia ([place])   SAH (subarachnoid hemorrhage) ([place])   Agitation   Dysphagia   Closed displaced fracture of second cervical vertebra [facility])   Discharge Condition: Poor  Diet recommendation: Comfort feeds  Filed Weights   05/17/18 0500 05/18/18 0500 05/24/18 0500  Weight: 84.3 kg 80.7 kg 79.5 kg    History of present illness:  73 year old male with history of metastatic prostate cancer, Liver cirrhosis, diabetes mellitus, hypertension, alcohol abuse, tobacco abuse was admitted to neurosurgery on 4/29 with right intraparenchymal hematoma, subdural hematoma and a C2 fracture.  Hospital Course:   Right intraparenchymal hematoma, extensive right subdural hematoma and  intraventricular hemorrhage with traumatic C2 fracture -Following a mechanical fall in an inebriated state -has chronic thrombocytopenia from liver cirrhosis -Admitted by neurosurgery, nonoperative management recommended  -Remains in c-collar  -Repeat CT head from 5/11 overall stable and improving, slight increase in right extra-axial hematoma noted  Encephalopathy/acute alcohol withdrawal and prolonged delirium -Alcohol level was  markedly elevated on admission at 212 -On day 2 of hospitalization patient was noted to have increased agitation and confusion consistent with timeline for acute withdrawal -Treated for alcohol withdrawal with high doses of Ativan, however continued to remain encephalopathic, CT head is stable, after 8 days was weaned off Ativan -Subsequently noted to have prolonged ICU delirium -A cortrak tube was placed and he was started on tube feeds through an NG tube -Maintained on thiamine, he was started on low-dose Seroquel last week however we subsequently stopped his Seroquel on 5/15 -Given mild improvement in mentation he underwent MBS 5/14 and was started on a dysphagia 1 diet with honey thick liquids  -Palliative medicine was consulted for goals of care, patient has had mild improvement in mental status and cognition however oral intake remains very poor at this time -Further conversations held with palliative medicine and niece [name] who is his only involved family member, now plan for hospice home in [place]  Fluid overload -Likely secondary to liver cirrhosis with hypoalbuminemia and third spacing -Chest x-ray and CTA chest5/3 wasconsistent with pulmonary edema and pleural effusions -COVID PCR is negative -Off IV fluids, given Lasix 5/3 and 5/4 -Appears euvolemic now, monitor off diuretics  Liver Cirrhosis -Suspect alcoholic liver disease given long history of alcoholism -Chronic thrombocytopenia associated with liver disease -Given IV Lasix earlier this admission, now euvolemic, monitor  Heavy tobacco abuse/suspected COPD -Per niece smoked 2 packs/day for 50 years,i.e. 100 pack year -Likely has underlying COPD from this -Respiratory status overall stable  DM -Holding Metformin -Moderate-scale SSI  HTN -On Lisinopril, discontinued  MetastaticProstate CA -s/p seed implants -Recent PSA reportedly normal  Code Status:DNR   Consultants:   Neuro surgery. Dr  Kathyrn Sheriff  Palliative care   Discharge Exam: Vitals:   05/24/18 1151 05/24/18 1152  BP:  136/71  Pulse: 84 75  Resp: (!) 22 (!) 22  Temp:  98.2 F (36.8 C)  SpO2: 99% 99%    General: Awake alert, oriented to self only Cardiovascular: S1-S2/regular rate rhythm Respiratory: Clear bilaterally  Discharge Instructions    Allergies as of 05/24/2018      Reactions   Metronidazole Nausea And Vomiting   REACTION: causes nausea and vomiting      Medication List    STOP taking these medications   Aleve 220 MG tablet Generic drug:  naproxen sodium   alfuzosin 10 MG 24 hr tablet Commonly known as:  UROXATRAL   calcium carbonate 500 MG chewable tablet Commonly known as:  TUMS - dosed in mg elemental calcium   cetirizine 10 MG tablet Commonly known as:  ZYRTEC   fenofibrate 160 MG tablet   FLUoxetine 40 MG capsule Commonly known as:  PROZAC   GEMFIBROZIL PO   lisinopril 20 MG tablet Commonly known as:  ZESTRIL   Lyrica 150 MG capsule Generic drug:  pregabalin   metformin 1000 MG (OSM) 24 hr tablet Commonly known as:  FORTAMET   pravastatin 20 MG tablet Commonly known as:  PRAVACHOL     TAKE these medications   acetaminophen 325 MG tablet Commonly known as:  TYLENOL Take 2 tablets (650 mg total) by mouth every 6 (six) hours as needed for mild pain (or Fever >/= 101). What changed:    medication strength  how much to take  reasons to take this   ALPRAZolam 0.5 MG tablet Commonly known as:  XANAX Take 1 tablet (0.5 mg total) by mouth 3 (three) times daily as needed for anxiety.   lactulose 10 GM/15ML solution Commonly known as:  CHRONULAC Take 30 mLs (20 g total) by mouth daily.   loratadine 10 MG tablet Commonly known as:  CLARITIN Take 10 mg by mouth daily as needed for allergies.   nicotine 14 mg/24hr patch Commonly known as:  NICODERM CQ - dosed in mg/24 hours Place 1 patch (14 mg total) onto the skin daily. Start taking on:  May 25, 2018    zolpidem 10 MG tablet Commonly known as:  AMBIEN Take 10 mg by mouth at bedtime as needed for sleep.      Allergies  Allergen Reactions  . Metronidazole Nausea And Vomiting    REACTION: causes nausea and vomiting      The results of significant diagnostics from this hospitalization (including imaging, microbiology, ancillary and laboratory) are listed below for reference.    Significant Diagnostic Studies: Ct Head Wo Contrast  Result Date: 05/16/2018 CLINICAL DATA:  Continued surveillance closed head injury. EXAM: CT HEAD WITHOUT CONTRAST TECHNIQUE: Contiguous axial images were obtained from the base of the skull through the vertex without intravenous contrast. COMPARISON:  CT head 05/09/2018. FINDINGS: Brain: Resolving intra cerebral hemorrhage affecting the RIGHT temporal lobe, with surrounding edema. Intraventricular blood is diminishing. No significant midline shift. Small LEFT-sided contusions/SAH are improved. There is an extra-axial hematoma on the RIGHT, low attenuation, primarily frontal, slightly increased, measuring 8 mm thickness on series 5, image 22. Continued surveillance warranted. Vascular: Calcification of the cavernous internal carotid arteries consistent with cerebrovascular atherosclerotic disease. No signs of intracranial large vessel occlusion. Skull: Calvarium intact. Sinuses/Orbits: Opacification of the sphenoid and LEFT maxillary sinuses. Other: None. IMPRESSION: 1. Resolving intracerebral hemorrhage affecting the RIGHT temporal lobe. 2. Resolving LEFT-sided contusions/SDH. 3. Slight increase in RIGHT frontal extra-axial hematoma, predominantly low attenuation, 8 mm thickness. Continued surveillance warranted. Electronically  Signed   By: Staci Righter M.D.   On: 05/16/2018 08:18   Ct Head Wo Contrast  Result Date: 05/09/2018 CLINICAL DATA:  Intracranial hemorrhage, follow-up EXAM: CT HEAD WITHOUT CONTRAST TECHNIQUE: Contiguous axial images were obtained from the base  of the skull through the vertex without intravenous contrast. COMPARISON:  Multiple priors, 05/04/2018 and 05/05/2018 FINDINGS: Brain: Modest increase in size of RIGHT temporal intraparenchymal hemorrhage, now 26 x 54 mm, with mild surrounding edema, but concomitant slight decrease in acute RIGHT subdural/epidural extra-axial hematoma. No significant midline shift. Mild layering intraventricular blood. Mild subarachnoid blood, slowly improving. Small cortical shear injuries, LEFT frontal and LEFT posterior frontal gyri, are stable to slightly improved. Slight improvement in interhemispheric and tentorial subdural. Vascular: Calcification of the cavernous internal carotid arteries consistent with cerebrovascular atherosclerotic disease. No signs of intracranial large vessel occlusion. Skull: Calvarium intact. Sinuses/Orbits: Mucosal thickening in the sphenoid. Some layering fluid in the LEFT division. Chronic LEFT maxillary sinus opacity with chronic tripod fracture, only partially healed. Negative orbits. Other: None. IMPRESSION: 1. Only slight increase in size of RIGHT temporal intraparenchymal hemorrhage, 26 x 54 mm, with mild surrounding edema. No significant midline shift. 2. Slight improvement in extra-axial hematomas. 3. Stable to slightly improved LEFT frontal and LEFT posterior frontal shear injuries. Electronically Signed   By: Staci Righter M.D.   On: 05/09/2018 16:05   Ct Head Wo Contrast  Result Date: 05/05/2018 CLINICAL DATA:  Follow-up post traumatic findings with right subdural hematoma and temporal contusion. EXAM: CT HEAD WITHOUT CONTRAST TECHNIQUE: Contiguous axial images were obtained from the base of the skull through the vertex without intravenous contrast. COMPARISON:  05/04/2018.  Older studies as distant as 11/09/2017. FINDINGS: Brain: Hemorrhagic contusion in the right temporal lobe continues to slowly bloom, measuring 5 x 3.3 x 2.2 cm today compared with 4.4 x 2.7 x 1.9 cm yesterday.  Surrounding edema is slightly more pronounced. Subdural hematoma along the convexity on the right is diminishing slightly, maximal thickness 9 mm. No additional or worsening subdural bleeding on the right diminishing mass effect with right-to-left midline shift reduced to 3 mm from 4 mm yesterday. Slight increase in ventricular size presumed to be secondary to reduced mass effect. The ventricles do not appear dilated over baseline study in November of last year. There is a small amount of blood dependent within the ventricular system. Post-traumatic subarachnoid hemorrhage on the right is becoming less dense. Small amount of blood along the falx is not increased. Hemorrhage along the septum pellucidum is not increased. Left parietal convexity subdural hematoma is stable at 9 mm. Hemorrhagic contusions at the left frontoparietal vertex are 1 or 2 mm larger, 7 mm at the more anterior focus and 10 mm at the more posterior focus. Small amount of localized subarachnoid blood. Vascular: There is atherosclerotic calcification of the major vessels at the base of the brain. Skull: No skull fracture identified. Sinuses/Orbits: Old left tripod fracture with opacification of the left maxillary sinus as seen previously which was acute in April of 2007. Chronic appearing inflammatory changes of the sphenoid sinus. Chronic right mastoid effusion. Other: None IMPRESSION: Worsening findings: Some increased blooming of the right temporal intraparenchymal hematoma, measuring 5 x 3.3 x 2.2 cm today compared with 4.4 x 2.7 x 1.9 cm on the immediate previous film. Slight increase in size of the left frontoparietal vertex contusions, increased by a mm or 2, measuring 7 and 10 mm presently. Improved or stable findings: Extensive right subdural hematoma is getting  smaller. Less mass effect with right-to-left shift reduced from 4 mm to 3 mm. Increase in ventricular size, probably secondary to diminished mass effect rather than developing  hydrocephalus, though this should be observed. Small amount of dependent intraventricular blood as expected. Diminishing conspicuity of subarachnoid blood. Stable left parietal subdural hematoma maximal thickness 8 mm. Electronically Signed   By: Nelson Chimes M.D.   On: 05/05/2018 14:01   Ct Head Wo Contrast  Result Date: 05/04/2018 CLINICAL DATA:  Intracranial hemorrhage follow-up. EXAM: CT HEAD WITHOUT CONTRAST TECHNIQUE: Contiguous axial images were obtained from the base of the skull through the vertex without intravenous contrast. COMPARISON:  CT head from same day at 3:42 a.m. FINDINGS: Brain: New left cerebral convexity acute subdural hematoma overlying the left parietal and temporal lobes, measuring up to 9 mm in maximal thickness. Mixed density acute on chronic right cerebral convexity subdural hematoma measuring up to 10 mm has slightly decreased in size, previously 13 mm. Unchanged small volume subdural blood extending along the anterior and posterior falx. Increased intraparenchymal and subarachnoid hemorrhage within the right temporal lobe. Unchanged trace subarachnoid hemorrhage in the prepontine cistern. Unchanged trace hemorrhage along the septum pellucidum. New small amount of layering intraventricular hemorrhage in the left occipital horn. Unchanged small intraparenchymal hemorrhages in the left superior frontal gyrus at the vertex. Decreased right to left midline shift, now measuring 4 mm, previously 8 mm. No hydrocephalus or downward herniation. The basilar cisterns remain patent. No evidence of acute infarction or mass lesion. Vascular: Calcified atherosclerosis at the skullbase. No hyperdense vessel. Skull: Normal. Negative for fracture or focal lesion. Sinuses/Orbits: Unchanged complete opacification of the left maxillary sinus. Unchanged partial opacification of the left sphenoid sinus. Unchanged mucosal thickening of the remaining paranasal sinuses. Unchanged partial opacification of the  right mastoid air cells. The left mastoid air cells are clear. Other: None. IMPRESSION: 1. Increasing intraparenchymal and subarachnoid hemorrhage within the right temporal lobe. 2. New left cerebral convexity acute subdural hematoma overlying the left parietal and temporal lobes, measuring up to 9 mm in maximal thickness. 3. Slightly decreased acute on chronic right cerebral convexity subdural hematoma measuring up to 10 mm in maximal thickness, previously 13 mm. Improved right to left midline shift, now 4 mm. 4. New small amount of layering intraventricular hemorrhage in the left occipital horn. Unchanged trace hemorrhage along the septum loosening. 5. Unchanged trace subarachnoid hemorrhage in the prepontine cistern and small intraparenchymal hemorrhages in the left superior frontal gyrus at the vertex. 6. No hydrocephalus or downward herniation. Electronically Signed   By: Titus Dubin M.D.   On: 05/04/2018 14:09   Ct Head Wo Contrast  Result Date: 05/04/2018 CLINICAL DATA:  Initial evaluation for acute trauma, fall. EXAM: CT HEAD WITHOUT CONTRAST CT MAXILLOFACIAL WITHOUT CONTRAST CT CERVICAL SPINE WITHOUT CONTRAST TECHNIQUE: Multidetector CT imaging of the head, cervical spine, and maxillofacial structures were performed using the standard protocol without intravenous contrast. Multiplanar CT image reconstructions of the cervical spine and maxillofacial structures were also generated. COMPARISON:  Prior CT from 11/09/2017. FINDINGS: CT HEAD FINDINGS Brain: Generalized age-related cerebral atrophy with chronic small vessel ischemic disease. Mixed density acute on chronic right holo hemispheric subdural hematoma overlies the right cerebral convexity. This measures up to 13 mm in maximal thickness. Associated mass effect on the subjacent right cerebral hemisphere with up to 8 mm of right-to-left shift. Small volume subdural blood extends along the anterior and posterior falx and around the right temporal  pole. Associated scattered posttraumatic subarachnoid  hemorrhage with possible contusion within the underlying right temporal lobe. Associated small volume subarachnoid within the prepontine cistern. Trace hemorrhage seen along the septum pellucidum. Few small cortical contusions measuring up to 8 mm present at the high left frontal lobe near the vertex (series 3, image 33). Partial effacement of the right lateral ventricle without hydrocephalus or ventricular trapping. Basilar cisterns remain patent. Underlying atrophy with chronic microvascular ischemic disease. No acute large vessel territory infarct. No mass lesion. Vascular: No hyperdense vessel. Calcified atherosclerosis present at the skull base. Skull: Left parietal scalp contusion.  No calvarial fracture. Other: Right mastoid effusion, likely chronic. No temporal bone fracture. CT MAXILLOFACIAL FINDINGS Osseous: Chronic left-sided tripod fractures involving the left side of attic arch, lateral left orbit, left orbital floor, and left maxilla noted. No definite acute component. Scattered foci of gas within the left pterygoid palatine fossa favored to be related to communication with the sinuses rather than acute fracture. Pterygoid plates intact. Maxilla otherwise intact. No acute nasal bone fracture. Right-to-left nasal septal deviation without fracture. No acute mandibular fracture. Mandibular condyles normally situated. Degenerative changes noted about the right TMJ. Patient is edentulous. Orbits: Globes and orbital soft tissues within normal limits. No retro-orbital hematoma. Patient status post bilateral ocular lens replacement. Sinuses: Left maxillary sinus opacified. Central hyperdensity favored to reflect desiccated secretions and/or superimposed fungal elements. Chronic left sphenoid sinusitis noted as well. Additional mild scattered mucosal thickening throughout the remainder of the paranasal sinuses. No definite hemosinus. Soft tissues: No  appreciable soft tissue injury about the face. CT CERVICAL SPINE FINDINGS Alignment: Physiologic. Skull base and vertebrae: Skull base intact. Normal C1-2 articulations are preserved. Fracture lucencies extend through the left lateral mass of C2, somewhat age indeterminate, but could be acute (series 21, image 15). Involvement of the left transverse foramen. Vertebral body heights maintained. No other acute fracture. Soft tissues and spinal canal: Soft tissues of the neck demonstrate no acute finding. No abnormal prevertebral edema. Spinal canal demonstrates no acute finding. Prominent vascular calcifications about the carotid bifurcations. Disc levels: Prominent anterior bulky osteophytic spurring noted at C4-5 through C7-T1. Multilevel facet arthrosis, most notable at C3-4 and C4-5 on the left. Central disc protrusions at C3-4 through C5-6 with up to moderate spinal stenosis. Upper chest: Visualized upper chest demonstrates no acute finding. Mild paraseptal emphysema at the lung apices. Other: None. IMPRESSION: CT HEAD: 1. Large mixed density right holo hemispheric subdural hematoma measuring up to 13 mm in maximal diameter. Secondary mass effect on the right cerebral hemisphere with up to 8 mm right-to-left midline shift. No hydrocephalus or ventricular trapping. 2. Associated posttraumatic subarachnoid hemorrhage and/or contusion within the underlying right temporal lobe, with small volume subarachnoid within the basilar cisterns. 3. Few small cortical contusions at the high left frontal lobe near the vertex. 4. Left parietal scalp contusion.  No calvarial fracture. CT MAXILLOFACIAL: 1. No acute maxillofacial injury identified. 2. Chronic left facial tripod fracture as above. 3. Chronic left maxillary and sphenoid sinusitis. CT CERVICAL SPINE: 1. Fracture lucencies extending through the left lateral mass of C2, age indeterminate, and could be chronic in nature, although an acute component not entirely excluded.  Correlation with physical exam recommended. Additionally, further assessment with dedicated MRI to evaluate for associated marrow edema could be performed for further evaluation. Please note that this fracture does involve the left C2 transverse foramen. Further assessment with dedicated CTA suggested to evaluate for possible vascular injury. 2. No other acute traumatic injury within the cervical spine. 3.  Central disc protrusions at C3-4 through C5-6 with resultant mild to moderate spinal stenosis. Critical Value/emergent results were called by telephone at the time of interpretation on 05/04/2018 at 4:51 am to Dr. Ripley Fraise , who verbally acknowledged these results. Electronically Signed   By: Jeannine Boga M.D.   On: 05/04/2018 04:54   Ct Angio Neck W Or Wo Contrast  Result Date: 05/04/2018 CLINICAL DATA:  Subdural and subarachnoid hemorrhage after fall. CT lateral mass fracture. Fracture anterior to the left transverse foramen EXAM: CT ANGIOGRAPHY NECK TECHNIQUE: Multidetector CT imaging of the neck was performed using the standard protocol during bolus administration of intravenous contrast. Multiplanar CT image reconstructions and MIPs were obtained to evaluate the vascular anatomy. Carotid stenosis measurements (when applicable) are obtained utilizing NASCET criteria, using the distal internal carotid diameter as the denominator. CONTRAST:  56mL OMNIPAQUE IOHEXOL 350 MG/ML SOLN COMPARISON:  CT of the face and cervical spine 05/04/2018 FINDINGS: Aortic arch: There is a common origin of the left common carotid artery and innominate artery. Atherosclerotic calcifications are present at the arch without aneurysm or stenosis. Right carotid system: The right common carotid artery demonstrates minimal calcification. There is no significant stenosis proximal to the bifurcation. Atherosclerotic calcifications are present in the distal right common carotid artery and at the bifurcation without a  significant stenosis relative to the more distal vessels. Cervical right ICA is tortuous. No significant stenosis or vascular injury is present. Left carotid system: The left common carotid artery demonstrates dense calcifications laterally and distally. Additional calcifications are present at the left carotid bifurcation. There is no significant stenosis relative to the more distal vessel. The cervical left ICA is tortuous without focal stenosis. Vertebral arteries: Vertebral arteries originate from the subclavian arteries bilaterally without significant stenosis. There is mild tortuosity of the V1 segments bilaterally. The right vertebral artery is the dominant vessel. Vertebral arteries are within normal limits throughout the neck bilaterally. There is no focal stenosis or injury to the left vertebral artery adjacent to the left C2 fracture. Calcifications are present in the left vertebral artery at the dural margin. Vertebrobasilar junction is normal. PICA origins are visualized and normal. Prominent AICA vessels are noted bilaterally. Skeleton: Left C2 transverse process fracture is again noted. No new fractures are present. Left greater than right facet degenerative changes are noted. No focal lytic or blastic lesions are present. The patient is edentulous. Other neck: The soft tissues the neck are unremarkable. Salivary glands are normal. No significant adenopathy is present. Dominant nodule in the left lobe of the thyroid it measures 9 mm. No further workup is necessary. Next significant cervical adenopathy is present. Upper chest: Lung apices are clear. Centrilobular emphysematous changes are noted. Esophagus is dilated. IMPRESSION: 1. No acute abnormality to the vertebral artery on either side. 2. Nondisplaced left C2 fracture without associated vascular abnormality. 3. Atherosclerotic disease involving the aortic arch, distal common carotid arteries, bilateral carotid bifurcations without focal stenosis.  4. Tortuosity of the cervical internal carotid arteries bilaterally without significant stenosis. This is likely related to chronic hypertension. 5.  Emphysema (ICD10-J43.9). 6. Dilated esophagus. This likely represents esophageal dysmotility. No mass lesion is evident. Electronically Signed   By: San Morelle M.D.   On: 05/04/2018 06:20   Ct Angio Chest Pe W Or Wo Contrast  Result Date: 05/08/2018 CLINICAL DATA:  Tachypnea, labored breathing. EXAM: CT ANGIOGRAPHY CHEST WITH CONTRAST TECHNIQUE: Multidetector CT imaging of the chest was performed using the standard protocol during bolus administration of  intravenous contrast. Multiplanar CT image reconstructions and MIPs were obtained to evaluate the vascular anatomy. CONTRAST:  157mL OMNIPAQUE IOHEXOL 350 MG/ML SOLN COMPARISON:  Chest x-ray 05/07/2018 FINDINGS: Cardiovascular: Satisfactory opacification of the pulmonary arteries to the segmental level. No evidence of pulmonary embolism. Stable enlarged heart size. No pericardial effusion. Thoracic aortic atherosclerosis. No thoracic aortic dissection. Coronary artery atherosclerosis. Mediastinum/Nodes: No enlarged mediastinal, hilar, or axillary lymph nodes. Thyroid gland, trachea, and esophagus demonstrate no significant findings. Lungs/Pleura: Bilateral interstitial thickening. Patchy areas of airspace disease with ground-glass opacity in the right upper lobe. small bilateral pleural effusions. Bibasilar atelectasis. Upper Abdomen: No acute abnormality. Musculoskeletal: No chest wall abnormality. No acute or significant osseous findings. Review of the MIP images confirms the above findings. IMPRESSION: 1. No evidence of pulmonary embolus. 2. Findings concerning for mild CHF. 3. Patchy areas of airspace disease with ground-glass opacity in the right upper lobe which may reflect more severe asymmetric pulmonary edema versus less likely secondary to an infectious or inflammatory etiology. 4.  Aortic  Atherosclerosis (ICD10-I70.0). Electronically Signed   By: Kathreen Devoid   On: 05/08/2018 14:53   Ct Cervical Spine Wo Contrast  Result Date: 05/04/2018 CLINICAL DATA:  Initial evaluation for acute trauma, fall. EXAM: CT HEAD WITHOUT CONTRAST CT MAXILLOFACIAL WITHOUT CONTRAST CT CERVICAL SPINE WITHOUT CONTRAST TECHNIQUE: Multidetector CT imaging of the head, cervical spine, and maxillofacial structures were performed using the standard protocol without intravenous contrast. Multiplanar CT image reconstructions of the cervical spine and maxillofacial structures were also generated. COMPARISON:  Prior CT from 11/09/2017. FINDINGS: CT HEAD FINDINGS Brain: Generalized age-related cerebral atrophy with chronic small vessel ischemic disease. Mixed density acute on chronic right holo hemispheric subdural hematoma overlies the right cerebral convexity. This measures up to 13 mm in maximal thickness. Associated mass effect on the subjacent right cerebral hemisphere with up to 8 mm of right-to-left shift. Small volume subdural blood extends along the anterior and posterior falx and around the right temporal pole. Associated scattered posttraumatic subarachnoid hemorrhage with possible contusion within the underlying right temporal lobe. Associated small volume subarachnoid within the prepontine cistern. Trace hemorrhage seen along the septum pellucidum. Few small cortical contusions measuring up to 8 mm present at the high left frontal lobe near the vertex (series 3, image 33). Partial effacement of the right lateral ventricle without hydrocephalus or ventricular trapping. Basilar cisterns remain patent. Underlying atrophy with chronic microvascular ischemic disease. No acute large vessel territory infarct. No mass lesion. Vascular: No hyperdense vessel. Calcified atherosclerosis present at the skull base. Skull: Left parietal scalp contusion.  No calvarial fracture. Other: Right mastoid effusion, likely chronic. No  temporal bone fracture. CT MAXILLOFACIAL FINDINGS Osseous: Chronic left-sided tripod fractures involving the left side of attic arch, lateral left orbit, left orbital floor, and left maxilla noted. No definite acute component. Scattered foci of gas within the left pterygoid palatine fossa favored to be related to communication with the sinuses rather than acute fracture. Pterygoid plates intact. Maxilla otherwise intact. No acute nasal bone fracture. Right-to-left nasal septal deviation without fracture. No acute mandibular fracture. Mandibular condyles normally situated. Degenerative changes noted about the right TMJ. Patient is edentulous. Orbits: Globes and orbital soft tissues within normal limits. No retro-orbital hematoma. Patient status post bilateral ocular lens replacement. Sinuses: Left maxillary sinus opacified. Central hyperdensity favored to reflect desiccated secretions and/or superimposed fungal elements. Chronic left sphenoid sinusitis noted as well. Additional mild scattered mucosal thickening throughout the remainder of the paranasal sinuses. No definite hemosinus. Soft  tissues: No appreciable soft tissue injury about the face. CT CERVICAL SPINE FINDINGS Alignment: Physiologic. Skull base and vertebrae: Skull base intact. Normal C1-2 articulations are preserved. Fracture lucencies extend through the left lateral mass of C2, somewhat age indeterminate, but could be acute (series 21, image 15). Involvement of the left transverse foramen. Vertebral body heights maintained. No other acute fracture. Soft tissues and spinal canal: Soft tissues of the neck demonstrate no acute finding. No abnormal prevertebral edema. Spinal canal demonstrates no acute finding. Prominent vascular calcifications about the carotid bifurcations. Disc levels: Prominent anterior bulky osteophytic spurring noted at C4-5 through C7-T1. Multilevel facet arthrosis, most notable at C3-4 and C4-5 on the left. Central disc protrusions  at C3-4 through C5-6 with up to moderate spinal stenosis. Upper chest: Visualized upper chest demonstrates no acute finding. Mild paraseptal emphysema at the lung apices. Other: None. IMPRESSION: CT HEAD: 1. Large mixed density right holo hemispheric subdural hematoma measuring up to 13 mm in maximal diameter. Secondary mass effect on the right cerebral hemisphere with up to 8 mm right-to-left midline shift. No hydrocephalus or ventricular trapping. 2. Associated posttraumatic subarachnoid hemorrhage and/or contusion within the underlying right temporal lobe, with small volume subarachnoid within the basilar cisterns. 3. Few small cortical contusions at the high left frontal lobe near the vertex. 4. Left parietal scalp contusion.  No calvarial fracture. CT MAXILLOFACIAL: 1. No acute maxillofacial injury identified. 2. Chronic left facial tripod fracture as above. 3. Chronic left maxillary and sphenoid sinusitis. CT CERVICAL SPINE: 1. Fracture lucencies extending through the left lateral mass of C2, age indeterminate, and could be chronic in nature, although an acute component not entirely excluded. Correlation with physical exam recommended. Additionally, further assessment with dedicated MRI to evaluate for associated marrow edema could be performed for further evaluation. Please note that this fracture does involve the left C2 transverse foramen. Further assessment with dedicated CTA suggested to evaluate for possible vascular injury. 2. No other acute traumatic injury within the cervical spine. 3. Central disc protrusions at C3-4 through C5-6 with resultant mild to moderate spinal stenosis. Critical Value/emergent results were called by telephone at the time of interpretation on 05/04/2018 at 4:51 am to Dr. Ripley Fraise , who verbally acknowledged these results. Electronically Signed   By: Jeannine Boga M.D.   On: 05/04/2018 04:54   Dg Chest Port 1 View  Result Date: 05/12/2018 CLINICAL DATA:   Shortness of breath EXAM: PORTABLE CHEST 1 VIEW COMPARISON:  Chest x-ray dated 05/07/2018 FINDINGS: There is persistent significant cardiomegaly. The patient's previously demonstrated pulmonary edema has improved somewhat since the prior study. There is no pneumothorax. The lung volumes are low. Small bilateral pleural effusions are again noted. Old healed right-sided rib fractures are again noted. There are old healed left-sided rib fractures. There is an old healed right-sided clavicle fracture. IMPRESSION: Cardiomegaly with improving pulmonary edema. Electronically Signed   By: Constance Holster M.D.   On: 05/12/2018 16:46   Dg Chest Port 1 View  Result Date: 05/07/2018 CLINICAL DATA:  Tachycardia. EXAM: PORTABLE CHEST 1 VIEW COMPARISON:  May 04, 2018 FINDINGS: Stable cardiomegaly. The hila and mediastinum are unremarkable. Haziness over the right chest is at least partially due to patient rotation. However, mild asymmetric edema is not excluded. IMPRESSION: Haziness over the right chest is at least partially due to patient rotation. However, mild asymmetric edema not excluded. A PA and lateral chest x-ray with less rotation could better evaluate. Electronically Signed   By: Dorise Bullion  III M.D   On: 05/07/2018 23:17   Dg Chest Port 1 View  Result Date: 05/04/2018 CLINICAL DATA:  Fall.  Intracranial hemorrhage.  Preop for surgery. EXAM: PORTABLE CHEST 1 VIEW COMPARISON:  Two-view chest x-ray 01/29/2017 FINDINGS: Heart size is exaggerated by low lung volumes. Chronic interstitial coarsening is present. There is no edema or effusion. Remote right-sided rib fractures are present. IMPRESSION: 1. No acute cardiopulmonary disease. 2. Stable chronic interstitial coarsening. 3. Remote right-sided rib fractures. Electronically Signed   By: San Morelle M.D.   On: 05/04/2018 05:16   Dg Swallowing Func-speech Pathology  Result Date: 05/19/2018 Objective Swallowing Evaluation: Type of Study:  MBS-Modified Barium Swallow Study  Patient Details Name: LAKSHYA MCGILLICUDDY MRN: [id number] Date of Birth: 07/21/1945 Today's Date: 05/19/2018 Time: SLP Start Time (ACUTE ONLY): 1331 -SLP Stop Time (ACUTE ONLY): 1343 SLP Time Calculation (min) (ACUTE ONLY): 12 min Past Medical History: Past Medical History: Diagnosis Date . Anxiety  . Benign localized prostatic hyperplasia with lower urinary tract symptoms (LUTS)  . Depression  . Diverticulosis of colon  . GERD (gastroesophageal reflux disease)  . History of colitis  . History of colonic diverticulitis   03-10-2006 s/p bowel resection  . History of deep vein thrombosis (DVT) of lower extremity 06/2006  bilateral femoral/   04-08-2017 per pt had several dvt's in 2008 . Hypertension  . Hypertriglyceridemia  . NAFLD (nonalcoholic fatty liver disease)  . OA (osteoarthritis)   knees . Peripheral neuropathy  . Prostate cancer [facility]) urologist-- dr Alyson Ingles  oncologist- dr Tammi Klippel  dx 12-02-2016-- Stage T1c, Gleason 3+4,  PSA 8.2, 37.8cc-- scheduled for radioactive seed implants 04-16-2017 . S/P insertion of IVC (inferior vena caval) filter 07/02/2006  due to bilateral fermoral dvt . Seasonal allergies  . Thrombocytopenia ([place])  . Type 2 diabetes mellitus ([place])   followed by pcp . Weak urinary stream  . Wears glasses  Past Surgical History: Past Surgical History: Procedure Laterality Date . CATARACT EXTRACTION W/ INTRAOCULAR LENS  IMPLANT, BILATERAL  2010 . CHOLECYSTECTOMY OPEN  1973  and APPENDECTOMY . COLONOSCOPY N/A 06/01/2012  Procedure: COLONOSCOPY;  Surgeon: Rogene Houston, MD;  [facility];  Service: Endoscopy;  Laterality: N/A;  930-moved to 1025 Ann to notify pt . IVC FILTER INSERTION  07/02/2006 . PROSTATE BIOPSY  12-02-2016   dr Alyson Ingles office . RADIOACTIVE SEED IMPLANT N/A 04/16/2017  Procedure: RADIOACTIVE SEED IMPLANT/BRACHYTHERAPY IMPLANT;  Surgeon: Cleon Gustin, MD;  Location: [facility];  Service: Urology;  Laterality: N/A;  . SIGMOID RESECTION W/ STAPLED SIDE TO SIDE ANASTOMOSIS/ DRAINAGE ABSCESS  03-10-2006  dr bradford [facility] . SPACE OAR INSTILLATION N/A 04/16/2017  Procedure: SPACE OAR INSTILLATION;  Surgeon: Cleon Gustin, MD;  Location: [facility];  Service: Urology;  Laterality: N/A; HPI: [name] is a 73 y.o. male with history of diabetes, GERD, hyperlipidemia, hypertension, prostate cancer, cirrhosis who was brought to ED after a fall. CT head/C spine which is significant for an acute right SDH and C2 fracture. Repeat CT 4/30 revealed worsening findings: Some increased blooming of the right temporal intraparenchymal hematoma, measuring 5 x 3.3 x 2.2 cm today compared with 4.4 x 2.7 x 1.9 cm on the immediate previous film. Slight increase in size of the left frontoparietal vertex contusions, increased by a mm or 2, measuring 7 and 10 mm presently. CXR No acute cardiopulmonary disease. MBS 05/06/18 with recommendation of puree and nectar. Susequently pt aspirated, made NPO. Pt appropriate for  repeat MBS today.  Subjective: pt in chair, had been fully awake with pt/ot at edge of bed 15 minutes prior Assessment / Plan / Recommendation CHL IP CLINICAL IMPRESSIONS 05/19/2018 Clinical Impression Pt was alert and exhibited mild oropharyngeal dysphagia with adequate airway protection during MBS. Decreased oral control and manipulation led to lingual residue, delayed propulsion and holding. Sensation and swallow initiation was timely with one instance of nectar barium reaching pyriform sinuses before swallow triggered. Question mildly decreased epiglottic deflection in combination of presence of Cortrak due to maximum vallecular residue with puree and mild-mod pyriform sinus. Initially honey thick yielded mod pharyngeal residue fading to mild-mod at end of study. Pt's cognition and alertness have waxed and waned significantly therefore pt must consume po's only when fully alert and full supervision with Dys 1,  honey thick, crush meds and check for oral pocketing.  SLP Visit Diagnosis Dysphagia, oropharyngeal phase (R13.12) Attention and concentration deficit following -- Frontal lobe and executive function deficit following -- Impact on safety and function Mild aspiration risk;Moderate aspiration risk   CHL IP TREATMENT RECOMMENDATION 05/19/2018 Treatment Recommendations Therapy as outlined in treatment plan below   Prognosis 05/19/2018 Prognosis for Safe Diet Advancement (No Data) Barriers to Reach Goals -- Barriers/Prognosis Comment -- CHL IP DIET RECOMMENDATION 05/19/2018 SLP Diet Recommendations Dysphagia 1 (Puree) solids;Honey thick liquids Liquid Administration via Cup;No straw Medication Administration Crushed with puree Compensations Slow rate;Small sips/bites;Lingual sweep for clearance of pocketing;Multiple dry swallows after each bite/sip Postural Changes Seated upright at 90 degrees   CHL IP OTHER RECOMMENDATIONS 05/19/2018 Recommended Consults -- Oral Care Recommendations Oral care BID Other Recommendations Order thickener from pharmacy   CHL IP FOLLOW UP RECOMMENDATIONS 05/19/2018 Follow up Recommendations Skilled Nursing facility   [facility] IP FREQUENCY AND DURATION 05/19/2018 Speech Therapy Frequency (ACUTE ONLY) min 2x/week Treatment Duration 2 weeks      CHL IP ORAL PHASE 05/19/2018 Oral Phase Impaired Oral - Pudding Teaspoon -- Oral - Pudding Cup -- Oral - Honey Teaspoon -- Oral - Honey Cup Holding of bolus;Delayed oral transit Oral - Nectar Teaspoon NT Oral - Nectar Cup Delayed oral transit;Holding of bolus;Lingual/palatal residue Oral - Nectar Straw NT Oral - Thin Teaspoon -- Oral - Thin Cup NT Oral - Thin Straw NT Oral - Puree Delayed oral transit;Holding of bolus Oral - Mech Soft NT Oral - Regular -- Oral - Multi-Consistency -- Oral - Pill -- Oral Phase - Comment --  CHL IP PHARYNGEAL PHASE 05/19/2018 Pharyngeal Phase Impaired Pharyngeal- Pudding Teaspoon -- Pharyngeal -- Pharyngeal- Pudding Cup -- Pharyngeal --  Pharyngeal- Honey Teaspoon -- Pharyngeal -- Pharyngeal- Honey Cup Pharyngeal residue - valleculae;Pharyngeal residue - pyriform;Reduced epiglottic inversion Pharyngeal -- Pharyngeal- Nectar Teaspoon NT Pharyngeal -- Pharyngeal- Nectar Cup Pharyngeal residue - valleculae;Pharyngeal residue - pyriform;Delayed swallow initiation-pyriform sinuses Pharyngeal -- Pharyngeal- Nectar Straw NT Pharyngeal -- Pharyngeal- Thin Teaspoon NT Pharyngeal -- Pharyngeal- Thin Cup NT Pharyngeal -- Pharyngeal- Thin Straw -- Pharyngeal -- Pharyngeal- Puree Pharyngeal residue - valleculae;Pharyngeal residue - pyriform;Reduced epiglottic inversion Pharyngeal -- Pharyngeal- Mechanical Soft NT Pharyngeal -- Pharyngeal- Regular -- Pharyngeal -- Pharyngeal- Multi-consistency -- Pharyngeal -- Pharyngeal- Pill -- Pharyngeal -- Pharyngeal Comment --  CHL IP CERVICAL ESOPHAGEAL PHASE 05/19/2018 Cervical Esophageal Phase WFL Pudding Teaspoon -- Pudding Cup -- Honey Teaspoon -- Honey Cup -- Nectar Teaspoon -- Nectar Cup -- Nectar Straw -- Thin Teaspoon -- Thin Cup -- Thin Straw -- Puree -- Mechanical Soft -- Regular -- Multi-consistency -- Pill -- Cervical Esophageal Comment -- [name]  05/19/2018, 3:12 PM  Orbie Pyo Litaker M.Ed Actor Pager [phone] Office [phone]             Dg Swallowing Func-speech Pathology  Result Date: 05/06/2018 Objective Swallowing Evaluation: Type of Study: MBS-Modified Barium Swallow Study  Patient Details Name: ARNEZ STONEKING MRN: [id number] Date of Birth: [birth year] Today's Date: 05/06/2018 Time: SLP Start Time (ACUTE ONLY): 1020 -SLP Stop Time (ACUTE ONLY): 1046 SLP Time Calculation (min) (ACUTE ONLY): 26 min Past Medical History: Past Medical History: Diagnosis Date . Anxiety  . Benign localized prostatic hyperplasia with lower urinary tract symptoms (LUTS)  . Depression  . Diverticulosis of colon  . GERD (gastroesophageal reflux disease)  . History of colitis  . History of  colonic diverticulitis   03-10-2006 s/p bowel resection  . History of deep vein thrombosis (DVT) of lower extremity 06/2006  bilateral femoral/   04-08-2017 per pt had several dvt's in 2008 . Hypertension  . Hypertriglyceridemia  . NAFLD (nonalcoholic fatty liver disease)  . OA (osteoarthritis)   knees . Peripheral neuropathy  . Prostate cancer [facility]) urologist-- dr Alyson Ingles  oncologist- dr Tammi Klippel  dx 12-02-2016-- Stage T1c, Gleason 3+4,  PSA 8.2, 37.8cc-- scheduled for radioactive seed implants 04-16-2017 . S/P insertion of IVC (inferior vena caval) filter 07/02/2006  due to bilateral fermoral dvt . Seasonal allergies  . Thrombocytopenia ([employer])  . Type 2 diabetes mellitus ([place])   followed by pcp . Weak urinary stream  . Wears glasses  Past Surgical History: Past Surgical History: Procedure Laterality Date . CATARACT EXTRACTION W/ INTRAOCULAR LENS  IMPLANT, BILATERAL  2010 . CHOLECYSTECTOMY OPEN  1973  and APPENDECTOMY . COLONOSCOPY N/A 06/01/2012  Procedure: COLONOSCOPY;  Surgeon: Rogene Houston, MD;  [facility];  Service: Endoscopy;  Laterality: N/A;  930-moved to 1025 Ann to notify pt . IVC FILTER INSERTION  07/02/2006 . PROSTATE BIOPSY  12-02-2016   dr Alyson Ingles office . RADIOACTIVE SEED IMPLANT N/A 04/16/2017  Procedure: RADIOACTIVE SEED IMPLANT/BRACHYTHERAPY IMPLANT;  Surgeon: Cleon Gustin, MD;  Location[facility];  Service: Urology;  Laterality: N/A; . SIGMOID RESECTION W/ STAPLED SIDE TO SIDE ANASTOMOSIS/ DRAINAGE ABSCESS  03-10-2006  dr bradford [facility] . SPACE OAR INSTILLATION N/A 04/16/2017  Procedure: SPACE OAR INSTILLATION;  Surgeon: Cleon Gustin, MD;  Location: [facility];  Service: Urology;  Laterality: N/A; HPI: [name] is a 73 y.o. male with history of diabetes, GERD, hyperlipidemia, hypertension, prostate cancer, cirrhosis who was brought to ED after a fall. He underwent CT head/C spine which is significant for an acute right SDH and  C2 fracture. Repeat CT 4/30 revealed worsening findings: Some increased blooming of the right temporal intraparenchymal hematoma, measuring 5 x 3.3 x 2.2 cm today compared with 4.4 x 2.7 x 1.9 cm on the immediate previous film. Slight increase in size of the left frontoparietal vertex contusions, increased by a mm or 2, measuring 7 and 10 mm presently. CXR No acute cardiopulmonary disease.  Pt underwent clinical swallow eval with recommendation for MBS and npo x ice chips/medicine with applesauce. RN reports pt is coughing with applesauce and is concerned for aspiration.  MBS indicated. Pt has not had surgery and is in a cervical collar.   Subjective: pt in chair, had been fully awake with pt/ot at edge of bed 15 minutes prior Assessment / Plan / Recommendation CHL IP CLINICAL IMPRESSIONS 05/06/2018 Clinical Impression Patient's mentation negatively impacted the accuracy of the study.  He  was fully alert 20 minutes prior sitting up in his bed with PT/OT however was sleepy during testing.  SlP proceeded with test as pt had been insisting on consuming water.  He currently presents with moderate oropharyngeal dysphagia due to sensorimotor deficits.  Decreased lingual coordination results in premature spillage with all boluses and delayed oral transiting/lingual pumping with puree.  He aspirated with thin prior to swallow as barium pooled at pyriform sinus and spilled into open airway.  Aspiration followed by weak nonproductive cough.  Pt with only mild resdiuals but residuals mix with secretions and are often penetrated and subsequently silented aspirated.  Verbal cues and dry spoon lingual stimulation did not elicit dry swallow, trace barium on spoon effective for swallow.  At this time due to pt's mentation, recommend he remain npo with oral care.  When he fully awakens would recommend to start a dys1/nectar diet with occasional oral suctioning before, during and after intake.  Recommend pt be allowed tsps of thin  water between meals only after oral care/suction for his comfort, no other intake with the water.  Will follow up for dysphagia management     MD please order cognitive linguistic evaluation if you agree.  Thanks.  SLP Visit Diagnosis Dysphagia, oropharyngeal phase (R13.12) Attention and concentration deficit following -- Frontal lobe and executive function deficit following -- Impact on safety and function Moderate aspiration risk;Risk for inadequate nutrition/hydration   CHL IP TREATMENT RECOMMENDATION 05/06/2018 Treatment Recommendations Therapy as outlined in treatment plan below   Prognosis 05/05/2018 Prognosis for Safe Diet Advancement Good Barriers to Reach Goals Medication Barriers/Prognosis Comment -- CHL IP DIET RECOMMENDATION 05/06/2018 SLP Diet Recommendations Dysphagia 1 (Puree) solids;Nectar thick liquid;Free water protocol after oral care Liquid Administration via Cup Medication Administration Whole meds with puree Compensations Slow rate;Small sips/bites;Other (Comment) Postural Changes Seated upright at 90 degrees;Remain semi-upright after after feeds/meals (Comment)   CHL IP OTHER RECOMMENDATIONS 05/06/2018 Recommended Consults -- Oral Care Recommendations Oral care before and after PO;Other (Comment) Other Recommendations Order thickener from pharmacy;Have oral suction available   CHL IP FOLLOW UP RECOMMENDATIONS 05/06/2018 Follow up Recommendations Other (comment)   CHL IP FREQUENCY AND DURATION 05/06/2018 Speech Therapy Frequency (ACUTE ONLY) min 2x/week Treatment Duration 2 weeks      CHL IP ORAL PHASE 05/06/2018 Oral Phase Impaired Oral - Pudding Teaspoon -- Oral - Pudding Cup -- Oral - Honey Teaspoon -- Oral - Honey Cup -- Oral - Nectar Teaspoon Decreased bolus cohesion;Weak lingual manipulation;Right anterior bolus loss Oral - Nectar Cup Decreased bolus cohesion;Premature spillage;Weak lingual manipulation Oral - Nectar Straw Premature spillage;Decreased bolus cohesion;Weak lingual manipulation;Lingual  pumping Oral - Thin Teaspoon -- Oral - Thin Cup Decreased bolus cohesion;Premature spillage;Weak lingual manipulation;Right anterior bolus loss Oral - Thin Straw Decreased bolus cohesion;Premature spillage;Weak lingual manipulation Oral - Puree Weak lingual manipulation;Delayed oral transit;Premature spillage;Lingual pumping Oral - Mech Soft Impaired mastication;Delayed oral transit;Weak lingual manipulation;Premature spillage;Lingual pumping Oral - Regular -- Oral - Multi-Consistency -- Oral - Pill -- Oral Phase - Comment --  CHL IP PHARYNGEAL PHASE 05/06/2018 Pharyngeal Phase Impaired Pharyngeal- Pudding Teaspoon -- Pharyngeal -- Pharyngeal- Pudding Cup -- Pharyngeal -- Pharyngeal- Honey Teaspoon -- Pharyngeal -- Pharyngeal- Honey Cup -- Pharyngeal -- Pharyngeal- Nectar Teaspoon Delayed swallow initiation-vallecula Pharyngeal -- Pharyngeal- Nectar Cup Delayed swallow initiation-vallecula Pharyngeal -- Pharyngeal- Nectar Straw Delayed swallow initiation-vallecula;Penetration/Aspiration during swallow Pharyngeal Material enters airway, remains ABOVE vocal cords and not ejected out Pharyngeal- Thin Teaspoon Delayed swallow initiation-pyriform sinuses;Penetration/Aspiration before swallow Pharyngeal Material enters airway, passes  BELOW cords without attempt by patient to eject out (silent aspiration) Pharyngeal- Thin Cup Delayed swallow initiation-pyriform sinuses;Penetration/Aspiration before swallow;Penetration/Aspiration during swallow Pharyngeal Material enters airway, passes BELOW cords and not ejected out despite cough attempt by patient Pharyngeal- Thin Straw -- Pharyngeal -- Pharyngeal- Puree WFL Pharyngeal -- Pharyngeal- Mechanical Soft WFL Pharyngeal -- Pharyngeal- Regular -- Pharyngeal -- Pharyngeal- Multi-consistency -- Pharyngeal -- Pharyngeal- Pill -- Pharyngeal -- Pharyngeal Comment --  No flowsheet data found. Macario Golds 05/06/2018, 11:17 AM              Ct Maxillofacial Wo Contrast  Result  Date: 05/04/2018 CLINICAL DATA:  Initial evaluation for acute trauma, fall. EXAM: CT HEAD WITHOUT CONTRAST CT MAXILLOFACIAL WITHOUT CONTRAST CT CERVICAL SPINE WITHOUT CONTRAST TECHNIQUE: Multidetector CT imaging of the head, cervical spine, and maxillofacial structures were performed using the standard protocol without intravenous contrast. Multiplanar CT image reconstructions of the cervical spine and maxillofacial structures were also generated. COMPARISON:  Prior CT from 11/09/2017. FINDINGS: CT HEAD FINDINGS Brain: Generalized age-related cerebral atrophy with chronic small vessel ischemic disease. Mixed density acute on chronic right holo hemispheric subdural hematoma overlies the right cerebral convexity. This measures up to 13 mm in maximal thickness. Associated mass effect on the subjacent right cerebral hemisphere with up to 8 mm of right-to-left shift. Small volume subdural blood extends along the anterior and posterior falx and around the right temporal pole. Associated scattered posttraumatic subarachnoid hemorrhage with possible contusion within the underlying right temporal lobe. Associated small volume subarachnoid within the prepontine cistern. Trace hemorrhage seen along the septum pellucidum. Few small cortical contusions measuring up to 8 mm present at the high left frontal lobe near the vertex (series 3, image 33). Partial effacement of the right lateral ventricle without hydrocephalus or ventricular trapping. Basilar cisterns remain patent. Underlying atrophy with chronic microvascular ischemic disease. No acute large vessel territory infarct. No mass lesion. Vascular: No hyperdense vessel. Calcified atherosclerosis present at the skull base. Skull: Left parietal scalp contusion.  No calvarial fracture. Other: Right mastoid effusion, likely chronic. No temporal bone fracture. CT MAXILLOFACIAL FINDINGS Osseous: Chronic left-sided tripod fractures involving the left side of attic arch, lateral  left orbit, left orbital floor, and left maxilla noted. No definite acute component. Scattered foci of gas within the left pterygoid palatine fossa favored to be related to communication with the sinuses rather than acute fracture. Pterygoid plates intact. Maxilla otherwise intact. No acute nasal bone fracture. Right-to-left nasal septal deviation without fracture. No acute mandibular fracture. Mandibular condyles normally situated. Degenerative changes noted about the right TMJ. Patient is edentulous. Orbits: Globes and orbital soft tissues within normal limits. No retro-orbital hematoma. Patient status post bilateral ocular lens replacement. Sinuses: Left maxillary sinus opacified. Central hyperdensity favored to reflect desiccated secretions and/or superimposed fungal elements. Chronic left sphenoid sinusitis noted as well. Additional mild scattered mucosal thickening throughout the remainder of the paranasal sinuses. No definite hemosinus. Soft tissues: No appreciable soft tissue injury about the face. CT CERVICAL SPINE FINDINGS Alignment: Physiologic. Skull base and vertebrae: Skull base intact. Normal C1-2 articulations are preserved. Fracture lucencies extend through the left lateral mass of C2, somewhat age indeterminate, but could be acute (series 21, image 15). Involvement of the left transverse foramen. Vertebral body heights maintained. No other acute fracture. Soft tissues and spinal canal: Soft tissues of the neck demonstrate no acute finding. No abnormal prevertebral edema. Spinal canal demonstrates no acute finding. Prominent vascular calcifications about the carotid bifurcations. Disc levels: Prominent anterior  bulky osteophytic spurring noted at C4-5 through C7-T1. Multilevel facet arthrosis, most notable at C3-4 and C4-5 on the left. Central disc protrusions at C3-4 through C5-6 with up to moderate spinal stenosis. Upper chest: Visualized upper chest demonstrates no acute finding. Mild paraseptal  emphysema at the lung apices. Other: None. IMPRESSION: CT HEAD: 1. Large mixed density right holo hemispheric subdural hematoma measuring up to 13 mm in maximal diameter. Secondary mass effect on the right cerebral hemisphere with up to 8 mm right-to-left midline shift. No hydrocephalus or ventricular trapping. 2. Associated posttraumatic subarachnoid hemorrhage and/or contusion within the underlying right temporal lobe, with small volume subarachnoid within the basilar cisterns. 3. Few small cortical contusions at the high left frontal lobe near the vertex. 4. Left parietal scalp contusion.  No calvarial fracture. CT MAXILLOFACIAL: 1. No acute maxillofacial injury identified. 2. Chronic left facial tripod fracture as above. 3. Chronic left maxillary and sphenoid sinusitis. CT CERVICAL SPINE: 1. Fracture lucencies extending through the left lateral mass of C2, age indeterminate, and could be chronic in nature, although an acute component not entirely excluded. Correlation with physical exam recommended. Additionally, further assessment with dedicated MRI to evaluate for associated marrow edema could be performed for further evaluation. Please note that this fracture does involve the left C2 transverse foramen. Further assessment with dedicated CTA suggested to evaluate for possible vascular injury. 2. No other acute traumatic injury within the cervical spine. 3. Central disc protrusions at C3-4 through C5-6 with resultant mild to moderate spinal stenosis. Critical Value/emergent results were called by telephone at the time of interpretation on 05/04/2018 at 4:51 am to Dr. Ripley Fraise , who verbally acknowledged these results. Electronically Signed   By: Jeannine Boga M.D.   On: 05/04/2018 04:54    Microbiology: No results found for this or any previous visit (from the past 240 hour(s)).   Labs: Basic Metabolic Panel: Recent Labs  Lab 05/19/18 0511 05/20/18 0341 05/21/18 0533 05/23/18 0046  NA  135 130* 133* 132*  K 4.7 4.6 5.0 4.2  CL 100 96* 96* 98  CO2 25 25 26 25   GLUCOSE 200* 198* 198* 180*  BUN 28* 31* 29* 25*  CREATININE 1.28* 1.13 0.92 0.92  CALCIUM 8.8* 8.5* 8.8* 8.7*   Liver Function Tests: No results for input(s): AST, ALT, ALKPHOS, BILITOT, PROT, ALBUMIN in the last 168 hours. No results for input(s): LIPASE, AMYLASE in the last 168 hours. Recent Labs  Lab 05/20/18 1337  AMMONIA 40*   CBC: Recent Labs  Lab 05/19/18 0511 05/20/18 0341 05/21/18 0533 05/23/18 0046  WBC 6.2 5.4 5.1 5.8  HGB 13.5 12.7* 12.2* 12.0*  HCT 38.0* 36.1* 34.9* 34.2*  MCV 92.0 92.3 92.1 90.5  PLT 106* 113* 111* 127*   Cardiac Enzymes: No results for input(s): CKTOTAL, CKMB, CKMBINDEX, TROPONINI in the last 168 hours. BNP: BNP (last 3 results) No results for input(s): BNP in the last 8760 hours.  ProBNP (last 3 results) No results for input(s): PROBNP in the last 8760 hours.  CBG: Recent Labs  Lab 05/23/18 1928 05/23/18 2336 05/24/18 0343 05/24/18 0726 05/24/18 1148  GLUCAP 140* 169* 139* 107* 150*       Signed:  Domenic Polite MD.  [facility] 05/24/2018, 4:22 PM

## 2018-05-24 NOTE — Progress Notes (Signed)
Pt alert and oriented to person, RN and NT cleaned pt and emptied condom cath. Gave report to PTAR with current vital signs and blood sugar. Helped PTAR get pt on stretcher and gave PTAR all pt's belongings and paperwork.

## 2018-05-24 NOTE — Progress Notes (Signed)
Occupational Therapy Discharge Patient Details Name: [name] MRN: [id number] DOB: Aug 21, 1945 Today's Date: 05/24/2018 Time:  -     Patient discharged from [facility] secondary to Family has decided to transition to comfort care with pt transitioning to [facility].  Marland Kitchen  Please see latest therapy progress note for current level of functioning and progress toward goals.    Progress and discharge plan discussed with patient and/or caregiver: Patient unable to participate in discharge planning and no caregivers available.  Lucille Passy, OTR/L Acute Rehabilitation Services Pager [phone] Office [place], Joyann Spidle M 05/24/2018, 6:18 AM

## 2018-05-25 ENCOUNTER — Encounter: Payer: Self-pay | Admitting: Physician Assistant

## 2018-05-27 DIAGNOSIS — R404 Transient alteration of awareness: Secondary | ICD-10-CM | POA: Diagnosis not present

## 2018-06-06 DIAGNOSIS — 419620001 Death: Secondary | SNOMED CT | POA: Diagnosis not present

## 2018-06-06 DEATH — deceased

## 2020-04-05 IMAGING — CT CT HEAD WITHOUT CONTRAST
4 series · 16 of 47 positions shown, 18 images · non-contrast
Comparison: Multiple priors, 05/04/2018 and 05/05/2018

CLINICAL DATA: Intracranial hemorrhage, follow-up

EXAM:
CT HEAD WITHOUT CONTRAST
TECHNIQUE: Contiguous axial images were obtained from the base of the skull
through the vertex without intravenous contrast.

[Series 4: head bone · axial · 0.43mm/px · z∈[-85,-49]mm · 3 of 90 slices shown]
[im 9/90  bone]
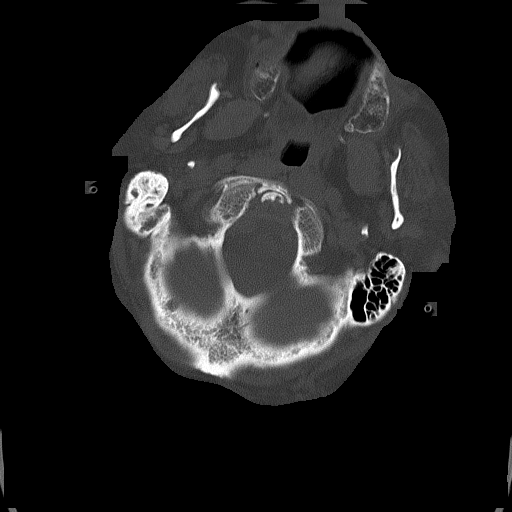
[im 18/90  bone]
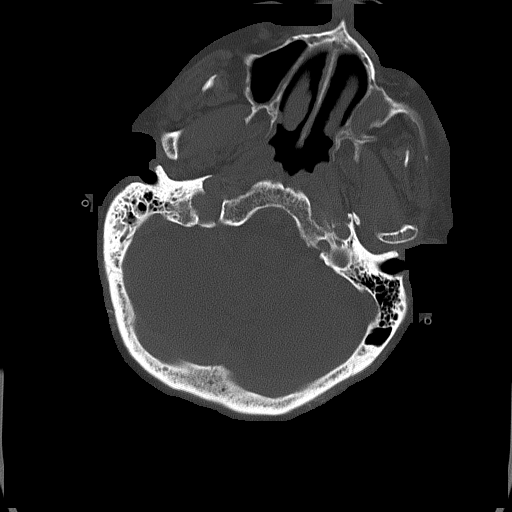
[im 27/90  bone]
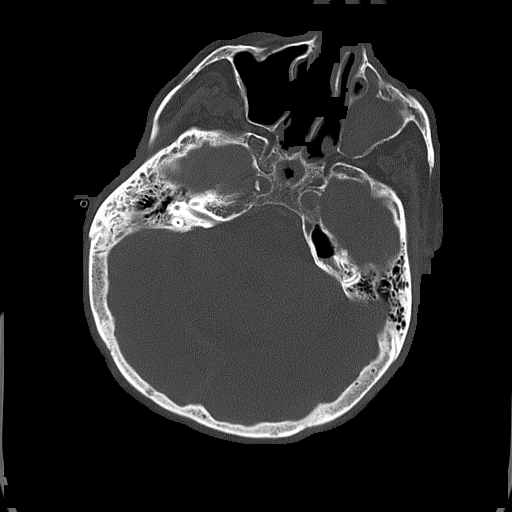

[Series 5: head without · axial · non-contrast · 0.39mm/px · z∈[-82,+48]mm · 7 of 36 slices shown, 9 images]
[im 5/36  brain]
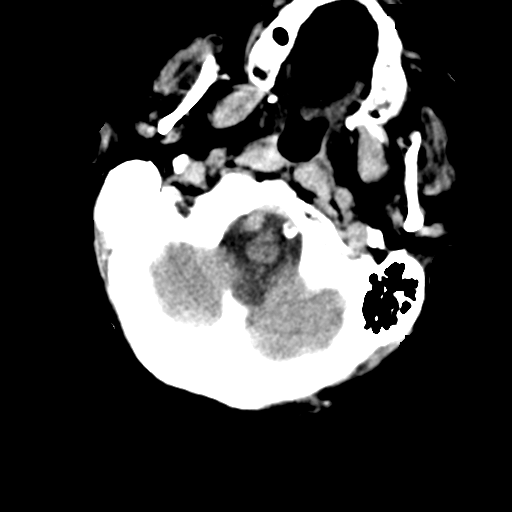
[im 5/36  bone]
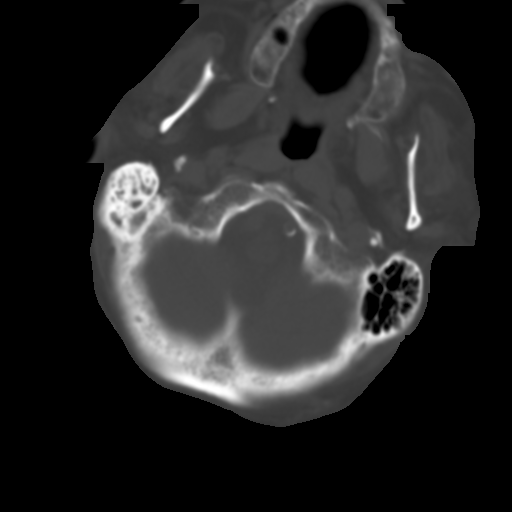
[im 9/36  brain]
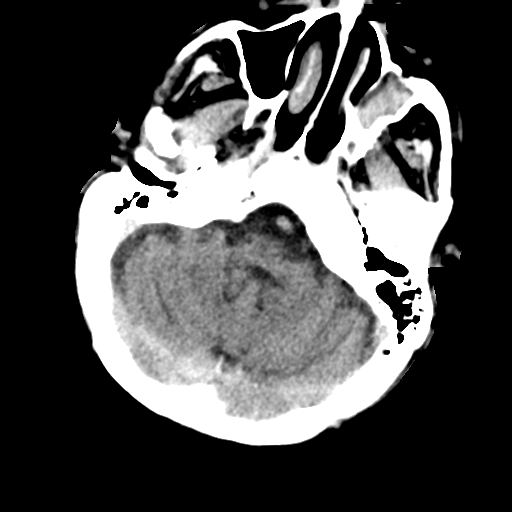
[im 14/36  brain]
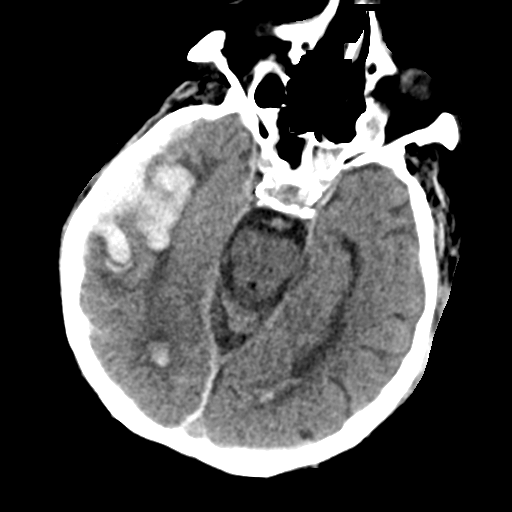
[im 18/36  brain]
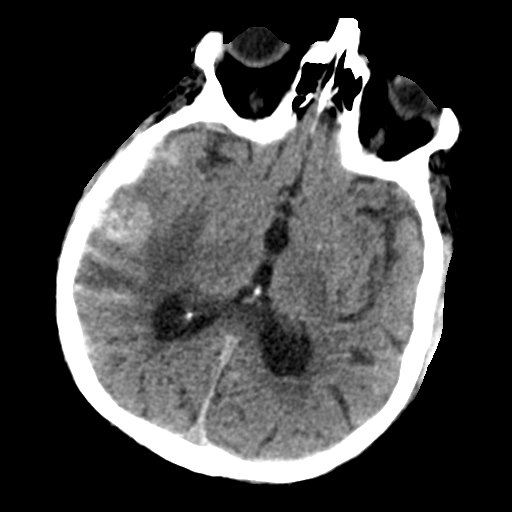
[im 22/36  brain]
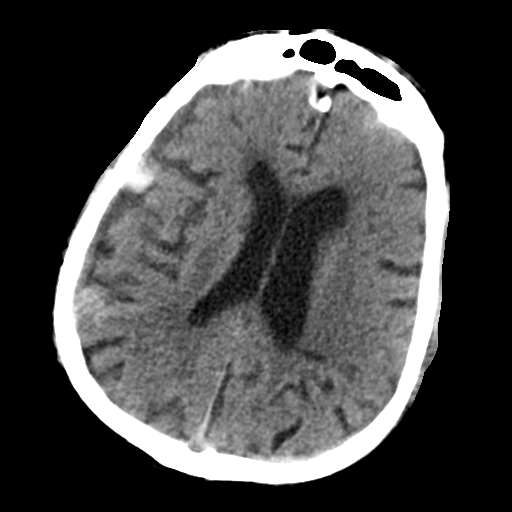
[im 22/36  bone]
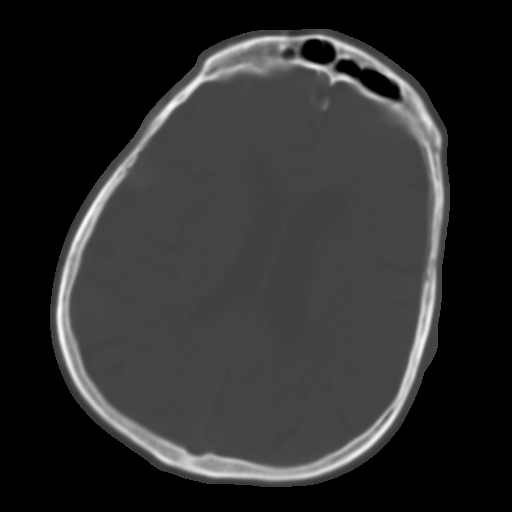
[im 27/36  brain]
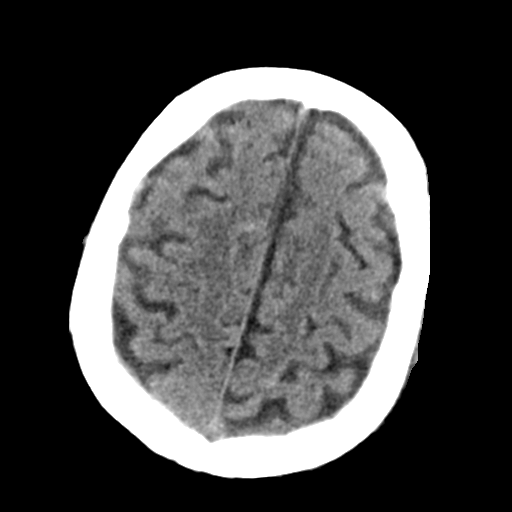
[im 31/36  brain]
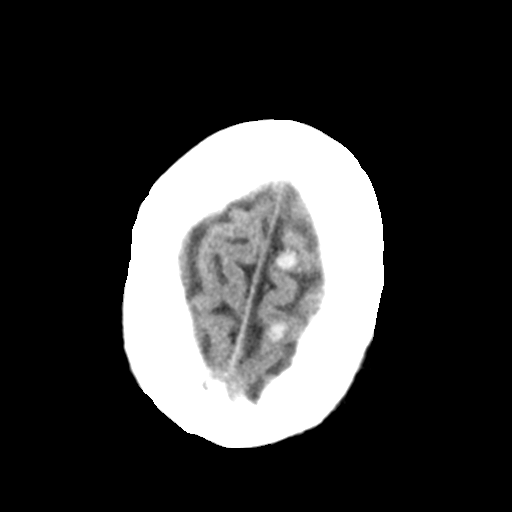

[Series 6: head without cor · coronal · non-contrast · 0.31mm/px · 3 of 67 slices shown]
[im 23/67  brain]
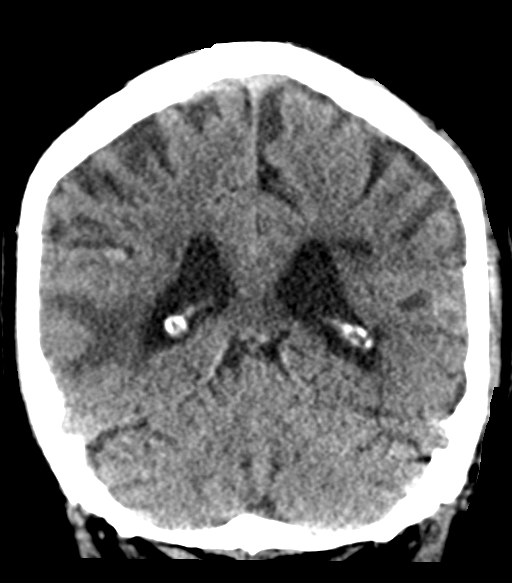
[im 30/67  brain]
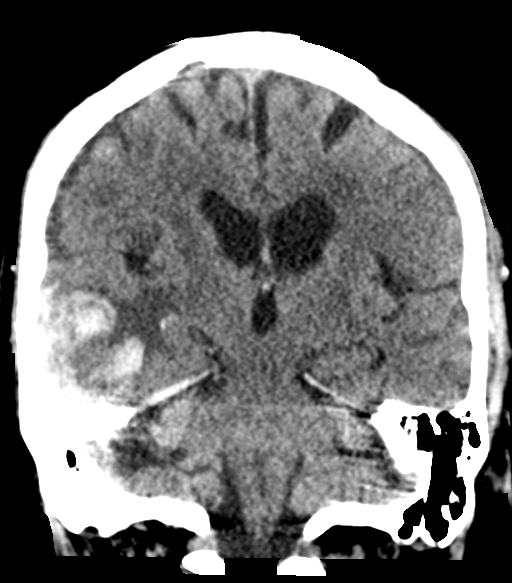
[im 37/67  brain]
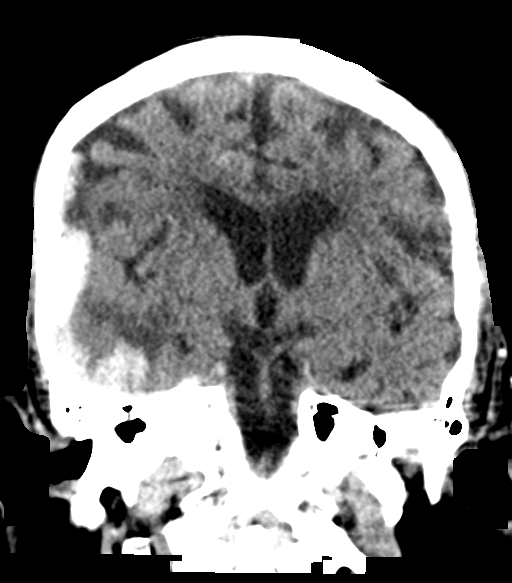

[Series 7: head without sag · sagittal · non-contrast · 0.36mm/px · 3 of 65 slices shown]
[im 22/65  brain]
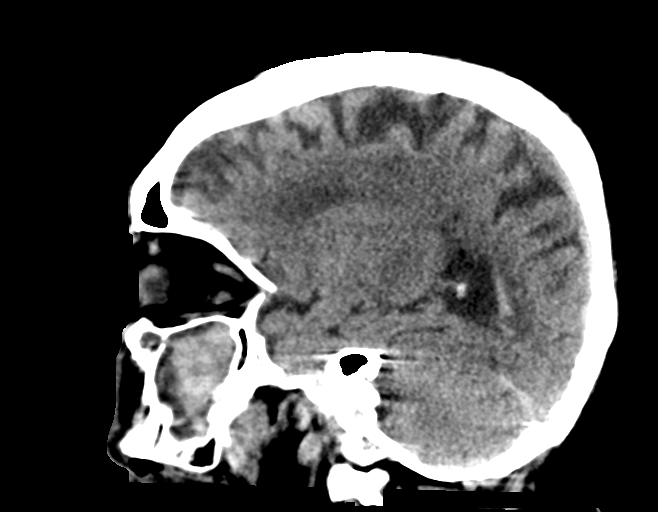
[im 33/65  brain]
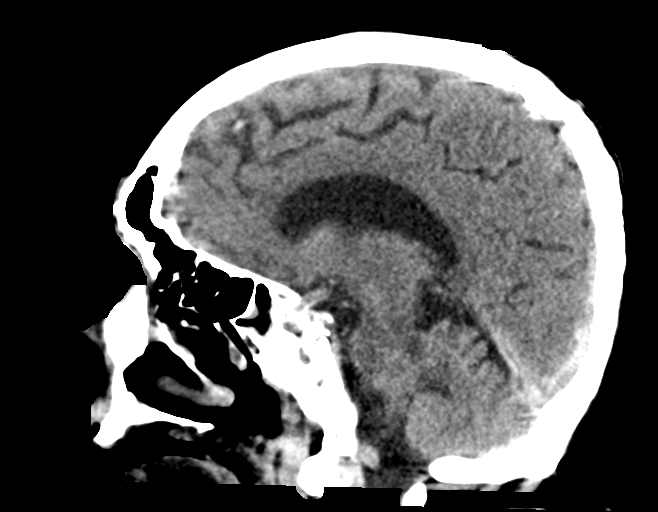
[im 43/65  brain]
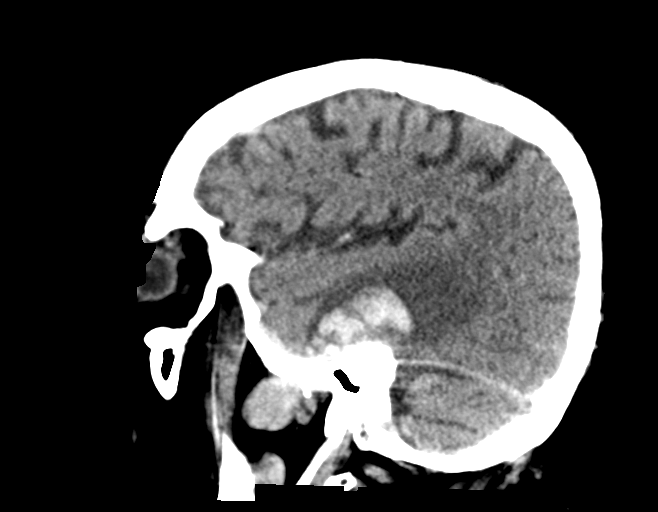

[16 of 47 positions shown; findings below may reference images not displayed]

FINDINGS: Brain: Modest increase in size of RIGHT temporal intraparenchymal
hemorrhage, now 26 x 54 mm, with mild surrounding edema, but
concomitant slight decrease in acute RIGHT subdural/epidural
extra-axial hematoma. No significant midline shift. Mild layering
intraventricular blood. Mild subarachnoid blood, slowly improving.

Small cortical shear injuries, LEFT frontal and LEFT posterior
frontal gyri, are stable to slightly improved.

Slight improvement in interhemispheric and tentorial subdural.

Vascular: Calcification of the cavernous internal carotid arteries
consistent with cerebrovascular atherosclerotic disease. No signs of
intracranial large vessel occlusion.

Skull: Calvarium intact.

Sinuses/Orbits: Mucosal thickening in the sphenoid. Some layering
fluid in the LEFT division. Chronic LEFT maxillary sinus opacity
with chronic tripod fracture, only partially healed. Negative
orbits.

Other: None.
IMPRESSION: 1. Only slight increase in size of RIGHT temporal intraparenchymal
hemorrhage, 26 x 54 mm, with mild surrounding edema. No significant
midline shift.
2. Slight improvement in extra-axial hematomas.
3. Stable to slightly improved LEFT frontal and LEFT posterior
frontal shear injuries.

## 2020-04-08 IMAGING — DX PORTABLE CHEST - 1 VIEW
1 series · 1 of 1 positions shown · non-contrast
Comparison: Chest x-ray dated 05/07/2018

CLINICAL DATA: Shortness of breath

EXAM:
PORTABLE CHEST 1 VIEW

[chest ap]
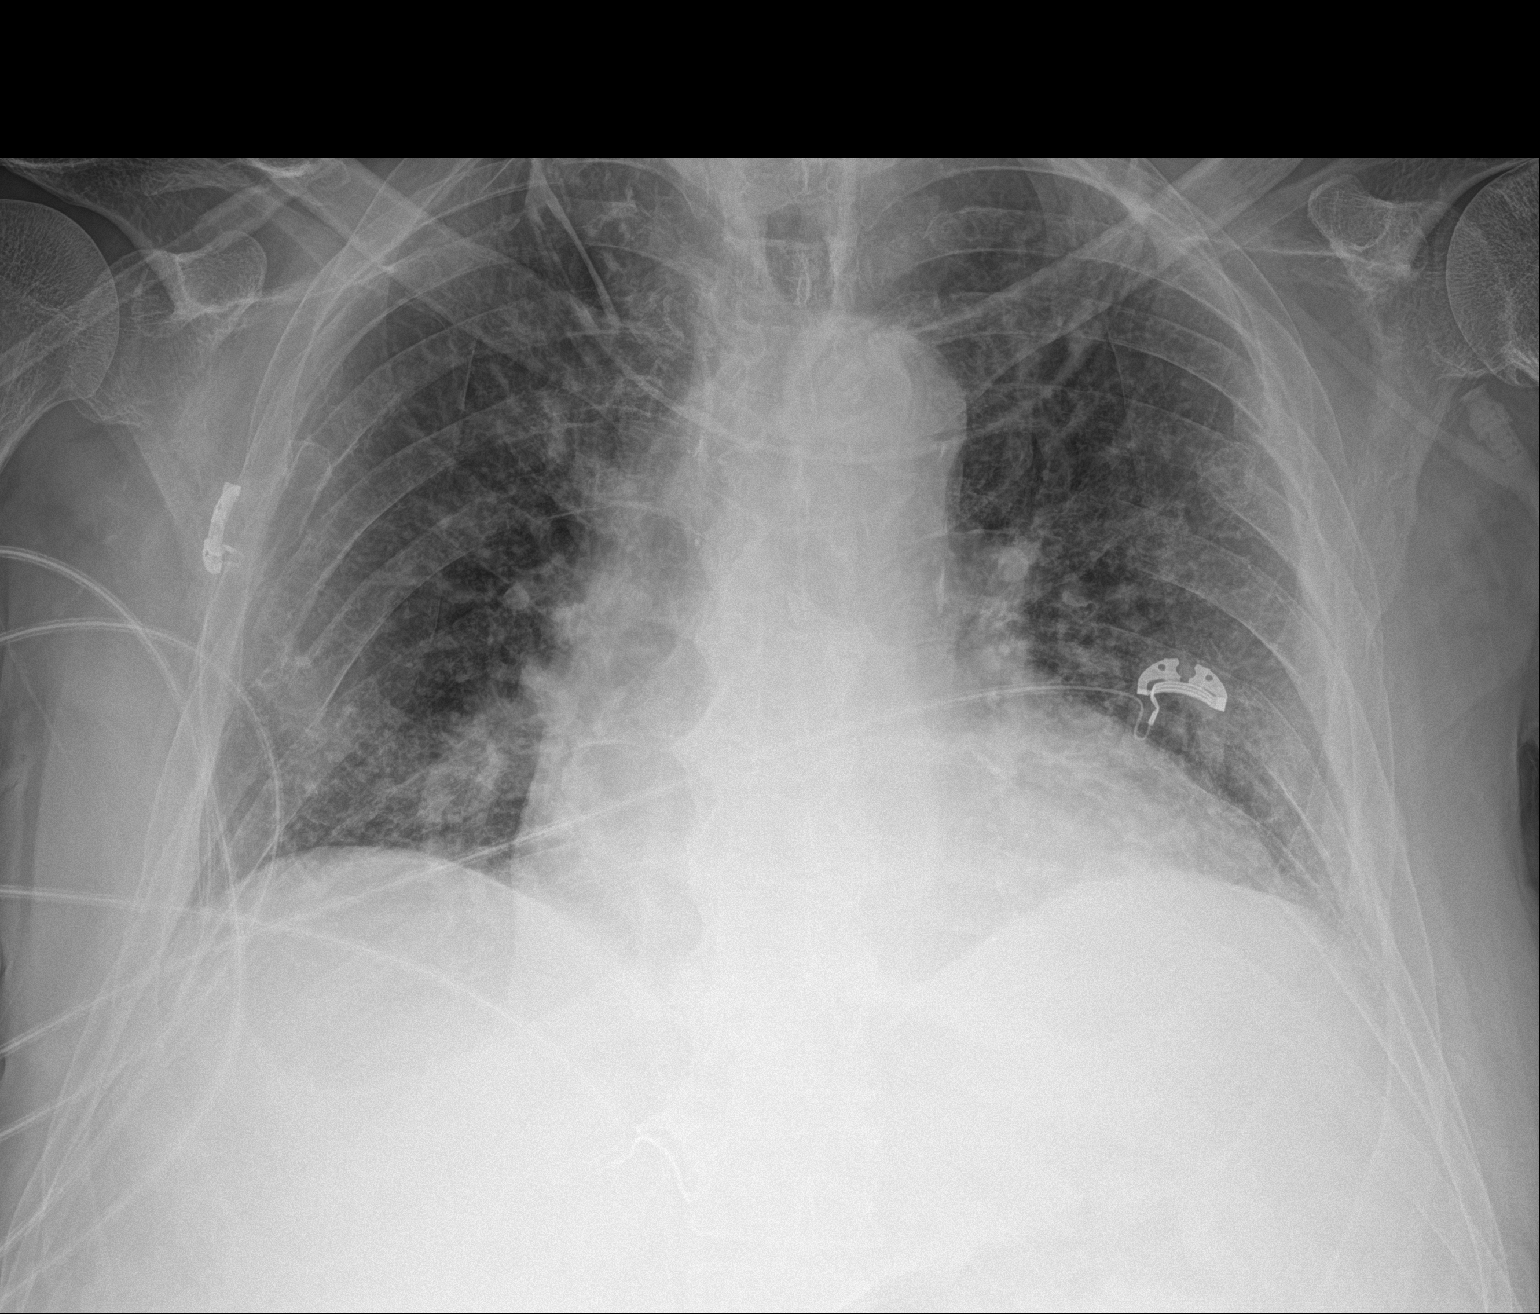

[1 of 1 positions shown; findings below may reference images not displayed]

FINDINGS: There is persistent significant cardiomegaly. The patient's
previously demonstrated pulmonary edema has improved somewhat since
the prior study. There is no pneumothorax. The lung volumes are low.
Small bilateral pleural effusions are again noted. Old healed
right-sided rib fractures are again noted. There are old healed
left-sided rib fractures. There is an old healed right-sided
clavicle fracture.
IMPRESSION: Cardiomegaly with improving pulmonary edema.
